# Patient Record
Sex: Female | Born: 1985
Health system: Southern US, Community
[De-identification: ages and names within clinical notes are randomized; demographics above are authoritative.]

## PROBLEM LIST (undated history)

## (undated) ENCOUNTER — Inpatient Hospital Stay (HOSPITAL_COMMUNITY): Payer: Self-pay

## (undated) DIAGNOSIS — D329 Benign neoplasm of meninges, unspecified: Secondary | ICD-10-CM

## (undated) DIAGNOSIS — F419 Anxiety disorder, unspecified: Secondary | ICD-10-CM

## (undated) DIAGNOSIS — D179 Benign lipomatous neoplasm, unspecified: Secondary | ICD-10-CM

## (undated) DIAGNOSIS — G40909 Epilepsy, unspecified, not intractable, without status epilepticus: Secondary | ICD-10-CM

## (undated) DIAGNOSIS — Z98891 History of uterine scar from previous surgery: Secondary | ICD-10-CM

## (undated) DIAGNOSIS — M25472 Effusion, left ankle: Secondary | ICD-10-CM

## (undated) DIAGNOSIS — R1902 Left upper quadrant abdominal swelling, mass and lump: Secondary | ICD-10-CM

## (undated) DIAGNOSIS — F32A Depression, unspecified: Secondary | ICD-10-CM

## (undated) DIAGNOSIS — M2550 Pain in unspecified joint: Secondary | ICD-10-CM

## (undated) DIAGNOSIS — Z91018 Allergy to other foods: Secondary | ICD-10-CM

## (undated) DIAGNOSIS — Z800 Family history of malignant neoplasm of digestive organs: Secondary | ICD-10-CM

## (undated) DIAGNOSIS — R51 Headache: Secondary | ICD-10-CM

## (undated) DIAGNOSIS — M549 Dorsalgia, unspecified: Secondary | ICD-10-CM

## (undated) DIAGNOSIS — R569 Unspecified convulsions: Secondary | ICD-10-CM

## (undated) DIAGNOSIS — K5900 Constipation, unspecified: Secondary | ICD-10-CM

## (undated) DIAGNOSIS — K219 Gastro-esophageal reflux disease without esophagitis: Secondary | ICD-10-CM

## (undated) DIAGNOSIS — Z803 Family history of malignant neoplasm of breast: Secondary | ICD-10-CM

## (undated) DIAGNOSIS — R519 Headache, unspecified: Secondary | ICD-10-CM

## (undated) DIAGNOSIS — R0602 Shortness of breath: Secondary | ICD-10-CM

## (undated) DIAGNOSIS — M25471 Effusion, right ankle: Secondary | ICD-10-CM

## (undated) DIAGNOSIS — R5383 Other fatigue: Secondary | ICD-10-CM

## (undated) DIAGNOSIS — M25475 Effusion, left foot: Secondary | ICD-10-CM

## (undated) HISTORY — DX: Effusion, right ankle: M25.475

## (undated) HISTORY — DX: Allergy to other foods: Z91.018

## (undated) HISTORY — DX: Dorsalgia, unspecified: M54.9

## (undated) HISTORY — DX: Headache, unspecified: R51.9

## (undated) HISTORY — DX: Left upper quadrant abdominal swelling, mass and lump: R19.02

## (undated) HISTORY — DX: Gastro-esophageal reflux disease without esophagitis: K21.9

## (undated) HISTORY — DX: Epilepsy, unspecified, not intractable, without status epilepticus: G40.909

## (undated) HISTORY — DX: Benign neoplasm of meninges, unspecified: D32.9

## (undated) HISTORY — DX: Constipation, unspecified: K59.00

## (undated) HISTORY — DX: Benign lipomatous neoplasm, unspecified: D17.9

## (undated) HISTORY — DX: Effusion, left ankle: M25.472

## (undated) HISTORY — DX: Pain in unspecified joint: M25.50

## (undated) HISTORY — PX: BRAIN SURGERY: SHX531

## (undated) HISTORY — DX: Headache: R51

## (undated) HISTORY — DX: Anxiety disorder, unspecified: F41.9

## (undated) HISTORY — DX: Shortness of breath: R06.02

## (undated) HISTORY — DX: Unspecified convulsions: R56.9

## (undated) HISTORY — DX: History of uterine scar from previous surgery: Z98.891

## (undated) HISTORY — DX: Family history of malignant neoplasm of breast: Z80.3

## (undated) HISTORY — DX: Family history of malignant neoplasm of digestive organs: Z80.0

## (undated) HISTORY — DX: Depression, unspecified: F32.A

## (undated) HISTORY — DX: Effusion, right ankle: M25.471

## (undated) HISTORY — DX: Other fatigue: R53.83

---
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
## 2007-07-20 ENCOUNTER — Emergency Department (HOSPITAL_COMMUNITY): Admission: EM | Admit: 2007-07-20 | Discharge: 2007-07-21 | Payer: Self-pay | Admitting: Emergency Medicine

## 2007-09-12 ENCOUNTER — Emergency Department (HOSPITAL_COMMUNITY): Admission: EM | Admit: 2007-09-12 | Discharge: 2007-09-12 | Payer: Self-pay | Admitting: Emergency Medicine

## 2007-10-08 ENCOUNTER — Emergency Department (HOSPITAL_COMMUNITY): Admission: EM | Admit: 2007-10-08 | Discharge: 2007-10-08 | Payer: Self-pay | Admitting: Emergency Medicine

## 2008-02-23 ENCOUNTER — Other Ambulatory Visit: Admission: RE | Admit: 2008-02-23 | Discharge: 2008-02-23 | Payer: Self-pay | Admitting: Family Medicine

## 2008-06-15 ENCOUNTER — Emergency Department (HOSPITAL_COMMUNITY): Admission: EM | Admit: 2008-06-15 | Discharge: 2008-06-15 | Payer: Self-pay | Admitting: Emergency Medicine

## 2008-08-29 ENCOUNTER — Other Ambulatory Visit: Admission: RE | Admit: 2008-08-29 | Discharge: 2008-08-29 | Payer: Self-pay | Admitting: Family Medicine

## 2009-03-11 ENCOUNTER — Emergency Department (HOSPITAL_COMMUNITY): Admission: EM | Admit: 2009-03-11 | Discharge: 2009-03-11 | Payer: Self-pay | Admitting: Emergency Medicine

## 2009-03-15 ENCOUNTER — Emergency Department (HOSPITAL_COMMUNITY): Admission: EM | Admit: 2009-03-15 | Discharge: 2009-03-15 | Payer: Self-pay | Admitting: Emergency Medicine

## 2009-04-13 ENCOUNTER — Emergency Department (HOSPITAL_COMMUNITY): Admission: EM | Admit: 2009-04-13 | Discharge: 2009-04-13 | Payer: Self-pay | Admitting: Emergency Medicine

## 2009-06-15 ENCOUNTER — Emergency Department (HOSPITAL_COMMUNITY): Admission: EM | Admit: 2009-06-15 | Discharge: 2009-06-15 | Payer: Self-pay | Admitting: Emergency Medicine

## 2009-12-19 ENCOUNTER — Emergency Department (HOSPITAL_COMMUNITY)
Admission: EM | Admit: 2009-12-19 | Discharge: 2009-12-19 | Payer: Self-pay | Source: Home / Self Care | Admitting: Family Medicine

## 2010-02-24 ENCOUNTER — Ambulatory Visit: Payer: Self-pay | Admitting: Gynecology

## 2010-02-24 ENCOUNTER — Inpatient Hospital Stay (HOSPITAL_COMMUNITY): Admission: AD | Admit: 2010-02-24 | Discharge: 2010-02-24 | Payer: Self-pay | Admitting: Obstetrics and Gynecology

## 2010-05-19 ENCOUNTER — Inpatient Hospital Stay (HOSPITAL_COMMUNITY): Admission: AD | Admit: 2010-05-19 | Discharge: 2010-05-19 | Payer: Self-pay | Admitting: Obstetrics

## 2010-09-06 ENCOUNTER — Inpatient Hospital Stay (HOSPITAL_COMMUNITY)
Admission: AD | Admit: 2010-09-06 | Discharge: 2010-09-06 | Payer: Self-pay | Source: Home / Self Care | Attending: Obstetrics | Admitting: Obstetrics

## 2010-09-08 ENCOUNTER — Inpatient Hospital Stay (HOSPITAL_COMMUNITY)
Admission: AD | Admit: 2010-09-08 | Discharge: 2010-09-14 | Payer: Self-pay | Source: Home / Self Care | Attending: Obstetrics and Gynecology | Admitting: Obstetrics and Gynecology

## 2010-09-14 ENCOUNTER — Encounter: Admission: RE | Admit: 2010-09-14 | Payer: Self-pay | Source: Home / Self Care | Admitting: Certified Nurse Midwife

## 2010-09-14 LAB — CBC
HCT: 39.7 % (ref 36.0–46.0)
HCT: 33.7 % — ABNORMAL LOW (ref 36.0–46.0)
HCT: 34.6 % — ABNORMAL LOW (ref 36.0–46.0)
HCT: 31.7 % — ABNORMAL LOW (ref 36.0–46.0)
Hemoglobin: 13.5 g/dL (ref 12.0–15.0)
Hemoglobin: 11.3 g/dL — ABNORMAL LOW (ref 12.0–15.0)
Hemoglobin: 11.5 g/dL — ABNORMAL LOW (ref 12.0–15.0)
Hemoglobin: 10.5 g/dL — ABNORMAL LOW (ref 12.0–15.0)
MCH: 31.8 pg (ref 26.0–34.0)
MCH: 31.7 pg (ref 26.0–34.0)
MCH: 31 pg (ref 26.0–34.0)
MCH: 30.9 pg (ref 26.0–34.0)
MCHC: 34 g/dL (ref 30.0–36.0)
MCHC: 33.5 g/dL (ref 30.0–36.0)
MCHC: 33.2 g/dL (ref 30.0–36.0)
MCHC: 33.1 g/dL (ref 30.0–36.0)
MCV: 93.6 fL (ref 78.0–100.0)
MCV: 94.7 fL (ref 78.0–100.0)
MCV: 93.3 fL (ref 78.0–100.0)
MCV: 93.2 fL (ref 78.0–100.0)
Platelets: 352 10*3/uL (ref 150–400)
Platelets: 276 10*3/uL (ref 150–400)
Platelets: 315 10*3/uL (ref 150–400)
Platelets: 358 10*3/uL (ref 150–400)
RBC: 4.24 MIL/uL (ref 3.87–5.11)
RBC: 3.56 MIL/uL — ABNORMAL LOW (ref 3.87–5.11)
RBC: 3.71 MIL/uL — ABNORMAL LOW (ref 3.87–5.11)
RBC: 3.4 MIL/uL — ABNORMAL LOW (ref 3.87–5.11)
RDW: 14 % (ref 11.5–15.5)
RDW: 14 % (ref 11.5–15.5)
RDW: 14.1 % (ref 11.5–15.5)
RDW: 14 % (ref 11.5–15.5)
WBC: 9.6 10*3/uL (ref 4.0–10.5)
WBC: 10.9 10*3/uL — ABNORMAL HIGH (ref 4.0–10.5)
WBC: 16 10*3/uL — ABNORMAL HIGH (ref 4.0–10.5)
WBC: 10.9 10*3/uL — ABNORMAL HIGH (ref 4.0–10.5)

## 2010-09-14 LAB — URINE CULTURE
Colony Count: NO GROWTH
Culture  Setup Time: 201201121915
Culture: NO GROWTH
Special Requests: NEGATIVE

## 2010-09-14 LAB — URINALYSIS, MICROSCOPIC ONLY
Bilirubin Urine: NEGATIVE
Ketones, ur: NEGATIVE mg/dL
Nitrite: NEGATIVE
Protein, ur: NEGATIVE mg/dL
Specific Gravity, Urine: 1.01 (ref 1.005–1.030)
Urine Glucose, Fasting: NEGATIVE mg/dL
Urobilinogen, UA: 0.2 mg/dL (ref 0.0–1.0)
pH: 7 (ref 5.0–8.0)

## 2010-09-14 LAB — DIFFERENTIAL
Basophils Absolute: 0 10*3/uL (ref 0.0–0.1)
Basophils Relative: 0 % (ref 0–1)
Eosinophils Absolute: 0.2 10*3/uL (ref 0.0–0.7)
Eosinophils Relative: 2 % (ref 0–5)
Lymphocytes Relative: 12 % (ref 12–46)
Lymphs Abs: 1.3 10*3/uL (ref 0.7–4.0)
Monocytes Absolute: 1.1 10*3/uL — ABNORMAL HIGH (ref 0.1–1.0)
Monocytes Relative: 10 % (ref 3–12)
Neutro Abs: 8.4 10*3/uL — ABNORMAL HIGH (ref 1.7–7.7)
Neutrophils Relative %: 77 % (ref 43–77)

## 2010-09-14 LAB — VANCOMYCIN, TROUGH: Vancomycin Tr: 6.8 ug/mL — ABNORMAL LOW (ref 10.0–20.0)

## 2010-09-14 LAB — GENTAMICIN LEVEL, RANDOM: Gentamicin Rm: 1.1 ug/mL

## 2010-09-14 LAB — CREATININE, SERUM
Creatinine, Ser: 0.68 mg/dL (ref 0.4–1.2)
GFR calc Af Amer: >60 mL/min (ref >60)
GFR calc non Af Amer: >60 mL/min (ref >60)

## 2010-09-14 LAB — RPR: RPR Ser Ql: NONREACTIVE

## 2010-09-16 LAB — CBC
HCT: 30.9 % — ABNORMAL LOW (ref 36.0–46.0)
Hemoglobin: 10.4 g/dL — ABNORMAL LOW (ref 12.0–15.0)
MCH: 31.1 pg (ref 26.0–34.0)
MCHC: 33.7 g/dL (ref 30.0–36.0)
MCV: 92.5 fL (ref 78.0–100.0)
Platelets: 386 10*3/uL (ref 150–400)
RBC: 3.34 MIL/uL — ABNORMAL LOW (ref 3.87–5.11)
RDW: 13.8 % (ref 11.5–15.5)
WBC: 9 10*3/uL (ref 4.0–10.5)

## 2010-09-16 LAB — DIFFERENTIAL
Basophils Absolute: 0 10*3/uL (ref 0.0–0.1)
Basophils Relative: 0 % (ref 0–1)
Eosinophils Absolute: 0.2 10*3/uL (ref 0.0–0.7)
Eosinophils Relative: 2 % (ref 0–5)
Lymphocytes Relative: 22 % (ref 12–46)
Lymphs Abs: 2 10*3/uL (ref 0.7–4.0)
Monocytes Absolute: 1 10*3/uL (ref 0.1–1.0)
Monocytes Relative: 11 % (ref 3–12)
Neutro Abs: 5.9 10*3/uL (ref 1.7–7.7)
Neutrophils Relative %: 66 % (ref 43–77)

## 2010-10-22 NOTE — Discharge Summary (Signed)
NAME:  Kendra [name], Kendra [name]                 ACCOUNT NO.:  [id number]  MEDICAL RECORD NO.:  [id number]          PATIENT TYPE:  INP  LOCATION:  9102                          FACILITY:  [facility]  PHYSICIAN:  Darryl Nestle, MD     DATE OF BIRTH:  15-Nov-1985  DATE OF ADMISSION:  09/08/2010 DATE OF DISCHARGE:  09/14/2010                              DISCHARGE SUMMARY   ADMISSION DIAGNOSIS:  A 41 weeks' pregnancy and latent labor.  DISCHARGE DIAGNOSIS:  Status post primary cesarean section for arrest of dilatation, postop #5 endometritis.  PATIENT HISTORY:  The patient is a gravida 1, para 0 at 41 weeks and 1- day gestation with an EDC of August 31, 2010.  Prenatal care at [facility] since 9 weeks, primary was Marlinda Mike, CNM.  PRENATAL LABS:  A positive, rubella immune, GBS negative, HIV negative, RPR negative, hepatis negative, and 1-hour GTT normal.  Prenatal course was complicated by obesity and postdates.  MEDICAL-SURGICAL HISTORY:  Obesity and asthma.  ALLERGIES:  ASA, PENICILLIN, IODINE, ERYTHROMYCIN, and MOTRIN.  CURRENT MEDICATIONS:  Prenatal vitamins, Tylenol, and Tums.  PRESENTATION:  The patient presented with intermittent contractions and reactive fetal heart tones, positive brown vaginal discharge and a cervical dilatation of 1-2, 70, and -2 and vertex.  Admit labs were WBC is 11.6, hemoglobin 13.2, hematocrit 38.3, platelets 161, and RPR was nonreactive.  INTRAPARTUM COURSE SUMMARY:  The patient was admitted for augmentation of labor with cervical balloon at 2200 on September 08, 2010.  At 5:30 a.m. on January 11, the patient was noted to have clear fluid leaking, the patient underwent AROM of forebag at 8:30 that morning with an epidural placement, IUPC, and Pitocin augmentation.  At 1400, vital signs were stable.  The patient was afebrile and fetal heart rate reassuring.  No cervical change despite adequate contractions documented x4 hours.  The patient was also found to have thin  meconium.  Decision was made to progress with a C-section.  Delivery, the patient was delivered via primary C-section on September 09, 2010, by Dr. Juliene Pina. Indication was arrest of dilatation at 7 cm.  The patient was delivered of a female infant 7 pounds 3 ounces, Apgars 9 and 9.  Newborn was transferred to regular nursery.  Postop course was complicated by a hectic fever curve, the patient was treated with double antibiotics x2 days with an addition of the third antibiotics for 2 more days and switched to oral antibiotics on last day before discharge.  Postop labs, WBC is 10.9, hemoglobin 11.3, hematocrit 33.7, platelets 276, maximum leukocytosis was 16.0 on day 2.  On discharge day, WBCs were 9.0.  Vital signs were stable at discharge.  The patient was afebrile at time of discharge, T-max was 100.4 in the previous 24 hours.  Physical exam was normal with exception of dependent edema generalized, wound was approximated with staples.  No erythema and no ecchymosis.  Newborn was breastfed throughout hospital stay. DISCHARGE CARE AND INSTRUCTIONS:  The patient was discharged home with improved status, staples in place to be removed on postop day #7 at [facility].  DIET:  Regular.  ACTIVITY:  Postop restrictions x2 weeks with weight lifting restrictions.  INSTRUCTIONS:  Per [facility] booklet.  DISCHARGE MEDICATIONS: 1. Prenatal vitamins 1 tablet daily. 2. Tylox 1-2 tablets p.o. q.4-6 h. p.r.n. 3. Augmentin 500 mg q.8 h. x7 days. 4. Hydrochlorothiazide 12.5 mg daily x2 days.  Follow up in 2 days for staple removal and 6 weeks for postpartum care.    ______________________________ Arlan Organ, CNM   ______________________________ Darryl Nestle, MD    DP/MEDQ  D:  09/14/2010  T:  09/15/2010  Job:  [id number]  Electronically Signed by Arlan Organ CNM on 10/12/2010 01:05:12 PM Electronically Signed by Susa Day Adelee Hannula  on 10/22/2010 10:36:34 AM

## 2010-11-16 LAB — URINALYSIS, ROUTINE W REFLEX MICROSCOPIC
Bilirubin Urine: NEGATIVE
Glucose, UA: NEGATIVE mg/dL
Hgb urine dipstick: NEGATIVE
Ketones, ur: NEGATIVE mg/dL
Nitrite: NEGATIVE
Protein, ur: NEGATIVE mg/dL
Specific Gravity, Urine: 1.02 (ref 1.005–1.030)
Urobilinogen, UA: 0.2 mg/dL (ref 0.0–1.0)
pH: 7 (ref 5.0–8.0)

## 2010-11-16 LAB — URINE MICROSCOPIC-ADD ON

## 2010-11-17 LAB — POCT URINALYSIS DIP (DEVICE)
Bilirubin Urine: NEGATIVE
Glucose, UA: NEGATIVE mg/dL
Ketones, ur: NEGATIVE mg/dL
Nitrite: NEGATIVE
Protein, ur: NEGATIVE mg/dL
Specific Gravity, Urine: >=1.03 (ref 1.005–1.030)
Urobilinogen, UA: 1 mg/dL (ref 0.0–1.0)
pH: 6 (ref 5.0–8.0)

## 2010-11-17 LAB — POCT PREGNANCY, URINE: Preg Test, Ur: NEGATIVE

## 2010-12-05 LAB — POCT PREGNANCY, URINE: Preg Test, Ur: NEGATIVE

## 2010-12-06 ENCOUNTER — Emergency Department (HOSPITAL_COMMUNITY)
Admission: EM | Admit: 2010-12-06 | Discharge: 2010-12-07 | Disposition: A | Discharge status: Home / Self Care | Payer: 59 | Attending: Emergency Medicine | Admitting: Emergency Medicine

## 2010-12-06 DIAGNOSIS — L509 Urticaria, unspecified: Secondary | ICD-10-CM | POA: Insufficient documentation

## 2010-12-06 DIAGNOSIS — J45909 Unspecified asthma, uncomplicated: Secondary | ICD-10-CM | POA: Insufficient documentation

## 2010-12-06 DIAGNOSIS — L2989 Other pruritus: Secondary | ICD-10-CM | POA: Insufficient documentation

## 2010-12-06 DIAGNOSIS — R21 Rash and other nonspecific skin eruption: Secondary | ICD-10-CM | POA: Insufficient documentation

## 2010-12-06 DIAGNOSIS — L272 Dermatitis due to ingested food: Secondary | ICD-10-CM | POA: Insufficient documentation

## 2010-12-06 DIAGNOSIS — L298 Other pruritus: Secondary | ICD-10-CM | POA: Insufficient documentation

## 2010-12-06 LAB — PREGNANCY, URINE: Preg Test, Ur: NEGATIVE

## 2011-01-08 ENCOUNTER — Other Ambulatory Visit: Payer: Self-pay | Admitting: Family Medicine

## 2011-01-08 ENCOUNTER — Ambulatory Visit
Admission: RE | Admit: 2011-01-08 | Discharge: 2011-01-08 | Disposition: A | Discharge status: Home / Self Care | Payer: 59 | Source: Ambulatory Visit | Attending: Family Medicine | Admitting: Family Medicine

## 2011-01-08 DIAGNOSIS — R52 Pain, unspecified: Secondary | ICD-10-CM

## 2011-05-31 LAB — POCT PREGNANCY, URINE: Preg Test, Ur: NEGATIVE

## 2011-08-31 HISTORY — PX: LYMPH NODE DISSECTION: SHX5087

## 2011-11-24 ENCOUNTER — Encounter: Payer: Self-pay | Admitting: Obstetrics and Gynecology

## 2011-12-03 ENCOUNTER — Encounter: Payer: 59 | Admitting: Registered Nurse

## 2011-12-03 ENCOUNTER — Encounter: Payer: Self-pay | Admitting: Obstetrics and Gynecology

## 2011-12-13 ENCOUNTER — Encounter: Payer: Self-pay | Admitting: Obstetrics and Gynecology

## 2011-12-13 ENCOUNTER — Ambulatory Visit (INDEPENDENT_AMBULATORY_CARE_PROVIDER_SITE_OTHER): Payer: 59 | Admitting: Obstetrics and Gynecology

## 2011-12-13 VITALS — BP 104/72 | Temp 99.0°F | Ht 60.5 in | Wt 199.0 lb

## 2011-12-13 DIAGNOSIS — Z124 Encounter for screening for malignant neoplasm of cervix: Secondary | ICD-10-CM

## 2011-12-13 DIAGNOSIS — A749 Chlamydial infection, unspecified: Secondary | ICD-10-CM

## 2011-12-13 DIAGNOSIS — Z9889 Other specified postprocedural states: Secondary | ICD-10-CM

## 2011-12-13 DIAGNOSIS — Z01419 Encounter for gynecological examination (general) (routine) without abnormal findings: Secondary | ICD-10-CM

## 2011-12-13 DIAGNOSIS — R198 Other specified symptoms and signs involving the digestive system and abdomen: Secondary | ICD-10-CM

## 2011-12-13 DIAGNOSIS — Z98891 History of uterine scar from previous surgery: Secondary | ICD-10-CM

## 2011-12-13 HISTORY — PX: ABDOMINAL SURGERY: SHX537

## 2011-12-13 NOTE — Patient Instructions (Signed)
Follow up as scheduled with general surgeon.  If you can't make your appointment, please call and reschedule/cancel

## 2011-12-13 NOTE — Progress Notes (Signed)
Subjective:    Kendra [name] is a 26 y.o. female, G1P1001, who presents for an annual exam. Patient complains of a several year history of her right abdomen having a "big lump".  States that since her delivery in January 2012 the area has gotten bigger.  Denies any pain associated with this area or other complaints.    History   Social History  . Marital Status: Single    Spouse Name: N/A    Number of Children: N/A  . Years of Education: N/A   Social History Main Topics  . Smoking status: Current Some Day Smoker    Types: Cigarettes  . Smokeless tobacco: Never Used  . Alcohol Use: Yes     occasionally  . Drug Use: Yes    Special: Marijuana  . Sexually Active: Yes -- Female partner(s)    Birth Control/ Protection: Condom   Other Topics Concern  . None   Social History Narrative  . None    Last mammogram: not applicable   Last pap: was normal  2012  Menstrual cycle:   LMP: Patient's last menstrual period was 12/10/2011.  per patient "normal" flow and minimal cramping  Review of Systems Pertinent items are noted in HPI. Denies pelvic pain, uti symptoms, vaginitis symptoms, irregular bleeding, menopausal symptoms or rectal bleeding   Objective:    BP 104/72  Temp(Src) 99 F (37.2 C) (Oral)  Ht 5' 0.5" (1.537 m)  Wt 199 lb (90.266 kg)  BMI 38.22 kg/m2  LMP 12/10/2011 Weight:  Wt Readings from Last 1 Encounters:  12/13/11 199 lb (90.266 kg)   BMI: Body mass index is 38.22 kg/(m^2). General Appearance: Alert, appropriate appearance for age. No acute distress HEENT: Grossly normal Neck / Thyroid: Supple, no masses, nodes or enlargement Lungs: clear to auscultation bilaterally Back: No CVA tenderness Breast Exam:No masses or nodes.No dimpling, nipple retraction or discharge. Cardiovascular: Regular rate and rhythm. S1, S2, no murmur Gastrointestinal: Soft, non-tender, no masses or organomegaly, however there is markedly noticeable asymmetry of right side of abdomen  compared with the left, especially when the patient stands;  not discernable mass palpated. Pelvic Exam: External genitalia: normal general appearance Vaginal: normal rugae Cervix: normal appearance Adnexa: normal bimanual exam Uterus: normal single, nontender Exam limited by body habitus Rectovaginal: not indicated Lymphatic Exam: Non-palpable nodes in neck, clavicular, axillary, or inguinal regions Skin: no rash or abnormalities Extremities: no redness or tenderness in the calves or thighs, no edema,  negative Homan's  Neurologic: grossly normal Psychiatric: Alert and oriented, appropriate affect.     Assessment:    Normal gyn exam Right Abdominal Asymmetry    Plan:  Refer to general surgeon for evaluation of abdominal asymmetry  pap smear return annually or prn       Kevin Mario,ELMIRAPA-C

## 2011-12-15 ENCOUNTER — Telehealth: Payer: Self-pay

## 2011-12-15 LAB — PAP IG, CT-NG, RFX HPV ASCU: GC Probe Amp: NEGATIVE

## 2011-12-15 MED ORDER — AZITHROMYCIN 500 MG PO TABS: 1000.0000 mg | ORAL_TABLET | Freq: Once | ORAL | Status: AC

## 2011-12-15 NOTE — Telephone Encounter (Signed)
Message copied by Winfred Leeds on Wed Dec 15, 2011  1:34 PM ------      Message from: Henreitta Leber      Created: Tue Dec 14, 2011  5:46 PM      Regarding: STD Protocol       Patient with a  + chlamydia.  Please initiate STD Protocol. Patient may be treated with Zithromzx 500 mg 2 tablets po stat  no refills.

## 2011-12-15 NOTE — Telephone Encounter (Signed)
CALL PT ABOUT POSITIVE  CT. WENT OVER  STD PROTOCOL WITH PT AND SCHEDULED APPOINTMENT FOR MAY 8 AT 9:00 AM TO DO TOC FOR CT. PT VOICED UNDERSTANDING. WILL CALL IN RX FOR PT.

## 2011-12-15 NOTE — Telephone Encounter (Signed)
Lm for pt to call back

## 2011-12-27 ENCOUNTER — Encounter: Payer: Self-pay | Admitting: Obstetrics and Gynecology

## 2011-12-30 ENCOUNTER — Other Ambulatory Visit: Payer: Self-pay | Admitting: Plastic Surgery

## 2011-12-30 HISTORY — PX: BREAST SURGERY: SHX581

## 2012-01-05 ENCOUNTER — Encounter: Payer: 59 | Admitting: Obstetrics and Gynecology

## 2012-01-11 ENCOUNTER — Encounter: Payer: Self-pay | Admitting: Obstetrics and Gynecology

## 2012-01-11 ENCOUNTER — Ambulatory Visit (INDEPENDENT_AMBULATORY_CARE_PROVIDER_SITE_OTHER): Payer: 59 | Admitting: Obstetrics and Gynecology

## 2012-01-11 VITALS — BP 100/70 | HR 74 | Ht 61.0 in | Wt 197.0 lb

## 2012-01-11 DIAGNOSIS — Z309 Encounter for contraceptive management, unspecified: Secondary | ICD-10-CM

## 2012-01-11 DIAGNOSIS — Z113 Encounter for screening for infections with a predominantly sexual mode of transmission: Secondary | ICD-10-CM

## 2012-01-11 MED ORDER — ETONOGESTREL-ETHINYL ESTRADIOL 0.12-0.015 MG/24HR VA RING: VAGINAL_RING | VAGINAL | Status: DC

## 2012-01-11 NOTE — Progress Notes (Signed)
25 YO for TOC for positive chlamydia. and wants to be tested for other STDs.  Patient also wants to try the NuvaRing-reviewed MOA, effectiveness, use and nuances.   O: Pelvic: EGBUS-wnl, vagina-small blood, Nuva Ring inserted without difficulty, cervix-normal, uterus-normal size though exam limited by habitus, adnexae-no tenderness or masses   A: CT- Test of cure     STD testing     Need for Contraception  P: Nuvaring #1  1 pv for 21 of 28 days 11 refill      Nuvaring Brochure given      Reviewed placement of Nuvaring and schedule for     insertion along with MOA, effectiveness, side effects     and risks to include VTE events. Patient has been on     BCPs before without difficulty.      STD tests-pending;  GC/CT-pending

## 2012-01-12 LAB — HEPATITIS B SURFACE ANTIGEN: Hepatitis B Surface Ag: NEGATIVE

## 2012-01-12 LAB — GC/CHLAMYDIA PROBE AMP, GENITAL
Chlamydia, DNA Probe: NEGATIVE
GC Probe Amp, Genital: NEGATIVE

## 2012-03-09 ENCOUNTER — Telehealth: Payer: Self-pay | Admitting: Obstetrics and Gynecology

## 2012-03-09 NOTE — Telephone Encounter (Signed)
TRIAGE/EPIC °

## 2012-03-10 ENCOUNTER — Other Ambulatory Visit: Payer: Self-pay | Admitting: Obstetrics and Gynecology

## 2012-03-10 NOTE — Telephone Encounter (Signed)
Tc to pt regarding msg, lm on vm to call back. 

## 2012-03-10 NOTE — Telephone Encounter (Signed)
TRIAGE/EPIC/RX

## 2012-03-10 NOTE — Telephone Encounter (Signed)
TC TO PT REGARDING RX REQUEST. INFORMED PT THAT THE RX IS ALREADY AT PT PHARM[facility]Y FOR PICK UP. PT VOICED UNDERSTANDING.

## 2012-05-04 ENCOUNTER — Other Ambulatory Visit: Payer: Self-pay | Admitting: Family Medicine

## 2012-05-04 DIAGNOSIS — D1739 Benign lipomatous neoplasm of skin and subcutaneous tissue of other sites: Secondary | ICD-10-CM

## 2012-05-05 ENCOUNTER — Ambulatory Visit
Admission: RE | Admit: 2012-05-05 | Discharge: 2012-05-05 | Disposition: A | Discharge status: Home / Self Care | Payer: 59 | Source: Ambulatory Visit | Attending: Family Medicine | Admitting: Family Medicine

## 2012-05-05 DIAGNOSIS — D1739 Benign lipomatous neoplasm of skin and subcutaneous tissue of other sites: Secondary | ICD-10-CM

## 2012-05-12 ENCOUNTER — Other Ambulatory Visit: Payer: Self-pay | Admitting: Family Medicine

## 2012-05-12 DIAGNOSIS — D320 Benign neoplasm of cerebral meninges: Secondary | ICD-10-CM

## 2012-05-23 ENCOUNTER — Ambulatory Visit
Admission: RE | Admit: 2012-05-23 | Discharge: 2012-05-23 | Disposition: A | Discharge status: Home / Self Care | Payer: 59 | Source: Ambulatory Visit | Attending: Family Medicine | Admitting: Family Medicine

## 2012-05-23 DIAGNOSIS — D320 Benign neoplasm of cerebral meninges: Secondary | ICD-10-CM

## 2012-05-23 MED ORDER — GADOBENATE DIMEGLUMINE 529 MG/ML IV SOLN
18.0000 mL | Freq: Once | INTRAVENOUS | Status: AC | PRN
  Administered 2012-05-23: 18 mL via INTRAVENOUS

## 2012-05-24 ENCOUNTER — Encounter (INDEPENDENT_AMBULATORY_CARE_PROVIDER_SITE_OTHER): Payer: Self-pay | Admitting: Surgery

## 2012-05-24 ENCOUNTER — Ambulatory Visit (INDEPENDENT_AMBULATORY_CARE_PROVIDER_SITE_OTHER): Payer: 59 | Admitting: Surgery

## 2012-05-24 VITALS — BP 124/78 | HR 70 | Temp 97.6°F | Resp 14 | Ht 60.0 in | Wt 194.0 lb

## 2012-05-24 DIAGNOSIS — E669 Obesity, unspecified: Secondary | ICD-10-CM | POA: Insufficient documentation

## 2012-05-24 DIAGNOSIS — D171 Benign lipomatous neoplasm of skin and subcutaneous tissue of trunk: Secondary | ICD-10-CM | POA: Insufficient documentation

## 2012-05-24 DIAGNOSIS — R1902 Left upper quadrant abdominal swelling, mass and lump: Secondary | ICD-10-CM

## 2012-05-24 HISTORY — DX: Left upper quadrant abdominal swelling, mass and lump: R19.02

## 2012-05-24 NOTE — Progress Notes (Signed)
Subjective:     Patient ID: Kendra [name], female   DOB: 1986-06-04, 26 y.o.   MRN: [id number]  HPI  Kendra [name]  Oct 29, 1985 [id number]  Patient Care Team: Maurice Small, MD as PCP - General (Family Medicine)  This patient is a 26 y.o.female who presents today for surgical evaluation at the request of Dr. [name].   Reason for visit: Abdominal wall masses.  Possible lipomas.  Pleasant obese female with moderate panniculus.  She has felt lumps on her abdomen in two places for some time.  Left upper abdomen.  She's noted that the fold below her bellybutton is larger and lower on the right than left.  She wonders if there is a mass.  These areas have gotten larger.  They can be sore/uncomfortable at times.  No severe sharp pain.  No nausea or vomiting.  No history of fall or trauma.  No history of other lumps or bumps.  Had an ultrasound done which showed no pain and no discrete masses.  Never had a CT scan done of her abdomen.  Her primary care physician thought she felt something but ultrasound did not see anything.  She was sent to me to see if there is anything of concern.  Patient Active Problem List  Diagnosis  . Chlamydia infection  . 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 Abdominal mass, LUQ (left upper quadrant), probable lipoma  . 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 Asthma   . H/O: C-section   . Lipoma     Past Surgical History  Procedure Date  . Cesarean section 09/09/2010  . Breast surgery 12/30/11    breast reduction    History   Social History  . Marital Status: Single    Spouse Name: N/A    Number of Children: N/A  . Years of Education: N/A   Occupational History  . Not on file.   Social History Main Topics  . Smoking status: Former Smoker    Types: Cigarettes  . Smokeless tobacco: Never Used  . Alcohol Use: No  . Drug Use: No  . Sexually Active: Yes -- Female partner(s)    Birth Control/ Protection:  Condom   Other Topics Concern  . Not on file   Social History Narrative  . No narrative on file    Family History  Problem Relation Age of Onset  . Kidney disease Mother     kidney stones  . Heart disease Maternal Uncle   . Stroke Maternal Uncle   . Cancer Maternal Grandmother     breast  . Cancer Maternal Grandfather   . Deep vein thrombosis Maternal Grandfather     in throat  . Diabetes Paternal Grandfather   . Cancer Maternal Aunt     breast    Current Outpatient Prescriptions  Medication Sig Dispense Refill  . albuterol (PROVENTIL) (5 MG/ML) 0.5% nebulizer solution Take 2.5 mg by nebulization every 6 (six) hours as needed.         Allergies  Allergen Reactions  . Penicillins Hives and Swelling    Swelling of throat  . Asa Buff (Mag (Buffered Aspirin) Swelling  . Iodine   . Peanut-Containing Drug Products Hives and Swelling    Pt is allergic to peanut oil  . Shellfish Allergy Hives and Swelling    Pt throat swells    BP 124/78  Pulse 70  Temp 97.6 F (36.4 C) (Temporal)  Resp  14  Ht 5' (1.524 m)  Wt 194 lb (87.998 kg)  BMI 37.89 kg/m2  Mr Laqueta Jean Wo Contrast  05/23/2012  *RADIOLOGY REPORT*  Clinical Data:  Previous history of intracranial meningioma. Questionable history of CVA 5 years ago.  MRI HEAD WITHOUT AND WITH CONTRAST  Technique:  Multiplanar, multiecho pulse sequences of the brain and surrounding structures were obtained according to standard protocol without and with intravenous contrast  Contrast: 18mL MULTIHANCE GADOBENATE DIMEGLUMINE 529 MG/ML IV SOLN  Comparison: CT scan of the brain of 10/03/2006.  Findings:  Gray-white matter differentiation is normal.  Partial empty sella is seen.  Clival signal is mildly heterogeneous.  Cerebellar tonsils are just below level of the foramen magnum.  The odontoid process, the predental space and the prevertebral soft tissues are within normal limits.  Mild prominence of the soft tissues in the nasopharynx  probably represents lymphoid tissue.  A few scattered subcentimeter lymph nodes are seen in the posterior triangle regions bilaterally and in the occipital regions.  No acute diffusion weighted abnormalities seen.  Axial FLAIR and T2-weighted images demonstrate the brain signal to be within normal limits.  Ventricles are normal.  Mastoids are clear.  The internal auditory canals are symmetrical in signal and morphology.  Flow voids are maintained in the major vessels at the cranial skull base.  There is moderate thickening of the  mucosa in  the ethmoid air cells ,the frontal sinuses, the sphenoid sinuses and the maxillary sinuses. Suggestion of possible small mucous retention cyst in the left maxillary sinus.  The visualized orbital contents are grossly normal.  No abnormal blood breakdown products seen on SPGR sequences. The postinfusion images demonstrate a uniformly enhancing soft tissue mass in the anterior cranial fossa,  parafalcine  region extending to the  anterior clinoid process level on the sagittal images.  This measures approximately 23 mm by 19.4 mm by 11.8 mm. No abnormal vasculature  is seen in the immediate vicinity.  The dural venous sinuses are widely patent.  Impression 1.  No evidence of acute ischemia. 2. 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 3.  Major dural venous sinuses are widely patent. 4. Moderate inflammatory pansinusitis. 5.  Comparison with old studies would help with assessment of interval changes.  IMPRESSION:   Original Report Authenticated By: Oneal Grout, M.D.    US Abdomen Complete  05/05/2012  *RADIOLOGY REPORT*  Clinical Data:  Intermittent abdominal pain.  Left upper quadrant mass on exam.  COMPLETE ABDOMINAL ULTRASOUND  Comparison:  CT abdomen pelvis 07/06/2005.  Findings:  Gallbladder:  No gallstones, gallbladder  wall thickening, or pericholecystic fluid.  Common bile duct:  Measures 3 mm, within normal limits.  Liver:  A hyperechoic lesion is seen in the left hepatic lobe, measuring 1.6 x 1.4 x 1.8 cm.  Parenchymal echogenicity is otherwise uniform and normal.  IVC:  Appears normal.  Pancreas:  No focal abnormality seen.  Spleen:  Measures 4.2 cm, negative.  Right Kidney:  Measures 11.0 cm with normal parenchymal echogenicity.  No hydronephrosis.  Left Kidney:  Measures 10.1 cm with normal parenchymal echogenicity.  No hydronephrosis.  Abdominal aorta:  Bifurcation is obscured by bowel gas.  Otherwise, no aneurysm.  Comment:  Dedicated soft tissue sonography of the left side the abdomen was performed, due to a palpable abnormality on physical exam.  There is  no solid or cystic mass.  IMPRESSION:  1.  No findings to explain the patient's given symptoms.  No solid or cystic mass to account for the palpable abnormality on physical exam. 2.  Probable small hemangioma in the liver.   Original Report Authenticated By: Reyes Ivan, M.D.    US Transvaginal Non-ob  05/05/2012  *RADIOLOGY REPORT*  Clinical Data: Pelvic pain.  TRANSABDOMINAL AND TRANSVAGINAL ULTRASOUND OF PELVIS Technique:  Both transabdominal and transvaginal ultrasound examinations of the pelvis were performed. Transabdominal technique was performed for global imaging of the pelvis including uterus, ovaries, adnexal regions, and pelvic cul-de-sac.  It was necessary to proceed with endovaginal exam following the transabdominal exam to visualize the uterus, endometrium, ovaries and adnexal regions.  Comparison:  None  Findings:  Uterus: Measures 9.2 x 5.2 x 5.4 cm.  Parenchymal echogenicity is normal.  Endometrium: Measures 10 mm, within normal limits.  Right ovary:  Measures 4.4 x 2.0 x 3.7 cm, negative. No cystic or solid adnexal mass.  Left ovary: Measures 3.3 x 1.7 x 2.9 cm, negative.  No cystic or solid adnexal mass.  Other findings: Small free fluid.   IMPRESSION:  1.  No uterine, ovarian or adnexal mass. 2.  Small free fluid.   Original Report Authenticated By: Reyes Ivan, M.D.    US Pelvis Complete  05/05/2012  *RADIOLOGY REPORT*  Clinical Data: Pelvic pain.  TRANSABDOMINAL AND TRANSVAGINAL ULTRASOUND OF PELVIS Technique:  Both transabdominal and transvaginal ultrasound examinations of the pelvis were performed. Transabdominal technique was performed for global imaging of the pelvis including uterus, ovaries, adnexal regions, and pelvic cul-de-sac.  It was necessary to proceed with endovaginal exam following the transabdominal exam to visualize the uterus, endometrium, ovaries and adnexal regions.  Comparison:  None  Findings:  Uterus: Measures 9.2 x 5.2 x 5.4 cm.  Parenchymal echogenicity is normal.  Endometrium: Measures 10 mm, within normal limits.  Right ovary:  Measures 4.4 x 2.0 x 3.7 cm, negative. No cystic or solid adnexal mass.  Left ovary: Measures 3.3 x 1.7 x 2.9 cm, negative.  No cystic or solid adnexal mass.  Other findings: Small free fluid.  IMPRESSION:  1.  No uterine, ovarian or adnexal mass. 2.  Small free fluid.   Original Report Authenticated By: Reyes Ivan, M.D.      Review of Systems  Constitutional: Negative for fever, chills, diaphoresis, appetite change and fatigue.  HENT: Negative for ear pain, sore throat, trouble swallowing, neck pain and ear discharge.   Eyes: Negative for photophobia, discharge and visual disturbance.  Respiratory: Negative for cough, choking, chest tightness and shortness of breath.   Cardiovascular: Negative for chest pain and palpitations.  Gastrointestinal: Negative for nausea, vomiting, abdominal pain, diarrhea, constipation, anal bleeding and rectal pain.  Genitourinary: Negative for dysuria, frequency and difficulty urinating.  Musculoskeletal: Negative for myalgias and gait problem.  Skin: Negative for color change, pallor and rash.  Neurological: Negative for dizziness, speech  difficulty, weakness and numbness.  Hematological: Negative for adenopathy.  Psychiatric/Behavioral: Negative for confusion and agitation. The patient is not nervous/anxious.        Objective:   Physical Exam  Constitutional: She is oriented to person, place, and time. She appears well-developed and well-nourished. No distress.  HENT:  Head: Normocephalic.  Mouth/Throat: Oropharynx is clear and moist. No oropharyngeal exudate.  Eyes: Conjunctivae normal and EOM are normal. Pupils are equal, round, and reactive to light. No scleral icterus.  Neck: Normal range of motion. Neck  supple. No tracheal deviation present.  Cardiovascular: Normal rate, regular rhythm and intact distal pulses.   Pulmonary/Chest: Effort normal and breath sounds normal. No respiratory distress. She exhibits no tenderness.  Abdominal: Soft. She exhibits no distension and no mass. There is no tenderness. Hernia confirmed negative in the right inguinal area and confirmed negative in the left inguinal area.    Genitourinary: No vaginal discharge found.  Musculoskeletal: Normal range of motion. She exhibits no tenderness.  Lymphadenopathy:    She has no cervical adenopathy.       Right: No inguinal adenopathy present.       Left: No inguinal adenopathy present.  Neurological: She is alert and oriented to person, place, and time. No cranial nerve deficit. She exhibits normal muscle tone. Coordination normal.  Skin: Skin is warm and dry. No rash noted. She is not diaphoretic. No erythema.  Psychiatric: She has a normal mood and affect. Her behavior is normal. Judgment and thought content normal.       Assessment:     Obese female with obvious left upper quadrant lipoma.  Asymmetry in the right panniculus but no definite lipoma.  Possible periumbilical lipoma.    Plan:     Excision/exploration:  The pathophysiology of skin & subcutaneous masses was discussed.  Natural history risks without surgery were discussed.   I recommended surgery to remove the mass.  I explained the technique of removal with use of local anesthesia & possible need for more aggressive sedation/anesthesia for patient comfort.    Risks such as bleeding, infection, heart attack, death, and other risks were discussed.  I noted a good likelihood this will help address the problem.   Possibility that this will not correct all symptoms was explained. Possibility of regrowth/recurrence of the mass was discussed.  We will work to minimize complications. Questions were answered.  The patient expresses understanding & wishes to proceed with surgery.  I did caution her I may not find anything of concern on the right side.  I am not ready to do a panniculectomy on her.  She wishes to proceed with surgery.  Given the size of do not think local anesthetic is acceptable and wouldn't require at least a deep sedation if not probable general.  I did caution her she will have a moderate size incision to get the mass out.  Hopefully she will not have issues of hyperpigmentation or hypertrophic results.  She understands and wishes to proceed.

## 2012-05-24 NOTE — Patient Instructions (Addendum)
Lipoma A lipoma is a noncancerous (benign) tumor composed of fat cells. They are usually found under the skin (subcutaneous). A lipoma may occur in any tissue of the body that contains fat. Common areas for lipomas to appear include the back, shoulders, buttocks, and thighs. Lipomas are a very common soft tissue growth. They are soft and grow slowly. Most problems caused by a lipoma depend on where it is growing. DIAGNOSIS  A lipoma can be diagnosed with a physical exam. These tumors rarely become cancerous, but radiographic studies can help determine this for certain. Studies used may include:  Computerized X-ray scans (CT or CAT scan).   Computerized magnetic scans (MRI).  TREATMENT  Small lipomas that are not causing problems may be watched. If a lipoma continues to enlarge or causes problems, removal is often the best treatment. Lipomas can also be removed to improve appearance. Surgery is done to remove the fatty cells and the surrounding capsule. Most often, this is done with medicine that numbs the area (local anesthetic). The removed tissue is examined under a microscope to make sure it is not cancerous. Keep all follow-up appointments with your caregiver. SEEK MEDICAL CARE IF:   The lipoma becomes larger or hard.   The lipoma becomes painful, red, or increasingly swollen. These could be signs of infection or a more serious condition.  Document Released: 08/06/2002 Document Revised: 08/05/2011 Document Reviewed: 01/16/2010 [facility] Patient Information 2012 [place], [place].  Obesity Obesity is defined as having a body mass index (BMI) of 30 or more. To calculate your BMI divide your weight in pounds by your height in inches squared and multiply that product by 703. Major illnesses resulting from long-term obesity include:  Stroke.   Heart disease.   Diabetes.   Many cancers.   Arthritis.  Obesity also complicates recovery from many other medical problems.  CAUSES   A history  of obesity in your parents.   Thyroid hormone imbalance.   Environmental factors such as excess calorie intake and physical inactivity.  TREATMENT  A healthy weight loss program includes:  A calorie restricted diet based on individual calorie needs.   Increased physical activity (exercise).  An exercise program is just as important as the right low-calorie diet.  Weight-loss medicines should be used only under the supervision of your physician. These medicines help, but only if they are used with diet and exercise programs. Medicines can have side effects including nervousness, nausea, abdominal pain, diarrhea, headache, drowsiness, and depression.  An unhealthy weight loss program includes:  Fasting.   Fad diets.   Supplements and drugs.  These choices do not succeed in long-term weight control.  HOME CARE INSTRUCTIONS  To help you make the needed dietary changes:   Exercise and perform physical activity as directed by your caregiver.   Keep a daily record of everything you eat. There are many free websites to help you with this. It may be helpful to measure your foods so you can determine if you are eating the correct portion sizes.   Use low-calorie cookbooks or take special cooking classes.   Avoid alcohol. Drink more water and drinks with no calories.   Take vitamins and supplements only as recommended by your caregiver.   Weight loss support groups, Registered Dieticians, counselors, and stress reduction education can also be very helpful.  Document Released: 09/23/2004 Document Revised: 08/05/2011 Document Reviewed: 07/23/2007 [facility] Patient Information 2012 [place], [place].

## 2012-06-07 ENCOUNTER — Encounter (INDEPENDENT_AMBULATORY_CARE_PROVIDER_SITE_OTHER): Payer: Self-pay

## 2012-06-27 ENCOUNTER — Other Ambulatory Visit (INDEPENDENT_AMBULATORY_CARE_PROVIDER_SITE_OTHER): Payer: Self-pay | Admitting: Surgery

## 2012-06-27 DIAGNOSIS — D1739 Benign lipomatous neoplasm of skin and subcutaneous tissue of other sites: Secondary | ICD-10-CM

## 2012-06-27 HISTORY — PX: LIPOMA EXCISION: SHX5283

## 2012-06-28 ENCOUNTER — Ambulatory Visit (INDEPENDENT_AMBULATORY_CARE_PROVIDER_SITE_OTHER): Payer: 59

## 2012-06-28 ENCOUNTER — Encounter (INDEPENDENT_AMBULATORY_CARE_PROVIDER_SITE_OTHER): Payer: Self-pay

## 2012-06-28 VITALS — BP 108/70 | HR 68 | Temp 97.9°F | Resp 16 | Ht 61.0 in | Wt 197.0 lb

## 2012-06-28 DIAGNOSIS — G8918 Other acute postprocedural pain: Secondary | ICD-10-CM

## 2012-06-28 DIAGNOSIS — R52 Pain, unspecified: Secondary | ICD-10-CM

## 2012-06-28 NOTE — Progress Notes (Signed)
Patient called into office concerned that her JP drain was clogged.  Patient did not feel comfortable attempting to Milk the drain at home.  Patient scheduled for nurse visit to check abd drain.  Abdominal JP drain appears to be working properly, patient has follow up appointment on 07/12/12 w/Dr. Michaell Cowing.  Patient will call our office if she has any questions or concerns.

## 2012-06-30 ENCOUNTER — Telehealth (INDEPENDENT_AMBULATORY_CARE_PROVIDER_SITE_OTHER): Payer: Self-pay | Admitting: General Surgery

## 2012-06-30 NOTE — Telephone Encounter (Signed)
Patient made aware of path results. Will follow up at appt and call with any questions prior.  

## 2012-06-30 NOTE — Telephone Encounter (Signed)
Message copied by Liliana Cline on Fri Jun 30, 2012  8:19 AM ------      Message from: Ardeth Sportsman      Created: Fri Jun 30, 2012  7:35 AM       Tell pt the good news!  Path c/w benign lipoma

## 2012-07-07 ENCOUNTER — Telehealth (INDEPENDENT_AMBULATORY_CARE_PROVIDER_SITE_OTHER): Payer: Self-pay | Admitting: General Surgery

## 2012-07-07 NOTE — Telephone Encounter (Signed)
Pt called for advice on how to loosen dressing with dried blood that is around her JP drain tubing.  Explained how to moisten and gently loosen it from her skin, which she was able to do while I waited on the phone.  No other issues at this time.

## 2012-07-12 ENCOUNTER — Encounter (INDEPENDENT_AMBULATORY_CARE_PROVIDER_SITE_OTHER): Payer: Self-pay | Admitting: Surgery

## 2012-07-12 ENCOUNTER — Ambulatory Visit (INDEPENDENT_AMBULATORY_CARE_PROVIDER_SITE_OTHER): Payer: 59 | Admitting: Surgery

## 2012-07-12 VITALS — BP 122/80 | HR 92 | Temp 97.1°F | Resp 16 | Ht 60.0 in | Wt 198.0 lb

## 2012-07-12 DIAGNOSIS — D171 Benign lipomatous neoplasm of skin and subcutaneous tissue of trunk: Secondary | ICD-10-CM

## 2012-07-12 DIAGNOSIS — D1739 Benign lipomatous neoplasm of skin and subcutaneous tissue of other sites: Secondary | ICD-10-CM

## 2012-07-12 DIAGNOSIS — R198 Other specified symptoms and signs involving the digestive system and abdomen: Secondary | ICD-10-CM

## 2012-07-12 NOTE — Progress Notes (Signed)
Subjective:     Patient ID: Kendra [name], female   DOB: 1986-06-14, 26 y.o.   MRN: [id number]  HPI  CALLEN KOZIEL  [birth year] [id number]  Patient Care Team: Maurice Small, MD as PCP - General (Family Medicine)  This patient is a 26 y.o.female who presents today for surgical evaluation.   Procedure: Exploration of abdominal wall.  Removal of left upper quadrant lipoma with drain placement 06/27/2012  The patient comes in today feeling fine.  Not needing pain medications.  Drain output is 20-30 mL a day.  No fevers or chills.  Energy level good.  She had received pathology results on the lipoma.  Patient Active Problem List  Diagnosis  . Chlamydia infection  . 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 Abdominal mass, LUQ (left upper quadrant), probable lipoma  . 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 Asthma   . H/O: C-section   . Lipoma     Past Surgical History  Procedure Date  . Cesarean section 09/09/2010  . Breast surgery 12/30/11    breast reduction  . Abdmonial mass 2013    History   Social History  . Marital Status: Single    Spouse Name: N/A    Number of Children: N/A  . Years of Education: N/A   Occupational History  . Not on file.   Social History Main Topics  . Smoking status: Former Smoker    Types: Cigarettes  . Smokeless tobacco: Never Used  . Alcohol Use: No  . Drug Use: No  . Sexually Active: Yes -- Female partner(s)    Birth Control/ Protection: Condom   Other Topics Concern  . Not on file   Social History Narrative  . No narrative on file    Family History  Problem Relation Age of Onset  . Kidney disease Mother     kidney stones  . Heart disease Maternal Uncle   . Stroke Maternal Uncle   . Cancer Maternal Grandmother     breast  . Cancer Maternal Grandfather   . Deep vein thrombosis Maternal Grandfather     in throat  . Diabetes Paternal Grandfather   . Cancer Maternal Aunt    breast    Current Outpatient Prescriptions  Medication Sig Dispense Refill  . albuterol (PROVENTIL) (5 MG/ML) 0.5% nebulizer solution Take 2.5 mg by nebulization every 6 (six) hours as needed.         Allergies  Allergen Reactions  . Penicillins Hives and Swelling    Swelling of throat  . Asa Buff (Mag (Buffered Aspirin) Swelling  . Iodine   . Peanut-Containing Drug Products Hives and Swelling    Pt is allergic to peanut oil  . Shellfish Allergy Hives and Swelling    Pt throat swells    BP 122/80  Pulse 92  Temp 97.1 F (36.2 C) (Temporal)  Resp 16  Ht 5' (1.524 m)  Wt 198 lb (89.812 kg)  BMI 38.67 kg/m2  No results found.   Review of Systems  Constitutional: Negative for fever, chills and diaphoresis.  HENT: Negative for ear pain, sore throat and trouble swallowing.   Eyes: Negative for photophobia and visual disturbance.  Respiratory: Negative for cough and choking.   Cardiovascular: Negative for chest pain and palpitations.  Gastrointestinal: Negative for nausea, vomiting, abdominal pain, diarrhea, constipation, anal bleeding and rectal pain.  Genitourinary: Negative for dysuria, frequency and difficulty urinating.  Musculoskeletal: Negative for myalgias  and gait problem.  Skin: Negative for color change, pallor and rash.  Neurological: Negative for dizziness, speech difficulty, weakness and numbness.  Hematological: Negative for adenopathy.  Psychiatric/Behavioral: Negative for confusion and agitation. The patient is not nervous/anxious.        Objective:   Physical Exam  Constitutional: She is oriented to person, place, and time. She appears well-developed and well-nourished. No distress.  HENT:  Head: Normocephalic.  Mouth/Throat: Oropharynx is clear and moist. No oropharyngeal exudate.  Eyes: Conjunctivae normal and EOM are normal. Pupils are equal, round, and reactive to light. No scleral icterus.  Neck: Normal range of motion. No tracheal deviation  present.  Cardiovascular: Normal rate and intact distal pulses.   Pulmonary/Chest: Effort normal. No respiratory distress. She exhibits no tenderness.  Abdominal: Soft. She exhibits no distension. There is no tenderness. Hernia confirmed negative in the right inguinal area and confirmed negative in the left inguinal area.         Incisions clean with normal healing ridges.  No hernias  Genitourinary: No vaginal discharge found.  Musculoskeletal: Normal range of motion. She exhibits no tenderness.  Lymphadenopathy:       Right: No inguinal adenopathy present.       Left: No inguinal adenopathy present.  Neurological: She is alert and oriented to person, place, and time. No cranial nerve deficit. She exhibits normal muscle tone. Coordination normal.  Skin: Skin is warm and dry. No rash noted. She is not diaphoretic.  Psychiatric: She has a normal mood and affect. Her behavior is normal.       Assessment:     Two weeks status post removal of lipoma from left upper quadrant abdomen.  No evidence of other lipoma or mass in panniculus.  Natural asymmetry with right greater than left fullness of panniculus.    Plan:     Increase activity as tolerated to regular activity.  Do not push through pain.  Diet as tolerated.   Return to clinic p.r.n.   Instructions discussed.  Followup with primary care physician for other health issues as would normally be done.  Questions answered.  The patient expressed understanding and appreciation

## 2012-07-12 NOTE — Patient Instructions (Addendum)
It is okay to shower every day.  Place Band-Aid over where drain used to be.  It should dry up.  GENERAL SURGERY: POST OP INSTRUCTIONS  1. DIET: Follow a light bland diet the first 24 hours after arrival home, such as soup, liquids, crackers, etc.  Be sure to include lots of fluids daily.  Avoid fast food or heavy meals as your are more likely to get nauseated.   2. Take your usually prescribed home medications unless otherwise directed. 3. PAIN CONTROL: a. Pain is best controlled by a usual combination of three different methods TOGETHER: i. Ice/Heat ii. Over the counter pain medication iii. Prescription pain medication b. Most patients will experience some swelling and bruising around the incisions.  Ice packs or heating pads (30-60 minutes up to 6 times a day) will help. Use ice for the first few days to help decrease swelling and bruising, then switch to heat to help relax tight/sore spots and speed recovery.  Some people prefer to use ice alone, heat alone, alternating between ice & heat.  Experiment to what works for you.  Swelling and bruising can take several weeks to resolve.   c. It is helpful to take an over-the-counter pain medication regularly for the first few weeks.  Choose one of the following that works best for you: i. Naproxen (Aleve, etc)  Two 220mg  tabs twice a day ii. Ibuprofen (Advil, etc) Three 200mg  tabs four times a day (every meal & bedtime) iii. Acetaminophen (Tylenol, etc) 500-650mg  four times a day (every meal & bedtime) d. A  prescription for pain medication (such as oxycodone, hydrocodone, etc) should be given to you upon discharge.  Take your pain medication as prescribed.  i. If you are having problems/concerns with the prescription medicine (does not control pain, nausea, vomiting, rash, itching, etc), please call us [phone] to see if we need to switch you to a different pain medicine that will work better for you and/or control your side effect  better. ii. If you need a refill on your pain medication, please contact your pharmacy.  They will contact our office to request authorization. Prescriptions will not be filled after 5 pm or on week-ends. 4. Avoid getting constipated.  Between the surgery and the pain medications, it is common to experience some constipation.  Increasing fluid intake and taking a fiber supplement (such as Metamucil, Citrucel, FiberCon, MiraLax, etc) 1-2 times a day regularly will usually help prevent this problem from occurring.  A mild laxative (prune juice, Milk of Magnesia, MiraLax, etc) should be taken according to package directions if there are no bowel movements after 48 hours.   5. Wash / shower every day.  You may shower over the dressings as they are waterproof.  Continue to shower over incision(s) after the dressing is off. 6. Remove your waterproof bandages 5 days after surgery.  You may leave the incision open to air.  You may have skin tapes (Steri Strips) covering the incision(s).  Leave them on until one week, then remove.  You may replace a dressing/Band-Aid to cover the incision for comfort if you wish.      7. ACTIVITIES as tolerated:   a. You may resume regular (light) daily activities beginning the next day-such as daily self-care, walking, climbing stairs-gradually increasing activities as tolerated.  If you can walk 30 minutes without difficulty, it is safe to try more intense activity such as jogging, treadmill, bicycling, low-impact aerobics, swimming, etc. b. Save the most intensive and  strenuous activity for last such as sit-ups, heavy lifting, contact sports, etc  Refrain from any heavy lifting or straining until you are off narcotics for pain control.   c. DO NOT PUSH THROUGH PAIN.  Let pain be your guide: If it hurts to do something, don't do it.  Pain is your body warning you to avoid that activity for another week until the pain goes down. d. You may drive when you are no longer taking  prescription pain medication, you can comfortably wear a seatbelt, and you can safely maneuver your car and apply brakes. e. Bonita Quin may have sexual intercourse when it is comfortable.  8. FOLLOW UP in our office a. Please call [facility] at [phone] to set up an appointment to see your surgeon in the office for a follow-up appointment approximately 2-3 weeks after your surgery. b. Make sure that you call for this appointment the day you arrive home to insure a convenient appointment time. 9. IF YOU HAVE DISABILITY OR FAMILY LEAVE FORMS, BRING THEM TO THE OFFICE FOR PROCESSING.  DO NOT GIVE THEM TO YOUR DOCTOR.   WHEN TO CALL us [phone]: 1. Poor pain control 2. Reactions / problems with new medications (rash/itching, nausea, etc)  3. Fever over 101.5 F (38.5 C) 4. Worsening swelling or bruising 5. Continued bleeding from incision. 6. Increased pain, redness, or drainage from the incision   The clinic staff is available to answer your questions during regular business hours (8:30am-5pm).  Please don't hesitate to call and ask to speak to one of our nurses for clinical concerns.   If you have a medical emergency, go to the nearest emergency room or call 911.  A surgeon from [facility] is always on call at the [facility], [place] [address], [place], [place]  [address] ? MAIN: [phone] ? TOLL FREE: [phone] ?  FAX [phone] [email or web address]

## 2013-01-25 ENCOUNTER — Encounter (HOSPITAL_COMMUNITY): Payer: Self-pay | Admitting: Emergency Medicine

## 2013-01-25 ENCOUNTER — Emergency Department (HOSPITAL_COMMUNITY)
Admission: EM | Admit: 2013-01-25 | Discharge: 2013-01-25 | Disposition: A | Discharge status: Home / Self Care | Payer: Medicaid Other | Attending: Emergency Medicine | Admitting: Emergency Medicine

## 2013-01-25 ENCOUNTER — Emergency Department (HOSPITAL_COMMUNITY): Payer: Medicaid Other

## 2013-01-25 DIAGNOSIS — S63611A Unspecified sprain of left index finger, initial encounter: Secondary | ICD-10-CM

## 2013-01-25 DIAGNOSIS — Z880 Allergy status to penicillin: Secondary | ICD-10-CM | POA: Insufficient documentation

## 2013-01-25 DIAGNOSIS — Z8589 Personal history of malignant neoplasm of other organs and systems: Secondary | ICD-10-CM | POA: Insufficient documentation

## 2013-01-25 DIAGNOSIS — J45909 Unspecified asthma, uncomplicated: Secondary | ICD-10-CM | POA: Insufficient documentation

## 2013-01-25 DIAGNOSIS — F17200 Nicotine dependence, unspecified, uncomplicated: Secondary | ICD-10-CM | POA: Insufficient documentation

## 2013-01-25 DIAGNOSIS — Z9889 Other specified postprocedural states: Secondary | ICD-10-CM | POA: Insufficient documentation

## 2013-01-25 DIAGNOSIS — S6390XA Sprain of unspecified part of unspecified wrist and hand, initial encounter: Secondary | ICD-10-CM | POA: Insufficient documentation

## 2013-01-25 MED ORDER — ACETAMINOPHEN 500 MG PO TABS: 500.0000 mg | ORAL_TABLET | Freq: Four times a day (QID) | ORAL | Status: DC | PRN

## 2013-01-25 MED ORDER — HYDROCODONE-ACETAMINOPHEN 5-325 MG PO TABS: ORAL_TABLET | ORAL | Status: DC
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
## 2013-01-27 NOTE — ED Provider Notes (Signed)
Medical screening examination/treatment/procedure(s) were performed by non-physician practitioner and as supervising physician I was immediately available for consultation/collaboration.  Ross Bender, MD 01/27/13 0023 

## 2013-06-19 ENCOUNTER — Other Ambulatory Visit: Payer: Self-pay | Admitting: Family Medicine

## 2013-06-19 ENCOUNTER — Other Ambulatory Visit (HOSPITAL_COMMUNITY)
Admission: RE | Admit: 2013-06-19 | Discharge: 2013-06-19 | Disposition: A | Discharge status: Home / Self Care | Payer: Medicaid Other | Source: Ambulatory Visit | Attending: Family Medicine | Admitting: Family Medicine

## 2013-06-19 DIAGNOSIS — Z124 Encounter for screening for malignant neoplasm of cervix: Secondary | ICD-10-CM | POA: Insufficient documentation

## 2013-08-20 ENCOUNTER — Emergency Department (HOSPITAL_COMMUNITY)
Admission: EM | Admit: 2013-08-20 | Discharge: 2013-08-20 | Disposition: A | Discharge status: Home / Self Care | Payer: Medicaid Other | Attending: Emergency Medicine | Admitting: Emergency Medicine

## 2013-08-20 ENCOUNTER — Encounter (HOSPITAL_COMMUNITY): Payer: Self-pay | Admitting: Emergency Medicine

## 2013-08-20 DIAGNOSIS — IMO0002 Reserved for concepts with insufficient information to code with codable children: Secondary | ICD-10-CM | POA: Insufficient documentation

## 2013-08-20 DIAGNOSIS — Z872 Personal history of diseases of the skin and subcutaneous tissue: Secondary | ICD-10-CM | POA: Insufficient documentation

## 2013-08-20 DIAGNOSIS — Z880 Allergy status to penicillin: Secondary | ICD-10-CM | POA: Insufficient documentation

## 2013-08-20 DIAGNOSIS — Z79899 Other long term (current) drug therapy: Secondary | ICD-10-CM | POA: Insufficient documentation

## 2013-08-20 DIAGNOSIS — L509 Urticaria, unspecified: Secondary | ICD-10-CM | POA: Insufficient documentation

## 2013-08-20 DIAGNOSIS — R6889 Other general symptoms and signs: Secondary | ICD-10-CM | POA: Insufficient documentation

## 2013-08-20 DIAGNOSIS — J45909 Unspecified asthma, uncomplicated: Secondary | ICD-10-CM | POA: Insufficient documentation

## 2013-08-20 DIAGNOSIS — F17200 Nicotine dependence, unspecified, uncomplicated: Secondary | ICD-10-CM | POA: Insufficient documentation

## 2013-08-20 DIAGNOSIS — I10 Essential (primary) hypertension: Secondary | ICD-10-CM | POA: Insufficient documentation

## 2013-08-20 DIAGNOSIS — T7840XA Allergy, unspecified, initial encounter: Secondary | ICD-10-CM

## 2013-08-20 MED ORDER — EPINEPHRINE 0.3 MG/0.3ML IJ SOAJ: 0.3000 mg | Freq: Once | INTRAMUSCULAR | Status: DC

## 2013-08-20 MED ORDER — PREDNISONE 20 MG PO TABS: 20.0000 mg | ORAL_TABLET | Freq: Every day | ORAL | Status: DC

## 2013-08-20 MED ORDER — FAMOTIDINE 20 MG PO TABS
20.0000 mg | ORAL_TABLET | Freq: Once | ORAL | Status: AC
  Administered 2013-08-20: 20 mg via ORAL
  Filled 2013-08-20: qty 1

## 2013-08-20 MED ORDER — PREDNISONE 20 MG PO TABS
40.0000 mg | ORAL_TABLET | Freq: Once | ORAL | Status: AC
  Administered 2013-08-20: 40 mg via ORAL
  Filled 2013-08-20: qty 2

## 2013-08-20 MED ORDER — DIPHENHYDRAMINE HCL 50 MG/ML IJ SOLN
50.0000 mg | Freq: Once | INTRAMUSCULAR | Status: AC
  Administered 2013-08-20: 50 mg via INTRAMUSCULAR
  Filled 2013-08-20: qty 1

## 2013-08-20 NOTE — ED Notes (Signed)
Pt. reports itchy rashes/ hives at arms and chest onset this evening , respirations unlabored / airway intact .

## 2013-08-20 NOTE — ED Provider Notes (Signed)
Medical screening examination/treatment/procedure(s) were performed by non-physician practitioner and as supervising physician I was immediately available for consultation/collaboration.  EKG Interpretation   None         Julie Manly, MD 08/20/13 0519 

## 2013-08-20 NOTE — ED Provider Notes (Signed)
CSN: [id number]     Arrival date & time 08/20/13  0015 History   First MD Initiated Contact with Patient 08/20/13 0047     Chief Complaint  Patient presents with  . Urticaria   (Consider location/radiation/quality/duration/timing/severity/associated sxs/prior Treatment) HPI History provided by pt.   Pt presents w/ allergic reaction that developed ~1hr prior to arrival.  Sx include hives of posterior thighs, upper extremities, upper chest and neck.  Associated w/ mild edema of lips as well as sensation of throat tightening.  H/o anaphylaxis.  Has several allergies and is unsure of offending allergen today.  Took 25mg  benadryl at home w/out relief.  Past Medical History  Diagnosis Date  . Asthma   . H/O: C-section   . Lipoma   . Lipoma of LUQ abdomen, s/p removal 06/27/2012 05/24/2012   Past Surgical History  Procedure Laterality Date  . Cesarean section  09/09/2010  . Breast surgery  12/30/11    breast reduction  . Abdmonial mass  2013   Family History  Problem Relation Age of Onset  . Kidney disease Mother     kidney stones  . Heart disease Maternal Uncle   . Stroke Maternal Uncle   . Cancer Maternal Grandmother     breast  . Cancer Maternal Grandfather   . Deep vein thrombosis Maternal Grandfather     in throat  . Diabetes Paternal Grandfather   . Cancer Maternal Aunt     breast   History  Substance Use Topics  . Smoking status: Current Every Day Smoker    Types: Cigarettes  . Smokeless tobacco: Never Used  . Alcohol Use: Yes   OB History   Grav Para Term Preterm Abortions TAB SAB Ect Mult Living   1 1 1       1      Review of Systems  All other systems reviewed and are negative.    Allergies  Penicillins; Asa buff (mag; Iodine; Peanut-containing drug products; and Shellfish allergy  Home Medications   Current Outpatient Rx  Name  Route  Sig  Dispense  Refill  . acetaminophen (TYLENOL) 500 MG tablet   Oral   Take 1 tablet (500 mg total) by mouth every 6  (six) hours as needed for pain.   30 tablet   0   . albuterol (PROVENTIL) (5 MG/ML) 0.5% nebulizer solution   Nebulization   Take 2.5 mg by nebulization every 6 (six) hours as needed for wheezing.          Marland Kitchen EPINEPHrine (EPIPEN) 0.3 mg/0.3 mL SOAJ injection   Intramuscular   Inject 0.3 mLs (0.3 mg total) into the muscle once.   1 Device   0   . Fluticasone-Salmeterol (ADVAIR) 250-50 MCG/DOSE AEPB   Inhalation   Inhale 1 puff into the lungs every 12 (twelve) hours.         Marland Kitchen HYDROcodone-acetaminophen (NORCO/VICODIN) 5-325 MG per tablet      Take 1-2 pills every 6 hours as needed for pain.   6 tablet   0    BP 138/83  Pulse 91  Temp(Src) 98.1 F (36.7 C) (Oral)  Resp 16  SpO2 96%  LMP 08/13/2013 Physical Exam  Nursing note and vitals reviewed. Constitutional: She is oriented to person, place, and time. She appears well-developed and well-nourished. No distress.  Speaking easily.  Scratching arms.  HENT:  Head: Normocephalic and atraumatic.  Mouth/Throat: Oropharynx is clear and moist and mucous membranes are normal. No posterior oropharyngeal edema.  No lip or tongue edema.  Eyes:  Normal appearance  Neck: Normal range of motion.  Cardiovascular: Normal rate and regular rhythm.   Pulmonary/Chest: Effort normal and breath sounds normal. No stridor. No respiratory distress.  Musculoskeletal: Normal range of motion.  Neurological: She is alert and oriented to person, place, and time.  Skin: Skin is warm and dry.  Urticaria of neck, chest, upper extremities.   Psychiatric: She has a normal mood and affect. Her behavior is normal.    ED Course  Procedures (including critical care time) Labs Review Labs Reviewed - No data to display Imaging Review No results found.  EKG Interpretation   None       MDM   1. Allergic reaction, initial encounter    27yo F presented to ED w/ allergic reaction.  Reported sensation of throat tightness, though comfortable  appearing and no stridor or respiratory distress on exam. Treated w/ 50mg  IM benadryl and po pepcid and prednisone.  Urticaria improved and throat tightness and subjective lip edema resolved.  D/c'd home w/ prednisone and epi-pen and recommended that she continue benadryl and pepcid.  Return precautions discussed.     Otilio Miu, PA-C 08/20/13 [phone]

## 2013-08-27 ENCOUNTER — Emergency Department (HOSPITAL_COMMUNITY)
Admission: EM | Admit: 2013-08-27 | Discharge: 2013-08-27 | Disposition: A | Discharge status: Home / Self Care | Payer: Medicaid Other | Attending: Emergency Medicine | Admitting: Emergency Medicine

## 2013-08-27 ENCOUNTER — Encounter (HOSPITAL_COMMUNITY): Payer: Self-pay | Admitting: Emergency Medicine

## 2013-08-27 DIAGNOSIS — K649 Unspecified hemorrhoids: Secondary | ICD-10-CM

## 2013-08-27 DIAGNOSIS — J45909 Unspecified asthma, uncomplicated: Secondary | ICD-10-CM | POA: Insufficient documentation

## 2013-08-27 DIAGNOSIS — Z79899 Other long term (current) drug therapy: Secondary | ICD-10-CM | POA: Insufficient documentation

## 2013-08-27 DIAGNOSIS — K644 Residual hemorrhoidal skin tags: Secondary | ICD-10-CM | POA: Insufficient documentation

## 2013-08-27 DIAGNOSIS — Z880 Allergy status to penicillin: Secondary | ICD-10-CM | POA: Insufficient documentation

## 2013-08-27 DIAGNOSIS — F17200 Nicotine dependence, unspecified, uncomplicated: Secondary | ICD-10-CM | POA: Insufficient documentation

## 2013-08-27 MED ORDER — LIDOCAINE 5 % EX OINT: 1.0000 "application " | TOPICAL_OINTMENT | CUTANEOUS | Status: DC | PRN

## 2013-08-27 MED ORDER — DOCUSATE SODIUM 100 MG PO CAPS: 100.0000 mg | ORAL_CAPSULE | Freq: Two times a day (BID) | ORAL | Status: DC

## 2013-08-27 MED ORDER — HYDROCORTISONE ACETATE 25 MG RE SUPP: 25.0000 mg | Freq: Two times a day (BID) | RECTAL | Status: DC

## 2013-08-27 NOTE — ED Notes (Signed)
Pt presents with hemorrhoids, c/o pain also.

## 2013-08-27 NOTE — ED Provider Notes (Signed)
CSN: [id number]     Arrival date & time 08/27/13  0750 History   First MD Initiated Contact with Patient 08/27/13 0754     Chief Complaint  Patient presents with  . Hemorrhoids   (Consider location/radiation/quality/duration/timing/severity/associated sxs/prior Treatment) Patient is a 27 y.o. female presenting with hematochezia. The history is provided by the patient.  Rectal Bleeding Quality:  Bright red Amount:  Scant Timing:  Sporadic Progression:  Worsening Chronicity:  New Context: hemorrhoids and rectal pain   Context: not anal fissures, not anal penetration, not diarrhea, not rectal injury and not spontaneously   Pain details:    Quality:  Sharp   Severity:  Moderate   Timing:  Constant   Progression:  Worsening Similar prior episodes: no   Relieved by:  Nothing Worsened by:  Nothing tried Associated symptoms: no abdominal pain, no fever and no vomiting     Past Medical History  Diagnosis Date  . Asthma   . H/O: C-section   . Lipoma   . Lipoma of LUQ abdomen, s/p removal 06/27/2012 05/24/2012   Past Surgical History  Procedure Laterality Date  . Cesarean section  09/09/2010  . Breast surgery  12/30/11    breast reduction  . Abdmonial mass  2013   Family History  Problem Relation Age of Onset  . Kidney disease Mother     kidney stones  . Heart disease Maternal Uncle   . Stroke Maternal Uncle   . Cancer Maternal Grandmother     breast  . Cancer Maternal Grandfather   . Deep vein thrombosis Maternal Grandfather     in throat  . Diabetes Paternal Grandfather   . Cancer Maternal Aunt     breast   History  Substance Use Topics  . Smoking status: Current Every Day Smoker    Types: Cigarettes  . Smokeless tobacco: Never Used  . Alcohol Use: Yes   OB History   Grav Para Term Preterm Abortions TAB SAB Ect Mult Living   1 1 1       1      Review of Systems  Constitutional: Negative for fever.  Respiratory: Negative for cough and shortness of breath.    Gastrointestinal: Positive for hematochezia. Negative for vomiting and abdominal pain.  All other systems reviewed and are negative.    Allergies  Penicillins; Asa buff (mag; Iodine; Peanut-containing drug products; and Shellfish allergy  Home Medications   Current Outpatient Rx  Name  Route  Sig  Dispense  Refill  . albuterol (PROVENTIL) (5 MG/ML) 0.5% nebulizer solution   Nebulization   Take 2.5 mg by nebulization every 6 (six) hours as needed for wheezing.          . Fluticasone-Salmeterol (ADVAIR) 250-50 MCG/DOSE AEPB   Inhalation   Inhale 1 puff into the lungs every 12 (twelve) hours.         . docusate sodium (COLACE) 100 MG capsule   Oral   Take 1 capsule (100 mg total) by mouth every 12 (twelve) hours.   60 capsule   0   . EPINEPHrine (EPIPEN) 0.3 mg/0.3 mL SOAJ injection   Intramuscular   Inject 0.3 mLs (0.3 mg total) into the muscle once.   1 Device   0   . hydrocortisone (ANUSOL-HC) 25 MG suppository   Rectal   Place 1 suppository (25 mg total) rectally 2 (two) times daily. For 7 days   14 suppository   0   . lidocaine (XYLOCAINE) 5 % ointment  Topical   Apply 1 application topically as needed. Apply pea-size amount to anus twice daily.   35.44 g   0    BP 125/55  Pulse 80  Temp(Src) 98.7 F (37.1 C) (Oral)  Resp 22  SpO2 98%  LMP 08/13/2013 Physical Exam  Nursing note and vitals reviewed. Constitutional: She is oriented to person, place, and time. She appears well-developed and well-nourished. No distress.  HENT:  Head: Normocephalic and atraumatic.  Eyes: EOM are normal. Pupils are equal, round, and reactive to light.  Neck: Normal range of motion. Neck supple.  Cardiovascular: Normal rate and regular rhythm.  Exam reveals no friction rub.   No murmur heard. Pulmonary/Chest: Effort normal and breath sounds normal. No respiratory distress. She has no wheezes. She has no rales.  Abdominal: Soft. She exhibits no distension. There is no  tenderness. There is no rebound.  Genitourinary: Rectal exam shows external hemorrhoid (Small, 6 o'clock) and tenderness. Rectal exam shows no internal hemorrhoid, no fissure, no mass and anal tone normal.  Musculoskeletal: Normal range of motion. She exhibits no edema.  Neurological: She is alert and oriented to person, place, and time.  Skin: She is not diaphoretic.    ED Course  Procedures (including critical care time) Labs Review Labs Reviewed - No data to display Imaging Review No results found.  EKG Interpretation   None       MDM   1. Hemorrhoids    27 year old female presents with rectal pain and mild rectal bleeding. Original blood on the paper this morning. States feels like previous of the hemorrhoids. Patient has rectal pain on exam with small hemorrhoid at 6:00. Hemorrhoids are nonthrombosed soft. No need for acute intervention. Given Anusol suppositories, Colace, lidocaine jelly for pain relief. Counseled on sitz baths. Stable for discharge.    Dagmar Hait, MD 08/27/13 [phone]

## 2013-09-22 ENCOUNTER — Emergency Department (HOSPITAL_COMMUNITY)
Admission: EM | Admit: 2013-09-22 | Discharge: 2013-09-22 | Disposition: A | Discharge status: Home / Self Care | Payer: Medicaid Other | Attending: Emergency Medicine | Admitting: Emergency Medicine

## 2013-09-22 ENCOUNTER — Encounter (HOSPITAL_COMMUNITY): Payer: Self-pay | Admitting: Emergency Medicine

## 2013-09-22 DIAGNOSIS — Z880 Allergy status to penicillin: Secondary | ICD-10-CM | POA: Insufficient documentation

## 2013-09-22 DIAGNOSIS — IMO0002 Reserved for concepts with insufficient information to code with codable children: Secondary | ICD-10-CM | POA: Insufficient documentation

## 2013-09-22 DIAGNOSIS — F17200 Nicotine dependence, unspecified, uncomplicated: Secondary | ICD-10-CM | POA: Insufficient documentation

## 2013-09-22 DIAGNOSIS — J45909 Unspecified asthma, uncomplicated: Secondary | ICD-10-CM | POA: Insufficient documentation

## 2013-09-22 DIAGNOSIS — Z8589 Personal history of malignant neoplasm of other organs and systems: Secondary | ICD-10-CM | POA: Insufficient documentation

## 2013-09-22 DIAGNOSIS — Z79899 Other long term (current) drug therapy: Secondary | ICD-10-CM | POA: Insufficient documentation

## 2013-09-22 DIAGNOSIS — M545 Low back pain, unspecified: Secondary | ICD-10-CM

## 2013-09-22 MED ORDER — PREDNISONE 20 MG PO TABS: ORAL_TABLET | ORAL | Status: DC

## 2013-09-22 MED ORDER — BACLOFEN 10 MG PO TABS: 10.0000 mg | ORAL_TABLET | Freq: Three times a day (TID) | ORAL | Status: DC

## 2013-09-22 MED ORDER — IBUPROFEN 200 MG PO TABS
600.0000 mg | ORAL_TABLET | Freq: Once | ORAL | Status: AC
  Administered 2013-09-22: 600 mg via ORAL
  Filled 2013-09-22: qty 3

## 2013-09-22 MED ORDER — PREDNISONE 20 MG PO TABS
60.0000 mg | ORAL_TABLET | Freq: Once | ORAL | Status: AC
  Administered 2013-09-22: 60 mg via ORAL
  Filled 2013-09-22: qty 3

## 2013-09-22 MED ORDER — HYDROCODONE-ACETAMINOPHEN 5-325 MG PO TABS: 1.0000 | ORAL_TABLET | Freq: Four times a day (QID) | ORAL | Status: DC | PRN

## 2013-09-22 MED ORDER — HYDROCODONE-ACETAMINOPHEN 5-325 MG PO TABS
1.0000 | ORAL_TABLET | Freq: Once | ORAL | Status: AC
  Administered 2013-09-22: 1 via ORAL
  Filled 2013-09-22: qty 1

## 2013-09-22 NOTE — Discharge Instructions (Signed)
Back Exercises Back exercises help treat and prevent back injuries. The goal of back exercises is to increase the strength of your abdominal and back muscles and the flexibility of your back. These exercises should be started when you no longer have back pain. Back exercises include:  Pelvic Tilt. Lie on your back with your knees bent. Tilt your pelvis until the lower part of your back is against the floor. Hold this position 5 to 10 sec and repeat 5 to 10 times.  Knee to Chest. Pull first 1 knee up against your chest and hold for 20 to 30 seconds, repeat this with the other knee, and then both knees. This may be done with the other leg straight or bent, whichever feels better.  Sit-Ups or Curl-Ups. Bend your knees 90 degrees. Start with tilting your pelvis, and do a partial, slow sit-up, lifting your trunk only 30 to 45 degrees off the floor. Take at least 2 to 3 seconds for each sit-up. Do not do sit-ups with your knees out straight. If partial sit-ups are difficult, simply do the above but with only tightening your abdominal muscles and holding it as directed.  Hip-Lift. Lie on your back with your knees flexed 90 degrees. Push down with your feet and shoulders as you raise your hips a couple inches off the floor; hold for 10 seconds, repeat 5 to 10 times.  Back arches. Lie on your stomach, propping yourself up on bent elbows. Slowly press on your hands, causing an arch in your low back. Repeat 3 to 5 times. Any initial stiffness and discomfort should lessen with repetition over time.  Shoulder-Lifts. Lie face down with arms beside your body. Keep hips and torso pressed to floor as you slowly lift your head and shoulders off the floor. Do not overdo your exercises, especially in the beginning. Exercises may cause you some mild back discomfort which lasts for a few minutes; however, if the pain is more severe, or lasts for more than 15 minutes, do not continue exercises until you see your caregiver.  Improvement with exercise therapy for back problems is slow.  See your caregivers for assistance with developing a proper back exercise program. Document Released: 09/23/2004 Document Revised: 11/08/2011 Document Reviewed: 06/17/2011 [employer] Patient Information 2014 [employer].  

## 2013-09-22 NOTE — ED Provider Notes (Signed)
Medical screening examination/treatment/procedure(s) were performed by non-physician practitioner and as supervising physician I was immediately available for consultation/collaboration.  EKG Interpretation   None        Threasa Beards, MD 09/22/13 2125

## 2013-09-22 NOTE — ED Notes (Signed)
Pt c/o low back pain since yesterday.  Denies dysuria.  Denies injury.

## 2013-09-22 NOTE — ED Provider Notes (Signed)
CSN: [id number]     Arrival date & time 09/22/13  1900 History  This chart was scribed for non-physician practitioner, Junius Creamer, FNP,working with Threasa Beards, MD, by Marlowe Kays, ED Scribe.  This patient was seen in room WTR8/WTR8 and the patient's care was started at 9:02 PM.  Chief Complaint  Patient presents with  . Back Pain   The history is provided by the patient. No language interpreter was used.   HPI Comments:  Kendra [name] is a 28 y.o. female who presents to the Emergency Department complaining of sharp lower right-sided back pain for approximately two days. 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 She states movement makes her pain worse. She reports taking Ibuprofen, ASA and Tylenol with no relief. She denies numbness or tingling of lower extremities. Pt has been ambulatory with no issue. She states her LMP was the end of December.  Past Medical History  Diagnosis Date  . Asthma   . H/O: C-section   . Lipoma   . Lipoma of LUQ abdomen, s/p removal 06/27/2012 05/24/2012   Past Surgical History  Procedure Laterality Date  . Cesarean section  09/09/2010  . Breast surgery  12/30/11    breast reduction  . Abdmonial mass  2013   Family History  Problem Relation Age of Onset  . Kidney disease Mother     kidney stones  . Heart disease Maternal Uncle   . Stroke Maternal Uncle   . Cancer Maternal Grandmother     breast  . Cancer Maternal Grandfather   . Deep vein thrombosis Maternal Grandfather     in throat  . Diabetes Paternal Grandfather   . Cancer Maternal Aunt     breast   History  Substance Use Topics  . Smoking status: Current Every Day Smoker    Types: Cigarettes  . Smokeless tobacco: Never Used  . Alcohol Use: Yes   OB History   Grav Para Term Preterm Abortions TAB SAB Ect Mult Living   1 1 1       1      Review of Systems  Genitourinary: Negative for dysuria.  Musculoskeletal: Positive for back pain.   Neurological: Negative for numbness.  All other systems reviewed and are negative.    Allergies  Penicillins; Asa buff (mag; Iodine; Peanut-containing drug products; and Shellfish allergy  Home Medications   Current Outpatient Rx  Name  Route  Sig  Dispense  Refill  . cetirizine (ZYRTEC) 10 MG tablet   Oral   Take 10 mg by mouth daily.         . Fluticasone-Salmeterol (ADVAIR) 250-50 MCG/DOSE AEPB   Inhalation   Inhale 1 puff into the lungs every 12 (twelve) hours.         . montelukast (SINGULAIR) 10 MG tablet   Oral   Take 10 mg by mouth daily.         . baclofen (LIORESAL) 10 MG tablet   Oral   Take 1 tablet (10 mg total) by mouth 3 (three) times daily.   30 each   0   . EPINEPHrine (EPIPEN) 0.3 mg/0.3 mL SOAJ injection   Intramuscular   Inject 0.3 mLs (0.3 mg total) into the muscle once.   1 Device   0   . HYDROcodone-acetaminophen (NORCO/VICODIN) 5-325 MG per tablet   Oral   Take 1 tablet by mouth every 6 (six) hours as needed for moderate pain.   12  tablet   0   . predniSONE (DELTASONE) 20 MG tablet      3 Tabs PO Days 1-3, then 2 tabs PO Days 4-6, then 1 tab PO Day 7-9, then Half Tab PO Day 10-12   20 tablet   0    Triage Vitals: BP 138/78  Pulse 103  Temp(Src) 98.2 F (36.8 C) (Oral)  Resp 22  Ht 5\' 1"  (1.549 m)  Wt 198 lb (89.812 kg)  BMI 37.43 kg/m2  SpO2 100%  LMP 09/29/2012 Physical Exam  Nursing note and vitals reviewed. Constitutional: She is oriented to person, place, and time. She appears well-developed and well-nourished.  HENT:  Head: Normocephalic and atraumatic.  Eyes: EOM are normal.  Neck: Normal range of motion.  Cardiovascular: Normal rate.   Pulmonary/Chest: Effort normal.  Musculoskeletal: Normal range of motion. She exhibits tenderness.  L-2 level tenderness.  Neurological: She is alert and oriented to person, place, and time.  Skin: Skin is warm and dry.  Psychiatric: She has a normal mood and affect. Her  behavior is normal.    ED Course  Procedures (including critical care time) DIAGNOSTIC STUDIES: Oxygen Saturation is 100% on RA, normal by my interpretation.   COORDINATION OF CARE: 9:05 PM- Will give muscle relaxer, anti-inflammatory, and pain medication. Will provide note for work. Pt verbalizes understanding and agrees to plan.  Medications  predniSONE (DELTASONE) tablet 60 mg (60 mg Oral Given 09/22/13 2114)  HYDROcodone-acetaminophen (NORCO/VICODIN) 5-325 MG per tablet 1 tablet (1 tablet Oral Given 09/22/13 2116)  ibuprofen (ADVIL,MOTRIN) tablet 600 mg (600 mg Oral Given 09/22/13 2115)    Labs Review Labs Reviewed - No data to display Imaging Review No results found.  EKG Interpretation   None       MDM   1. Lumbosacral pain       I personally performed the services described in this documentation, which was scribed in my presence. The recorded information has been reviewed and is accurate.    Garald Balding, NP 09/22/13 2118

## 2014-05-22 ENCOUNTER — Encounter (HOSPITAL_COMMUNITY): Payer: Self-pay | Admitting: Emergency Medicine

## 2014-05-22 ENCOUNTER — Emergency Department (HOSPITAL_COMMUNITY)
Admission: EM | Admit: 2014-05-22 | Discharge: 2014-05-22 | Disposition: A | Discharge status: Home / Self Care | Payer: Medicaid Other | Attending: Emergency Medicine | Admitting: Emergency Medicine

## 2014-05-22 DIAGNOSIS — A499 Bacterial infection, unspecified: Secondary | ICD-10-CM | POA: Insufficient documentation

## 2014-05-22 DIAGNOSIS — Z880 Allergy status to penicillin: Secondary | ICD-10-CM | POA: Insufficient documentation

## 2014-05-22 DIAGNOSIS — N760 Acute vaginitis: Secondary | ICD-10-CM | POA: Diagnosis not present

## 2014-05-22 DIAGNOSIS — J45909 Unspecified asthma, uncomplicated: Secondary | ICD-10-CM | POA: Insufficient documentation

## 2014-05-22 DIAGNOSIS — Z331 Pregnant state, incidental: Secondary | ICD-10-CM | POA: Insufficient documentation

## 2014-05-22 DIAGNOSIS — Z87898 Personal history of other specified conditions: Secondary | ICD-10-CM | POA: Diagnosis not present

## 2014-05-22 DIAGNOSIS — F17200 Nicotine dependence, unspecified, uncomplicated: Secondary | ICD-10-CM | POA: Diagnosis not present

## 2014-05-22 DIAGNOSIS — R300 Dysuria: Secondary | ICD-10-CM | POA: Insufficient documentation

## 2014-05-22 DIAGNOSIS — Z79899 Other long term (current) drug therapy: Secondary | ICD-10-CM | POA: Insufficient documentation

## 2014-05-22 DIAGNOSIS — N390 Urinary tract infection, site not specified: Secondary | ICD-10-CM | POA: Diagnosis not present

## 2014-05-22 DIAGNOSIS — B9689 Other specified bacterial agents as the cause of diseases classified elsewhere: Secondary | ICD-10-CM | POA: Diagnosis not present

## 2014-05-22 LAB — URINE MICROSCOPIC-ADD ON

## 2014-05-22 LAB — WET PREP, GENITAL
Trich, Wet Prep: NONE SEEN
Yeast Wet Prep HPF POC: NONE SEEN

## 2014-05-22 LAB — URINALYSIS, ROUTINE W REFLEX MICROSCOPIC
BILIRUBIN URINE: NEGATIVE
GLUCOSE, UA: NEGATIVE mg/dL
Hgb urine dipstick: NEGATIVE
KETONES UR: NEGATIVE mg/dL
NITRITE: NEGATIVE
PH: 7.5 (ref 5.0–8.0)
Protein, ur: NEGATIVE mg/dL
Specific Gravity, Urine: 1.026 (ref 1.005–1.030)
Urobilinogen, UA: 1 mg/dL (ref 0.0–1.0)

## 2014-05-22 LAB — PREGNANCY, URINE: Preg Test, Ur: POSITIVE — AB

## 2014-05-22 MED ORDER — PRENATAL COMPLETE 14-0.4 MG PO TABS: 1.0000 | ORAL_TABLET | Freq: Every day | ORAL | Status: DC

## 2014-05-22 MED ORDER — NITROFURANTOIN MONOHYD MACRO 100 MG PO CAPS: 100.0000 mg | ORAL_CAPSULE | Freq: Two times a day (BID) | ORAL | Status: DC

## 2014-05-22 MED ORDER — METRONIDAZOLE 500 MG PO TABS: 500.0000 mg | ORAL_TABLET | Freq: Two times a day (BID) | ORAL | Status: DC

## 2014-05-22 NOTE — ED Notes (Signed)
Pt. reports dysuria onset Sunday this week  , denies hematuria , no fever or chills.

## 2014-05-22 NOTE — ED Notes (Signed)
Pt reports last menses July 2nd.

## 2014-05-22 NOTE — ED Provider Notes (Signed)
CSN: [id number]     Arrival date & time 05/22/14  0454 History   First MD Initiated Contact with Patient 05/22/14 0719     Chief Complaint  Patient presents with  . Dysuria     HPI Pt was seen at 0720. Per pt, c/o gradual onset and persistence of constant dysuria for the past 1 week.  Denies flank pain, no fevers, no abd pain, no N/V/D, no vaginal bleeding/discharge, no rash.   LMP 02/28/14, Hx G1P1  Past Medical History  Diagnosis Date  . Asthma   . H/O: C-section   . Lipoma   . Lipoma of LUQ abdomen, s/p removal 06/27/2012 05/24/2012   Past Surgical History  Procedure Laterality Date  . Cesarean section  09/09/2010  . Breast surgery  12/30/11    breast reduction  . Abdmonial mass  2013   Family History  Problem Relation Age of Onset  . Kidney disease Mother     kidney stones  . Heart disease Maternal Uncle   . Stroke Maternal Uncle   . Cancer Maternal Grandmother     breast  . Cancer Maternal Grandfather   . Deep vein thrombosis Maternal Grandfather     in throat  . Diabetes Paternal Grandfather   . Cancer Maternal Aunt     breast   History  Substance Use Topics  . Smoking status: Current Every Day Smoker    Types: Cigarettes  . Smokeless tobacco: Never Used  . Alcohol Use: Yes   OB History   Grav Para Term Preterm Abortions TAB SAB Ect Mult Living   1 1 1       1      Review of Systems ROS: Statement: All systems negative except as marked or noted in the HPI; Constitutional: Negative for fever and chills. ; ; Eyes: Negative for eye pain, redness and discharge. ; ; ENMT: Negative for ear pain, hoarseness, nasal congestion, sinus pressure and sore throat. ; ; Cardiovascular: Negative for chest pain, palpitations, diaphoresis, dyspnea and peripheral edema. ; ; Respiratory: Negative for cough, wheezing and stridor. ; ; Gastrointestinal: Negative for nausea, vomiting, diarrhea, abdominal pain, blood in stool, hematemesis, jaundice and rectal bleeding. . ; ;  Genitourinary: +dysuria. Negative for flank pain and hematuria. ; ; GYN:  No vaginal bleeding, no vaginal discharge, no vulvar pain.;;  Musculoskeletal: Negative for back pain and neck pain. Negative for swelling and trauma.; ; Skin: Negative for pruritus, rash, abrasions, blisters, bruising and skin lesion.; ; Neuro: Negative for headache, lightheadedness and neck stiffness. Negative for weakness, altered level of consciousness , altered mental status, extremity weakness, paresthesias, involuntary movement, seizure and syncope.      Allergies  Penicillins; Asa buff (mag; Iodine; Peanut-containing drug products; and Shellfish allergy  Home Medications   Prior to Admission medications   Medication Sig Start Date End Date Taking? Authorizing Provider  albuterol (PROVENTIL HFA;VENTOLIN HFA) 108 (90 BASE) MCG/ACT inhaler Inhale 2 puffs into the lungs every 6 (six) hours as needed for wheezing or shortness of breath.   Yes Historical Provider, MD  EPINEPHrine 0.3 mg/0.3 mL IJ SOAJ injection Inject 0.3 mg into the muscle daily as needed (allergic reaction).   Yes Historical Provider, MD  Fluticasone-Salmeterol (ADVAIR) 250-50 MCG/DOSE AEPB Inhale 1 puff into the lungs 2 (two) times daily as needed (shortness of breath).    Yes Historical Provider, MD   BP 118/70  Pulse 94  Temp(Src) 98.3 F (36.8 C) (Oral)  Resp 20  SpO2 99%  LMP 04/08/2014 Physical Exam 0725: Physical examination:  Nursing notes reviewed; Vital signs and O2 SAT reviewed;  Constitutional: Well developed, Well nourished, Well hydrated, In no acute distress; Head:  Normocephalic, atraumatic; Eyes: EOMI, PERRL, No scleral icterus; ENMT: Mouth and pharynx normal, Mucous membranes moist; Neck: Supple, Full range of motion, No lymphadenopathy; Cardiovascular: Regular rate and rhythm, No murmur, rub, or gallop; Respiratory: Breath sounds clear & equal bilaterally, No rales, rhonchi, wheezes.  Speaking full sentences with ease, Normal  respiratory effort/excursion; Chest: Nontender, Movement normal; Abdomen: Soft, Nontender, Nondistended, Normal bowel sounds; Genitourinary: No CVA tenderness. Pelvic exam performed with permission of pt and female ED tech assist during exam.  External genitalia w/o lesions. Vaginal vault with thin yellow discharge.  Cervix w/o lesions, not friable, GC/chlam and wet prep obtained and sent to lab.  Bimanual exam w/o CMT, uterine or adnexal tenderness.;; Extremities: Pulses normal, No tenderness, No edema, No calf edema or asymmetry.; Neuro: AA&Ox3, Major CN grossly intact.  Speech clear. No gross focal motor or sensory deficits in extremities. Climbs on and off stretcher easily by herself. Gait steady.; Skin: Color normal, Warm, Dry.   ED Course  Procedures     EKG Interpretation None      MDM  MDM Reviewed: previous chart, nursing note and vitals Interpretation: labs     Results for orders placed during the hospital encounter of 05/22/14  WET PREP, GENITAL      Result Value Ref Range   Yeast Wet Prep HPF POC NONE SEEN  NONE SEEN   Trich, Wet Prep NONE SEEN  NONE SEEN   Clue Cells Wet Prep HPF POC MODERATE (*) NONE SEEN   WBC, Wet Prep HPF POC FEW (*) NONE SEEN  URINALYSIS, ROUTINE W REFLEX MICROSCOPIC      Result Value Ref Range   Color, Urine YELLOW  YELLOW   APPearance CLOUDY (*) CLEAR   Specific Gravity, Urine 1.026  1.005 - 1.030   pH 7.5  5.0 - 8.0   Glucose, UA NEGATIVE  NEGATIVE mg/dL   Hgb urine dipstick NEGATIVE  NEGATIVE   Bilirubin Urine NEGATIVE  NEGATIVE   Ketones, ur NEGATIVE  NEGATIVE mg/dL   Protein, ur NEGATIVE  NEGATIVE mg/dL   Urobilinogen, UA 1.0  0.0 - 1.0 mg/dL   Nitrite NEGATIVE  NEGATIVE   Leukocytes, UA SMALL (*) NEGATIVE  PREGNANCY, URINE      Result Value Ref Range   Preg Test, Ur POSITIVE (*) NEGATIVE  URINE MICROSCOPIC-ADD ON      Result Value Ref Range   Squamous Epithelial / LPF MANY (*) RARE   WBC, UA 7-10  <3 WBC/hpf   RBC / HPF 0-2  <3  RBC/hpf   Bacteria, UA MANY (*) RARE     1000:  Pt denies pelvic pain. Abd/pelvic exam benign on initial and re-exams and VS remain stable; doubt ectopic at this time. Will tx for BV and UTI (UC pending). Dx and testing d/w pt.  Questions answered.  Verb understanding, agreeable to d/c home with outpt f/u.     Francine Graven, DO 05/24/14 2143

## 2014-05-22 NOTE — Discharge Instructions (Signed)
°Emergency Department Resource Guide °1) Find a Doctor and Pay Out of Pocket °Although you won't have to find out who is covered by your insurance plan, it is a good idea to ask around and get recommendations. You will then need to call the office and see if the doctor you have chosen will accept you as a new patient and what types of options they offer for patients who are self-pay. Some doctors offer discounts or will set up payment plans for their patients who do not have insurance, but you will need to ask so you aren't surprised when you get to your appointment. ° °2) Contact Your Local Health Department °Not all health departments have doctors that can see patients for sick visits, but many do, so it is worth a call to see if yours does. If you don't know where your local health department is, you can check in your phone book. The CDC also has a tool to help you locate your state's health department, and many state websites also have listings of all of their local health departments. ° °3) Find a Walk-in Clinic °If your illness is not likely to be very severe or complicated, you may want to try a walk in clinic. These are popping up all over the country in pharmacies, drugstores, and shopping centers. They're usually staffed by nurse practitioners or physician assistants that have been trained to treat common illnesses and complaints. They're usually fairly quick and inexpensive. However, if you have serious medical issues or chronic medical problems, these are probably not your best option. ° °No Primary Care Doctor: °- Call Health Connect at  832-8000 - they can help you locate a primary care doctor that  accepts your insurance, provides certain services, etc. °- Physician Referral Service- 1-800-533-3463 ° °Chronic Pain Problems: °Organization         Address  Phone   Notes  °[facility]  (336) 297-2271 Patients need to be referred by their primary care doctor.  ° °Medication  Assistance: °Organization         Address  Phone   Notes  °[place] [facility] [address] °[place], [place] [address] (336) 641-8030 --Must be a resident of [place] °-- Must have NO insurance coverage whatsoever (no Medicaid/ Medicare, etc.) °-- The pt. MUST have a primary care doctor that directs their care regularly and follows them in the community °  °[facility]  (866) 331-1348   °[employer]  (888) 892-1162   ° °Agencies that provide inexpensive medical care: °Organization         Address  Phone   Notes  °[facility]  (336) 832-8035   °[facility]    (336) 832-7272   °[facility] [address] °[place], [place] [address] (336) 832-4777   °[facility] [address], °[place] (336) 271-4999   °[facility]    (336) 373-0678   °[facility]    (336) 272-1050   °[facility] ° [address], [place] Phone:  (336) 832-4444, Fax:  (336) 832-4440 Hours of Operation:  9 am - 6 pm, M-F.  Also accepts Medicaid/Medicare and self-pay.  °[facility] ° [address],  Phone: (336) 832-3150, Fax: (336) 832-3151. Hours of Operation:  8:30 am - 5:30 pm, M-F.  Also accepts Medicaid and self-pay.  °[facility] [place] [address], [place] Phone: (336) 878-6027   °[facility] [address], [place], [place] (336)723-1848, Ext. 123 Mondays & Thursdays: 7-9 AM.  First 15 patients are seen on a first come, first serve basis. °  ° °Medicaid-accepting [place] Providers: ° °Organization         Address  Phone   Notes  °[facility] [address], [place] (336) 641-2100 Also accepts self-pay patients.  °[facility] [address], [place] ° (336) 856-9996   °[facility] [address], [place]  (336) 288-8857   °[facility] [address] [place] [address], [place] (336) 299-7000   °Veita Bland [address], [place]  ° (336) 373-1557 Only accepts [place] Access Medicaid patients after they have their name applied to their card.  ° °Self-Pay (no insurance) in [place]: ° °Organization         Address  Phone   Notes  °Sickle Cell Patients, [facility] [address], [place] (336) 832-1970   °[facility] [address], [place] (336) 832-4400   °[facility] ° [address], [place] (336) 992-4800   °[facility]/Dr. Osei-Bonsu ° [address] [place] [address], [place] or [address], [place] (336) 841-8500 Phone number for both [place] and [place] locations is the same.  °[facility] [address], [place] (336) 299-0000   °[facility] [address] [place] [address], [place] or [address] (336) 852-7530 °(336) 878-2260   °[facility] [address], [place] (336) 350-1642, phone; (336) 294-5005, fax Sees patients 1st and 3rd Saturday of every month.  Must not qualify for public or private insurance (i.e. Medicaid, Medicare, [place] Health Choice, Veterans' Benefits) • Household income should be no more than 200% of the poverty level •The clinic cannot treat you if you are pregnant or think you are pregnant • Sexually transmitted diseases are not treated at the clinic.  ° ° °Dental Care: °Organization         Address  Phone  Notes  °[place] [facility] [address], [place] (336) 641-6152 Accepts children up to age 21 who are enrolled in Medicaid or [place] Health Choice; pregnant women with a Medicaid card; and children who have applied for Medicaid or [place] Health Choice, but were declined, whose parents can pay a reduced fee at time of service.  °[place]  [facility] [place]  [address], [place] (336) 641-7733 Accepts children up to age 21 who are enrolled in Medicaid or [place] Health Choice; pregnant women with a Medicaid card; and children who have applied for Medicaid or [place] Health Choice, but were declined, whose parents can pay a reduced fee at time of service.  °[facility] ° [address], [place] (336) 641-4533 Patients are seen by appointment only. Walk-ins are not accepted. [facility] will see patients 18 years of age and older. °Monday - Tuesday (8am-5pm) °Most Wednesdays (8:30-5pm) °$30 per visit, cash only  °[facility] ° [address], [place] (336) 641-4533 Patients are seen by appointment only. Walk-ins are not accepted. [facility] will see patients 18 years of age and older. °One   Wednesday Evening (Monthly: Volunteer Based).  $30 per visit, cash only  °[facility]  (919) 537-3737 for adults; Children under age 4, call Graduate Pediatric Dentistry at (919) [phone]. Children aged 4-14, please call (919) [phone] to request a pediatric application. ° Dental services are provided in all areas of dental care including fillings, crowns and bridges, complete and partial dentures, implants, gum treatment, root canals, and extractions. Preventive care is also provided. Treatment is provided to both adults and children. °Patients are selected via a lottery and there is often a waiting list. °  °[facility] [address], °[place] ° (336) 763-8833 www.drcivils.[email or web address] °  °[facility] [address], [place], [place] (336)723-1848, Ext. 123 Second and Fourth Thursday of each month, opens at 6:30 AM; Clinic ends at 9 AM.  Patients are seen on a first-come first-served basis, and a limited number are seen during each clinic.  ° °[facility] ° [address], [place], [place] (336) 723-7904    Eligibility Requirements °You must have lived in [place], [place], or [place] counties for at least the last three months. °  You cannot be eligible for state or federal sponsored healthcare insurance, including Veterans Administration, Medicaid, or Medicare. °  You generally cannot be eligible for healthcare insurance through your employer.  °  How to apply: °Eligibility screenings are held every Tuesday and Wednesday afternoon from 1:00 pm until 4:00 pm. You do not need an appointment for the interview!  °[facility] [address], [place], [place] 336-631-2330   °[facility]  336-342-8273   °[facility]  336-703-3100   °[facility]  336-570-6415   ° °Behavioral Health Resources in the Community: °Intensive Outpatient Programs °Organization         Address  Phone  Notes  °[facility] [address], [place], [place] 336-878-6098   °[facility] [address], [place], [place] 336-832-9800   °[facility] [address], [place], [place] ° 336-882-2125   °[facility] [address],  °[place], [place] 1-800-853-5163 or 336-641-4981   °Substance Abuse Resources °Organization         Address  Phone  Notes  °[facility]  336-882-2125   °[facility]  336-784-9470   °[facility] [place] [facility]  336-285-9073   °[facility]  336-845-3988   °Residential & Outpatient Substance Abuse Program  1-800-659-3381   °Psychological Services °Organization         Address  Phone  Notes  °[facility]  336- 832-9600   °[facility]  336- 378-7881   °[facility] [address], [place] 1-800-853-5163 or 336-641-4981   ° °Mobile Crisis Teams °Organization         Address  Phone  Notes  °[facility], Mobile Crisis Care Unit  1-877-626-1772   °Assertive °[facility] ° [address]  [place], [place] 336-834-9664   °Sharon DeEsch [address] °[place] 336-554-5454   ° °Self-Help/Support Groups °Organization         Address  Phone             Notes  °[facility] - variety of support groups  336- 373-1402 Call for more information  °Narcotics Anonymous (NA), [facility] [address], °[place]  2 meetings at this location  ° °  Residential Treatment Programs Organization         Address  Phone  Notes  [facility] [address]    [place]  1-330-572-2093   [facility]  [address] [place] [address], [place], [place]   [place], [address] 905-384-8378 Admissions: 8am-3pm M-F  [facility] [place] [address],    [place], [place] [phone]   [facility] [address] [place], [place], [place]   [facility] [address],  [place], [place]   [facility] Intensive Outpatient [address], [place], [place]   [facility] [address]  [place], [place] 1-407-031-4639 or 650-184-7945   [facility] [address] [place], [place] Accepts Medicaid  [facility] [address],  [place] 1-(309)401-5525 Substance Abuse/Addiction Treatment   [facility]         Address  Phone  Notes  [facility]  (213) 366-2895   Domenic Schwab, PhD [address] [place], [place]   904-388-9514 or 951-067-3535   [place], [place] 850-512-3815   [facility] [address] [place], [place] (438)[phone] Insurance/Medicaid/sponsorship through [facility] [address] [place]                                    [place], [place] [phone] [place] [address], [place] (610)[phone]    Dr. Adele Schilder  [phone]   [facility] Dept. 1) [address] [place] 2) [place] 3)  [address], [place] [phone]  [phone]   [place] [phone] or ([phone] (After Hours)      Take the prescriptions as directed.  Call your regular medical doctor and the OB/GYN doctor today to schedule a follow up appointment within the next week.  Return to the Emergency Department immediately sooner if worsening.

## 2014-05-23 LAB — GC/CHLAMYDIA PROBE AMP
CT PROBE, AMP APTIMA: NEGATIVE
GC PROBE AMP APTIMA: NEGATIVE

## 2014-05-24 LAB — URINE CULTURE

## 2014-06-08 ENCOUNTER — Inpatient Hospital Stay (HOSPITAL_COMMUNITY)
Admission: AD | Admit: 2014-06-08 | Discharge: 2014-06-08 | Disposition: A | Discharge status: Home / Self Care | Payer: Medicaid Other | Source: Ambulatory Visit | Attending: Family Medicine | Admitting: Family Medicine

## 2014-06-08 ENCOUNTER — Encounter (HOSPITAL_COMMUNITY): Payer: Self-pay

## 2014-06-08 DIAGNOSIS — Z3A08 8 weeks gestation of pregnancy: Secondary | ICD-10-CM | POA: Diagnosis not present

## 2014-06-08 DIAGNOSIS — O99891 Other specified diseases and conditions complicating pregnancy: Secondary | ICD-10-CM

## 2014-06-08 DIAGNOSIS — K210 Gastro-esophageal reflux disease with esophagitis: Secondary | ICD-10-CM | POA: Diagnosis not present

## 2014-06-08 DIAGNOSIS — O99820 Streptococcus B carrier state complicating pregnancy: Secondary | ICD-10-CM | POA: Insufficient documentation

## 2014-06-08 DIAGNOSIS — K219 Gastro-esophageal reflux disease without esophagitis: Secondary | ICD-10-CM

## 2014-06-08 DIAGNOSIS — O9989 Other specified diseases and conditions complicating pregnancy, childbirth and the puerperium: Secondary | ICD-10-CM | POA: Diagnosis not present

## 2014-06-08 DIAGNOSIS — M549 Dorsalgia, unspecified: Secondary | ICD-10-CM

## 2014-06-08 DIAGNOSIS — F17210 Nicotine dependence, cigarettes, uncomplicated: Secondary | ICD-10-CM | POA: Diagnosis not present

## 2014-06-08 DIAGNOSIS — O2341 Unspecified infection of urinary tract in pregnancy, first trimester: Secondary | ICD-10-CM

## 2014-06-08 DIAGNOSIS — B951 Streptococcus, group B, as the cause of diseases classified elsewhere: Secondary | ICD-10-CM

## 2014-06-08 LAB — URINALYSIS, ROUTINE W REFLEX MICROSCOPIC
Bilirubin Urine: NEGATIVE
Glucose, UA: NEGATIVE mg/dL
Hgb urine dipstick: NEGATIVE
KETONES UR: 15 mg/dL — AB
LEUKOCYTES UA: NEGATIVE
NITRITE: NEGATIVE
PROTEIN: NEGATIVE mg/dL
Specific Gravity, Urine: 1.02 (ref 1.005–1.030)
Urobilinogen, UA: 1 mg/dL (ref 0.0–1.0)
pH: 7 (ref 5.0–8.0)

## 2014-06-08 MED ORDER — RANITIDINE HCL 150 MG PO TABS: 150.0000 mg | ORAL_TABLET | Freq: Two times a day (BID) | ORAL | Status: DC

## 2014-06-08 MED ORDER — AMOXICILLIN 500 MG PO CAPS: 500.0000 mg | ORAL_CAPSULE | Freq: Three times a day (TID) | ORAL | Status: DC

## 2014-06-08 NOTE — MAU Provider Note (Signed)
History     CSN: [id number]  Arrival date and time: 06/08/14 2542   First Provider Initiated Contact with Patient 06/08/14 [phone]      Chief Complaint  Patient presents with  . Back Pain   HPI   Kendra [name] is a 28 y.o. female G2P1001 at [redacted]w[redacted]d who presents with back pain in pregnancy. She was seen at [facility] a few days ago and treated for a UTI; she continues to have back pain. She is scheduled for an appointment with Dr.Cole this Thursday for her first prenatal appointment. She denies vaginal bleeding or abdominal pain. She does attest to heartburn.   OB History   Grav Para Term Preterm Abortions TAB SAB Ect Mult Living   2 1 1       1       Past Medical History  Diagnosis Date  . Asthma   . H/O: C-section   . Lipoma   . Lipoma of LUQ abdomen, s/p removal 06/27/2012 05/24/2012    Past Surgical History  Procedure Laterality Date  . Cesarean section  09/09/2010  . Breast surgery  12/30/11    breast reduction  . Abdmonial mass  2013    Family History  Problem Relation Age of Onset  . Kidney disease Mother     kidney stones  . Heart disease Maternal Uncle   . Stroke Maternal Uncle   . Cancer Maternal Grandmother     breast  . Cancer Maternal Grandfather   . Deep vein thrombosis Maternal Grandfather     in throat  . Diabetes Paternal Grandfather   . Cancer Maternal Aunt     breast    History  Substance Use Topics  . Smoking status: Current Every Day Smoker    Types: Cigarettes  . Smokeless tobacco: Never Used  . Alcohol Use: No    Allergies:  Allergies  Allergen Reactions  . Asa Buff (Mag [Buffered Aspirin] Swelling  . Iodine Hives and Swelling  . Shellfish Allergy Hives and Swelling    Pt throat swells    Prescriptions prior to admission  Medication Sig Dispense Refill  . albuterol (PROVENTIL HFA;VENTOLIN HFA) 108 (90 BASE) MCG/ACT inhaler Inhale 2 puffs into the lungs every 6 (six) hours as needed for wheezing or shortness of breath.      .  Prenatal Vit-Fe Fumarate-FA (PRENATAL MULTIVITAMIN) TABS tablet Take 1 tablet by mouth daily at 12 noon.      [place] Kitchen EPINEPHrine 0.3 mg/0.3 mL IJ SOAJ injection Inject 0.3 mg into the muscle daily as needed (allergic reaction).      . Fluticasone-Salmeterol (ADVAIR) 250-50 MCG/DOSE AEPB Inhale 1 puff into the lungs 2 (two) times daily as needed (shortness of breath).        Results for orders placed during the hospital encounter of 06/08/14 (from the past 48 hour(s))  URINALYSIS, ROUTINE W REFLEX MICROSCOPIC     Status: Abnormal   Collection Time    06/08/14  8:50 AM      Result Value Ref Range   Color, Urine YELLOW  YELLOW   APPearance CLEAR  CLEAR   Specific Gravity, Urine 1.020  1.005 - 1.030   pH 7.0  5.0 - 8.0   Glucose, UA NEGATIVE  NEGATIVE mg/dL   Hgb urine dipstick NEGATIVE  NEGATIVE   Bilirubin Urine NEGATIVE  NEGATIVE   Ketones, ur 15 (*) NEGATIVE mg/dL   Protein, ur NEGATIVE  NEGATIVE mg/dL   Urobilinogen, UA 1.0  0.0 - 1.0  mg/dL   Nitrite NEGATIVE  NEGATIVE   Leukocytes, UA NEGATIVE  NEGATIVE   Comment: MICROSCOPIC NOT DONE ON URINES WITH NEGATIVE PROTEIN, BLOOD, LEUKOCYTES, NITRITE, OR GLUCOSE <1000 mg/dL.    Review of Systems  Constitutional: Negative for fever and chills.  Cardiovascular: Positive for leg swelling.  Gastrointestinal: Positive for heartburn. Negative for nausea, vomiting and abdominal pain (Upper abdominal pain ).  Genitourinary: Negative for dysuria, urgency and frequency.       No vaginal discharge. No vaginal bleeding. No dysuria.    Physical Exam   Blood pressure 126/73, pulse 93, temperature 98.2 F (36.8 C), temperature source Oral, resp. rate 18, height 5\' 1"  (1.549 m), weight 98.601 kg (217 lb 6 oz), last menstrual period 04/08/2014.  Physical Exam  Constitutional: She is oriented to person, place, and time. She appears well-developed and well-nourished. No distress.  HENT:  Head: Normocephalic.  Eyes: Pupils are equal, round, and reactive  to light.  Neck: Neck supple.  Respiratory: Effort normal.  GI: Soft. Normal appearance. There is tenderness in the epigastric area. There is no rigidity, no rebound, no guarding and no CVA tenderness.  Musculoskeletal: Normal range of motion.  Neurological: She is alert and oriented to person, place, and time.  Skin: Skin is warm. She is not diaphoretic.  Psychiatric: Her behavior is normal.    MAU Course  Procedures None  MDM UA  Urine culture showed +GBS> patient completed macrobid. I will RX amoxicillin. Discussed this with the patient.   Assessment and Plan   A:  1. GBS (group B streptococcus) UTI complicating pregnancy, first trimester   2. Back pain in pregnancy   3. Gastroesophageal reflux disease, esophagitis presence not specified     P:  Discharge home in stable condition  RX: Amoxicillin, Zantac Keep your scheduled appointment with Dr. Landry Mellow Return to MAU as needed, if symptoms worsen First trimester warning signs discussed  Ketones in urine> increase water intake.    Kendra [name] Kendra Marietta, NP 06/08/2014 2:25 PM

## 2014-06-08 NOTE — MAU Note (Signed)
Pt states was seen 05/22/2014, found out she was pregnant, dx'd with UTI. Lower back pain x2 weeks, moreso on her left side. Feels like she still has UTI.

## 2014-06-08 NOTE — MAU Provider Note (Signed)
Attestation of Attending Supervision of Advanced Practitioner (PA/CNM/NP): Evaluation and management procedures were performed by the Advanced Practitioner under my supervision and collaboration.  I have reviewed the Advanced Practitioner's note and chart, and I agree with the management and plan.  Jacob Stinson, DO Attending Physician Faculty Practice, [facility]  

## 2014-06-13 LAB — OB RESULTS CONSOLE ABO/RH: RH Type: POSITIVE

## 2014-06-13 LAB — OB RESULTS CONSOLE ANTIBODY SCREEN: Antibody Screen: NEGATIVE

## 2014-06-13 LAB — OB RESULTS CONSOLE RPR: RPR: NONREACTIVE

## 2014-06-13 LAB — OB RESULTS CONSOLE HIV ANTIBODY (ROUTINE TESTING): HIV: NONREACTIVE

## 2014-07-01 ENCOUNTER — Encounter (HOSPITAL_COMMUNITY): Payer: Self-pay

## 2014-10-08 ENCOUNTER — Inpatient Hospital Stay (HOSPITAL_COMMUNITY)
Admission: AD | Admit: 2014-10-08 | Discharge: 2014-10-09 | Disposition: A | Discharge status: Home / Self Care | Payer: Medicaid Other | Source: Ambulatory Visit | Attending: Obstetrics and Gynecology | Admitting: Obstetrics and Gynecology

## 2014-10-08 ENCOUNTER — Encounter (HOSPITAL_COMMUNITY): Payer: Self-pay | Admitting: *Deleted

## 2014-10-08 DIAGNOSIS — Z3A26 26 weeks gestation of pregnancy: Secondary | ICD-10-CM | POA: Insufficient documentation

## 2014-10-08 DIAGNOSIS — F17210 Nicotine dependence, cigarettes, uncomplicated: Secondary | ICD-10-CM | POA: Diagnosis not present

## 2014-10-08 DIAGNOSIS — O212 Late vomiting of pregnancy: Secondary | ICD-10-CM | POA: Diagnosis present

## 2014-10-08 DIAGNOSIS — O99332 Smoking (tobacco) complicating pregnancy, second trimester: Secondary | ICD-10-CM | POA: Insufficient documentation

## 2014-10-08 DIAGNOSIS — A084 Viral intestinal infection, unspecified: Secondary | ICD-10-CM | POA: Diagnosis not present

## 2014-10-08 DIAGNOSIS — O9989 Other specified diseases and conditions complicating pregnancy, childbirth and the puerperium: Secondary | ICD-10-CM | POA: Diagnosis not present

## 2014-10-08 LAB — URINALYSIS, ROUTINE W REFLEX MICROSCOPIC
Bilirubin Urine: NEGATIVE
Glucose, UA: NEGATIVE mg/dL
KETONES UR: NEGATIVE mg/dL
NITRITE: NEGATIVE
PROTEIN: NEGATIVE mg/dL
Specific Gravity, Urine: 1.01 (ref 1.005–1.030)
UROBILINOGEN UA: 0.2 mg/dL (ref 0.0–1.0)
pH: 6 (ref 5.0–8.0)

## 2014-10-08 LAB — URINE MICROSCOPIC-ADD ON

## 2014-10-08 MED ORDER — PROMETHAZINE HCL 25 MG/ML IJ SOLN
25.0000 mg | Freq: Once | INTRAVENOUS | Status: AC
  Administered 2014-10-08: 25 mg via INTRAVENOUS
  Filled 2014-10-08: qty 1

## 2014-10-08 NOTE — MAU Provider Note (Signed)
History     CSN: [id number]  Arrival date and time: 10/08/14 2113   First Provider Initiated Contact with Patient 10/08/14 2215      No chief complaint on file.  HPI  Kendra [name] is a 29 y.o. G2P1001 at [redacted]w[redacted]d who presents today with nausea/vomiting/diarrhea. She states that this started at 0500 today. She states that before bed last night she wasn't feeling well, and then started vomiting when she woke this morning. She denies any close contacts who have been sick. She denies any VB or LOF. She confirms fetal movement. She states that she has not been able to eat or drink today.   Past Medical History  Diagnosis Date  . Asthma   . H/O: C-section   . Lipoma   . Lipoma of LUQ abdomen, s/p removal 06/27/2012 05/24/2012    Past Surgical History  Procedure Laterality Date  . Cesarean section  09/09/2010  . Breast surgery  12/30/11    breast reduction  . Abdmonial mass  2013    Family History  Problem Relation Age of Onset  . Kidney disease Mother     kidney stones  . Heart disease Maternal Uncle   . Stroke Maternal Uncle   . Cancer Maternal Grandmother     breast  . Cancer Maternal Grandfather   . Deep vein thrombosis Maternal Grandfather     in throat  . Diabetes Paternal Grandfather   . Cancer Maternal Aunt     breast    History  Substance Use Topics  . Smoking status: Current Every Day Smoker    Types: Cigarettes  . Smokeless tobacco: Never Used  . Alcohol Use: No    Allergies:  Allergies  Allergen Reactions  . Asa Buff (Mag [Buffered Aspirin] Swelling  . Iodine Hives and Swelling  . Shellfish Allergy Hives and Swelling    Pt throat swells    Prescriptions prior to admission  Medication Sig Dispense Refill Last Dose  . acetaminophen (TYLENOL) 500 MG tablet Take 500 mg by mouth every 6 (six) hours as needed for mild pain.   10/08/2014 at Unknown time  . albuterol (PROVENTIL HFA;VENTOLIN HFA) 108 (90 BASE) MCG/ACT inhaler Inhale 2 puffs into the lungs every  6 (six) hours as needed for wheezing or shortness of breath.   10/08/2014 at Unknown time  . Prenatal Vit-Fe Fumarate-FA (PRENATAL MULTIVITAMIN) TABS tablet Take 1 tablet by mouth daily at 12 noon.   10/07/2014 at Unknown time  . ranitidine (ZANTAC) 150 MG tablet Take 1 tablet (150 mg total) by mouth 2 (two) times daily. 60 tablet 1 10/08/2014 at Unknown time  . amoxicillin (AMOXIL) 500 MG capsule Take 1 capsule (500 mg total) by mouth 3 (three) times daily. (Patient not taking: Reported on 10/08/2014) 21 capsule 0 Completed Course at Unknown time  . EPINEPHrine 0.3 mg/0.3 mL IJ SOAJ injection Inject 0.3 mg into the muscle daily as needed (allergic reaction).   Emergency    ROS Physical Exam   Blood pressure 110/74, pulse 96, temperature 98.4 F (36.9 C), temperature source Oral, resp. rate 18, last menstrual period 04/08/2014, SpO2 98 %.  Physical Exam  Nursing note and vitals reviewed. Constitutional: She is oriented to person, place, and time. She appears well-developed and well-nourished. No distress.  Cardiovascular: Normal rate.   Respiratory: Effort normal.  GI: Soft. There is no tenderness. There is no rebound.  Neurological: She is alert and oriented to person, place, and time.  Skin: Skin is warm  and dry.  Psychiatric: She has a normal mood and affect.    FHT 155, moderate with 15x15 accles, no decels Toco: no UCs  MAU Course  Procedures  Results for orders placed or performed during the hospital encounter of 10/08/14 (from the past 24 hour(s))  Urinalysis, Routine w reflex microscopic     Status: Abnormal   Collection Time: 10/08/14  9:34 PM  Result Value Ref Range   Color, Urine YELLOW YELLOW   APPearance CLEAR CLEAR   Specific Gravity, Urine 1.010 1.005 - 1.030   pH 6.0 5.0 - 8.0   Glucose, UA NEGATIVE NEGATIVE mg/dL   Hgb urine dipstick TRACE (A) NEGATIVE   Bilirubin Urine NEGATIVE NEGATIVE   Ketones, ur NEGATIVE NEGATIVE mg/dL   Protein, ur NEGATIVE NEGATIVE mg/dL    Urobilinogen, UA 0.2 0.0 - 1.0 mg/dL   Nitrite NEGATIVE NEGATIVE   Leukocytes, UA TRACE (A) NEGATIVE  Urine microscopic-add on     Status: Abnormal   Collection Time: 10/08/14  9:34 PM  Result Value Ref Range   Squamous Epithelial / LPF FEW (A) RARE   WBC, UA 0-2 <3 WBC/hpf   RBC / HPF 0-2 <3 RBC/hpf   Bacteria, UA FEW (A) RARE    2345: Called Dr. Landry Mellow. She informed that she was not on call. She states that a [facility] MD is on call.  2346: Called Dr. Charlesetta Garibaldi. She was in the OR with a c-section. She could not come to the phone at this time. Asked nurse to pass message that I have a patient here ready for DC. She states that she "may be awhile".  0101: D/W Dr. Charlesetta Garibaldi ok for dc home.  Assessment and Plan   1. Viral gastroenteritis    BRAT diet Fetal kick counts  PTL precautions  Return to MAU as needed  Follow-up Information    Follow up with Catha Brow., MD.   Specialty:  Obstetrics and Gynecology   Why:  As scheduled   Contact information:   [address] [place] [address] [phone]       Mathis Bud 10/09/2014, 2:17 AM

## 2014-10-08 NOTE — MAU Note (Signed)
Pt states she  Feel dehydrated.Pt states she has not been able to keep anything down. Pt states she has n,v,d today. Pt states she has had 4 episodes of diarrhea.

## 2014-10-09 DIAGNOSIS — O9989 Other specified diseases and conditions complicating pregnancy, childbirth and the puerperium: Secondary | ICD-10-CM

## 2014-10-09 DIAGNOSIS — A084 Viral intestinal infection, unspecified: Secondary | ICD-10-CM

## 2014-10-09 DIAGNOSIS — Z3A26 26 weeks gestation of pregnancy: Secondary | ICD-10-CM

## 2014-10-09 NOTE — MAU Note (Signed)
Pt placed on Enteric Precautions after speaking with pt, pt admitted to having 4 episodes of diarrhea since morning.

## 2014-10-09 NOTE — Progress Notes (Signed)
Provider informed of low blood pressure after IV hydration.

## 2014-10-09 NOTE — Progress Notes (Signed)
Pt discharged during downtime

## 2014-12-10 ENCOUNTER — Telehealth: Payer: Self-pay

## 2014-12-10 NOTE — Telephone Encounter (Signed)
Patient call reporting sinus infection and requesting medication information.  Patient states she is having difficulties with sore throat, nasal congestion, and productive cough. Patient states nasal discharge is light green in color. Patient states symptoms have been going on for about 2 weeks, but that they initially presented as allergy like symptoms. She has taken claritin with no relief and states "I was sneezing and getting it out so I wasn't so worried about it."  Patient unsure of fever, but denies chills, nausea, and vomiting.  Patient educated on nonpharmacologic methods including humidifier, warm tea, and hydration.  Patient also given medication information regarding sudafed, robitussin, and benadryl dosing for sleep.  Patient encouraged to attempt above and report to doctor if symptoms worsen or are not showing signs of resolution in 2 days.  Patient verbalized understanding.  No other questions or concerns.  Patient encouraged to call back if any should arise. JE, CNM

## 2014-12-14 ENCOUNTER — Encounter (HOSPITAL_COMMUNITY): Payer: Self-pay

## 2014-12-14 ENCOUNTER — Inpatient Hospital Stay (HOSPITAL_COMMUNITY)
Admission: AD | Admit: 2014-12-14 | Discharge: 2014-12-14 | Disposition: A | Discharge status: Home / Self Care | Payer: Medicaid Other | Source: Ambulatory Visit | Attending: Obstetrics & Gynecology | Admitting: Obstetrics & Gynecology

## 2014-12-14 DIAGNOSIS — Z3A35 35 weeks gestation of pregnancy: Secondary | ICD-10-CM | POA: Diagnosis not present

## 2014-12-14 DIAGNOSIS — O2343 Unspecified infection of urinary tract in pregnancy, third trimester: Secondary | ICD-10-CM | POA: Diagnosis not present

## 2014-12-14 DIAGNOSIS — O4703 False labor before 37 completed weeks of gestation, third trimester: Secondary | ICD-10-CM

## 2014-12-14 DIAGNOSIS — O99333 Smoking (tobacco) complicating pregnancy, third trimester: Secondary | ICD-10-CM | POA: Insufficient documentation

## 2014-12-14 DIAGNOSIS — N390 Urinary tract infection, site not specified: Secondary | ICD-10-CM

## 2014-12-14 DIAGNOSIS — F17210 Nicotine dependence, cigarettes, uncomplicated: Secondary | ICD-10-CM | POA: Insufficient documentation

## 2014-12-14 LAB — URINALYSIS, ROUTINE W REFLEX MICROSCOPIC
BILIRUBIN URINE: NEGATIVE
GLUCOSE, UA: NEGATIVE mg/dL
KETONES UR: NEGATIVE mg/dL
Leukocytes, UA: NEGATIVE
NITRITE: POSITIVE — AB
PROTEIN: NEGATIVE mg/dL
Specific Gravity, Urine: >1.03 — ABNORMAL HIGH (ref 1.005–1.030)
UROBILINOGEN UA: 0.2 mg/dL (ref 0.0–1.0)
pH: 6 (ref 5.0–8.0)

## 2014-12-14 LAB — URINE MICROSCOPIC-ADD ON

## 2014-12-14 MED ORDER — NALBUPHINE HCL 10 MG/ML IJ SOLN
10.0000 mg | Freq: Once | INTRAMUSCULAR | Status: AC
  Administered 2014-12-14: 10 mg via INTRAMUSCULAR
  Filled 2014-12-14: qty 1

## 2014-12-14 MED ORDER — NITROFURANTOIN MONOHYD MACRO 100 MG PO CAPS: 100.0000 mg | ORAL_CAPSULE | Freq: Two times a day (BID) | ORAL | Status: DC

## 2014-12-14 MED ORDER — ALBUTEROL SULFATE HFA 108 (90 BASE) MCG/ACT IN AERS: 2.0000 | INHALATION_SPRAY | RESPIRATORY_TRACT | Status: DC | PRN

## 2014-12-14 NOTE — MAU Note (Signed)
Having contractions since 1700 and a lot of vaginal pressure. Denies LOF or bleeding

## 2014-12-14 NOTE — MAU Provider Note (Signed)
History     CSN: [id number]  Arrival date and time: 12/14/14 1918   First Provider Initiated Contact with Patient 12/14/14 1950      Chief Complaint  Patient presents with  . Vaginal Pain  . Contractions   HPI 29 y.o. G2P1001 at [redacted]w[redacted]d w/ contractions starting around 1700 tonight, q 5-10 minutes, not worsening in frequency or intensity, + fetal movement, denies bleeding or LOF. Uncomplicated prenatal course.   Past Medical History  Diagnosis Date  . Asthma   . H/O: C-section   . Lipoma   . Lipoma of LUQ abdomen, s/p removal 06/27/2012 05/24/2012    Past Surgical History  Procedure Laterality Date  . Cesarean section  09/09/2010  . Breast surgery  12/30/11    breast reduction  . Abdmonial mass  2013    Family History  Problem Relation Age of Onset  . Kidney disease Mother     kidney stones  . Heart disease Maternal Uncle   . Stroke Maternal Uncle   . Cancer Maternal Grandmother     breast  . Cancer Maternal Grandfather   . Deep vein thrombosis Maternal Grandfather     in throat  . Diabetes Paternal Grandfather   . Cancer Maternal Aunt     breast    History  Substance Use Topics  . Smoking status: Current Every Day Smoker    Types: Cigarettes  . Smokeless tobacco: Never Used  . Alcohol Use: No    Allergies:  Allergies  Allergen Reactions  . Asa Buff (Mag [Buffered Aspirin] Swelling  . Iodine Hives and Swelling  . Shellfish Allergy Hives and Swelling    Pt throat swells    Prescriptions prior to admission  Medication Sig Dispense Refill Last Dose  . acetaminophen (TYLENOL) 500 MG tablet Take 500 mg by mouth every 6 (six) hours as needed for mild pain.   Past Month at Unknown time  . albuterol (PROVENTIL HFA;VENTOLIN HFA) 108 (90 BASE) MCG/ACT inhaler Inhale 2 puffs into the lungs every 6 (six) hours as needed for wheezing or shortness of breath.   12/13/2014 at Unknown time  . loratadine (CLARITIN) 10 MG tablet Take 10 mg by mouth daily.   12/13/2014 at  Unknown time  . Prenatal Vit-Fe Fumarate-FA (PRENATAL MULTIVITAMIN) TABS tablet Take 1 tablet by mouth daily at 12 noon.   12/14/2014 at Unknown time  . ranitidine (ZANTAC) 150 MG tablet Take 1 tablet (150 mg total) by mouth 2 (two) times daily. 60 tablet 1 two weeks  . EPINEPHrine 0.3 mg/0.3 mL IJ SOAJ injection Inject 0.3 mg into the muscle daily as needed (allergic reaction).   rescue    Review of Systems  Constitutional: Negative.   Respiratory: Negative.   Cardiovascular: Negative.   Gastrointestinal: Negative for nausea, vomiting, abdominal pain, diarrhea and constipation.  Genitourinary: Negative for dysuria, urgency, frequency, hematuria and flank pain.       Negative for vaginal bleeding, + cramping/contractions  Musculoskeletal: Negative.   Neurological: Negative.   Psychiatric/Behavioral: Negative.    Physical Exam   Blood pressure 128/71, pulse 119, temperature 97.3 F (36.3 C), resp. rate 24, height 5' (1.524 m), weight 249 lb 9.6 oz (113.218 kg), last menstrual period 04/08/2014, SpO2 97 %.  Physical Exam  Nursing note and vitals reviewed. Constitutional: She is oriented to person, place, and time. She appears well-developed. No distress (uncomfortable/anxious appearing).  Obese   Cardiovascular: Regular rhythm.  Tachycardia present.   Respiratory: Effort normal.  GI: Soft. Distention:  c/w dates. There is no tenderness.  Genitourinary:  Dilation: Closed Cervical Position: Posterior, High Exam by:: Susa Simmonds CNM   Neurological: She is alert and oriented to person, place, and time.  Skin: Skin is warm and dry.  Psychiatric: She has a normal mood and affect.   EFM: 140s, mod variability, + accels TOCO: no UCs tracing, difficult to trace d/t body habitus  MAU Course  Procedures  Patient Vitals for the past 24 hrs:  BP Temp Pulse Resp SpO2 Height Weight  12/14/14 1954 - 97.3 F (36.3 C) - - - - -  12/14/14 1949 128/71 mmHg - 119 - - - -  12/14/14 1930  152/89 mmHg - (!) 133 24 97 % 5' (1.524 m) 249 lb 9.6 oz (113.218 kg)   Initial BP elevated, standing and very uncomfortable/anxious, subsequent WNL Spoke w/ Dr. Nelda Marseille after initial eval, ok to give Nubain 10 mg IM, observe BP and monitor for signs of labor  Care assumed by K. Jaclyn Prime, PA  [facility] 12/14/2014, 8:38 PM   Results for orders placed or performed during the hospital encounter of 12/14/14 (from the past 24 hour(s))  Urinalysis, Routine w reflex microscopic     Status: Abnormal   Collection Time: 12/14/14  7:40 PM  Result Value Ref Range   Color, Urine YELLOW YELLOW   APPearance HAZY (A) CLEAR   Specific Gravity, Urine >1.030 (H) 1.005 - 1.030   pH 6.0 5.0 - 8.0   Glucose, UA NEGATIVE NEGATIVE mg/dL   Hgb urine dipstick TRACE (A) NEGATIVE   Bilirubin Urine NEGATIVE NEGATIVE   Ketones, ur NEGATIVE NEGATIVE mg/dL   Protein, ur NEGATIVE NEGATIVE mg/dL   Urobilinogen, UA 0.2 0.0 - 1.0 mg/dL   Nitrite POSITIVE (A) NEGATIVE   Leukocytes, UA NEGATIVE NEGATIVE  Urine microscopic-add on     Status: Abnormal   Collection Time: 12/14/14  7:40 PM  Result Value Ref Range   Squamous Epithelial / LPF FEW (A) RARE   WBC, UA 0-2 <3 WBC/hpf   RBC / HPF 0-2 <3 RBC/hpf   Bacteria, UA FEW (A) RARE   Urine-Other MUCOUS PRESENT    Cervix rechecked.  Closed.   Pt indicates improvement overall.   BP has been improved also.    Discussed with Dr. Mancel Bale.  Pt is feeling improved.  NO ctx on monitor.  No cervical change.  However tachycardic and urine with nitrites.  Advised to treat with macrobid and send for urine cx.  Okay to discharge with f/u in office next week.   A: Threatened PTL Urinary tract infection in pregnancy  P: Discharge to home Push PO fluids  Macrobid rx F/u in clinic  Await urine cx. Labor precautions Patient may return to MAU as needed or if her condition were to change or worsen

## 2014-12-14 NOTE — Discharge Instructions (Signed)
Asymptomatic Bacteriuria Asymptomatic bacteriuria is the presence of a large number of bacteria in your urine without the usual symptoms of burning or frequent urination. The following conditions increase the risk of asymptomatic bacteriuria:  Diabetes mellitus.  Advanced age.  Pregnancy in the first trimester.  Kidney stones.  Kidney transplants.  Leaky kidney tube valve in young children (reflux). Treatment for this condition is not needed in most people and can lead to other problems such as too much yeast and growth of resistant bacteria. However, some people, such as pregnant women, do need treatment to prevent kidney infection. Asymptomatic bacteriuria in pregnancy is also associated with fetal growth restriction, premature labor, and newborn death. HOME CARE INSTRUCTIONS Monitor your condition for any changes. The following actions may help to relieve any discomfort you are feeling:  Drink enough water and fluids to keep your urine clear or pale yellow. Go to the bathroom more often to keep your bladder empty.  Keep the area around your vagina and rectum clean. Wipe yourself from front to back after urinating. SEEK IMMEDIATE MEDICAL CARE IF:  You develop signs of an infection such as:  Burning with urination.  Frequency of voiding.  Back pain.  Fever.  You have blood in the urine.  You develop a fever. MAKE SURE YOU:  Understand these instructions.  Will watch your condition.  Will get help right away if you are not doing well or get worse. Document Released: 08/16/2005 Document Revised: 12/31/2013 Document Reviewed: 02/05/2013 ExitCare Patient Information 2015 [employer]. This information is not intended to replace advice given to you by your health care provider. Make sure you discuss any questions you have with your health care provider.  

## 2014-12-16 LAB — CULTURE, OB URINE

## 2015-01-02 ENCOUNTER — Inpatient Hospital Stay (HOSPITAL_COMMUNITY)
Admission: AD | Admit: 2015-01-02 | Discharge: 2015-01-03 | Disposition: A | Discharge status: Home / Self Care | Payer: Medicaid Other | Source: Ambulatory Visit | Attending: Obstetrics & Gynecology | Admitting: Obstetrics & Gynecology

## 2015-01-02 ENCOUNTER — Encounter (HOSPITAL_COMMUNITY): Payer: Self-pay | Admitting: *Deleted

## 2015-01-02 DIAGNOSIS — O99820 Streptococcus B carrier state complicating pregnancy: Secondary | ICD-10-CM | POA: Insufficient documentation

## 2015-01-02 DIAGNOSIS — Z3A38 38 weeks gestation of pregnancy: Secondary | ICD-10-CM | POA: Insufficient documentation

## 2015-01-02 NOTE — MAU Note (Signed)
PT  SAYS SHE STARTED  HURTING  BAD  AT 7 PM.  VE IN MAU 1 MTH AGO  -   CLOSED.  DENIES HSV AND MRSA.  GBS-  POSITIVE    [facility]

## 2015-01-03 DIAGNOSIS — Z3A38 38 weeks gestation of pregnancy: Secondary | ICD-10-CM | POA: Diagnosis not present

## 2015-01-03 DIAGNOSIS — O99820 Streptococcus B carrier state complicating pregnancy: Secondary | ICD-10-CM | POA: Diagnosis not present

## 2015-01-07 NOTE — H&P (Signed)
HPI: 29 y/o G2P1001 @ [redacted]w[redacted]d estimated gestational age (as dated by 10 week ultrasound) presents for scheduled repeat C-section.   no Leaking of Fluid,   no Vaginal Bleeding,   no Uterine Contractions,  + Fetal Movement.  ROS: no HA, no epigastric pain, no visual changes.    Pregnancy complicated by: 1) Obesity- BMI: 48 Size greater than dates: Last [place] @ [redacted]w[redacted]d- vertex/anterior/EFW: 5#8oz (50%) 2) Asthma- albuterol prn, has not required inhaler in several months 3) Depression/Anxiety- on zoloft 50mg  daily, mood appropriate, no SI/HI 4) Prior C-section for failure to progress, desires repeat    Prenatal Transfer Tool  Maternal Diabetes: No Genetic Screening: Normal Maternal Ultrasounds/Referrals: Normal Fetal Ultrasounds or other Referrals:  None Maternal Substance Abuse:  No Significant Maternal Medications:  Meds include: Zantac Zoloft Significant Maternal Lab Results: Lab values include: Group B Strep negative   PNL:  GBS negative, Rub Immune, Hep B neg, RPR NR, HIV neg, GC/C neg, glucola:135 Hgb 12 (10/24/14) Blood type: A positive, antibody negative  Immunizations: Tdap given (10/02/14), Flu given (07/12/14)  OBHx: January 2012- Female, primary C-section due to failure to progress and c/b endometritis, 7#3oz PMHx:  1) Asthma 2) Obesity 3) Depression/Anxiety  Meds:  PNV, Zoloft Allergy:   Allergies  Allergen Reactions  . Shellfish Allergy Anaphylaxis, Hives and Swelling    Pt throat swells  . Asa Buff (Mag [Buffered Aspirin] Swelling  . Iodine Hives and Swelling   SurgHx: primary C-section SocHx:   no Tobacco, no  EtOH, no Illicit Drugs  O: LMP 93/57/0177 (Examination performed in office 4/28) Gen. AAOx3, NAD CV.  RRR   Resp. CTAB, no wheeze or crackles. Abd. Gravid,  no tenderness Extr.  1+ non-pitting edema B/L , no calf tenderness bilaterally FHT: 140 by doppler  Labs: see orders  A/P:  29 y.o. G2P1001 @ [redacted]w[redacted]d EGA who presents for repeat C-section -FWB:   Reassuring by Doppler -NPO -Ancef 3g IV to OR -SCDs to OR -IVF: LR @ 125cc/hr Risk benefits and alternatives of cesarean section were discussed with the patient including but not limited to infection, bleeding, damage to bowel , bladder and baby with the need for further surgery. Pt voiced understanding and desires to proceed.   Janyth Pupa, DO [phone] (pager) [phone] (office)

## 2015-01-08 ENCOUNTER — Encounter (HOSPITAL_COMMUNITY): Payer: Self-pay | Admitting: Obstetrics & Gynecology

## 2015-01-09 ENCOUNTER — Encounter (HOSPITAL_COMMUNITY): Payer: Self-pay | Admitting: *Deleted

## 2015-01-09 NOTE — Patient Instructions (Signed)
   Your procedure is scheduled on:  Monday, May 16  Enter through the Main Entrance of [facility] at: 6 AM Pick up the phone at the desk and dial [phone] and inform us of your arrival.  Please call this number if you have any problems the morning of surgery: [phone]  Remember: Do not eat or drink after midnight: Sunday Take these medicines the morning of surgery with a SIP OF WATER:  Zantac.  Bring albuterol inhaler with you on day of surgery.  Do not wear jewelry, make-up, or FINGER nail polish No metal in your hair or on your body. Do not wear lotions, powders, perfumes.  You may wear deodorant.  Do not bring valuables to the hospital. Contacts, dentures or bridgework may not be worn into surgery.  Leave suitcase in the car. After Surgery it may be brought to your room. For patients being admitted to the hospital, checkout time is 11:00am the day of discharge.

## 2015-01-10 ENCOUNTER — Encounter (HOSPITAL_COMMUNITY): Payer: Self-pay

## 2015-01-10 ENCOUNTER — Encounter (HOSPITAL_COMMUNITY)
Admission: RE | Admit: 2015-01-10 | Discharge: 2015-01-10 | Disposition: A | Discharge status: Home / Self Care | Payer: Medicaid Other | Source: Ambulatory Visit | Attending: Obstetrics & Gynecology | Admitting: Obstetrics & Gynecology

## 2015-01-10 DIAGNOSIS — Z01818 Encounter for other preprocedural examination: Secondary | ICD-10-CM | POA: Diagnosis not present

## 2015-01-10 LAB — CBC
HCT: 35.2 % — ABNORMAL LOW (ref 36.0–46.0)
HEMOGLOBIN: 11.8 g/dL — AB (ref 12.0–15.0)
MCH: 30.6 pg (ref 26.0–34.0)
MCHC: 33.5 g/dL (ref 30.0–36.0)
MCV: 91.4 fL (ref 78.0–100.0)
Platelets: 338 10*3/uL (ref 150–400)
RBC: 3.85 MIL/uL — ABNORMAL LOW (ref 3.87–5.11)
RDW: 14.7 % (ref 11.5–15.5)
WBC: 7.8 10*3/uL (ref 4.0–10.5)

## 2015-01-10 LAB — ABO/RH: ABO/RH(D): A POS

## 2015-01-10 NOTE — Patient Instructions (Signed)
   Your procedure is scheduled on: MAY 16 AT 730AM  Enter through the Main Entrance of [facility] at: 6AM  Pick up the phone at the desk and dial [phone] and inform us of your arrival.  Please call this number if you have any problems the morning of surgery: [phone]  Remember: DO NOT EAT OR DRINK AFTER MIDNGHT Take these medicines the morning of surgery with a SIP OF WATER:  Do not wear jewelry, make-up, or FINGER nail polish No metal in your hair or on your body. Do not wear lotions, powders, perfumes.  You may wear deodorant.  Do not bring valuables to the hospital. Contacts, dentures or bridgework may not be worn into surgery.  Leave suitcase in the car. After Surgery it may be brought to your room. For patients being admitted to the hospital, checkout time is 11:00am the day of discharge.

## 2015-01-11 LAB — RPR: RPR: NONREACTIVE

## 2015-01-12 MED ORDER — CEFAZOLIN SODIUM-DEXTROSE 2-3 GM-% IV SOLR
2.0000 g | INTRAVENOUS | Status: AC
  Administered 2015-01-13: 2 g via INTRAVENOUS

## 2015-01-13 ENCOUNTER — Encounter (HOSPITAL_COMMUNITY): Payer: Self-pay | Admitting: *Deleted

## 2015-01-13 ENCOUNTER — Inpatient Hospital Stay (HOSPITAL_COMMUNITY): Payer: Medicaid Other | Admitting: Anesthesiology

## 2015-01-13 ENCOUNTER — Inpatient Hospital Stay (HOSPITAL_COMMUNITY)
Admission: RE | Admit: 2015-01-13 | Discharge: 2015-01-15 | DRG: 765 | Disposition: A | Discharge status: Home / Self Care | Payer: Medicaid Other | Source: Ambulatory Visit | Attending: Obstetrics & Gynecology | Admitting: Obstetrics & Gynecology

## 2015-01-13 ENCOUNTER — Encounter (HOSPITAL_COMMUNITY)
Admission: RE | Disposition: A | Discharge status: Home / Self Care | Payer: Self-pay | Source: Ambulatory Visit | Attending: Obstetrics & Gynecology

## 2015-01-13 DIAGNOSIS — O99214 Obesity complicating childbirth: Secondary | ICD-10-CM | POA: Diagnosis present

## 2015-01-13 DIAGNOSIS — Z98891 History of uterine scar from previous surgery: Secondary | ICD-10-CM

## 2015-01-13 DIAGNOSIS — N858 Other specified noninflammatory disorders of uterus: Secondary | ICD-10-CM | POA: Diagnosis present

## 2015-01-13 DIAGNOSIS — O9952 Diseases of the respiratory system complicating childbirth: Secondary | ICD-10-CM | POA: Diagnosis present

## 2015-01-13 DIAGNOSIS — O99344 Other mental disorders complicating childbirth: Secondary | ICD-10-CM | POA: Diagnosis present

## 2015-01-13 DIAGNOSIS — Z91048 Other nonmedicinal substance allergy status: Secondary | ICD-10-CM | POA: Diagnosis not present

## 2015-01-13 DIAGNOSIS — Z91013 Allergy to seafood: Secondary | ICD-10-CM | POA: Diagnosis not present

## 2015-01-13 DIAGNOSIS — O3421 Maternal care for scar from previous cesarean delivery: Principal | ICD-10-CM | POA: Diagnosis present

## 2015-01-13 DIAGNOSIS — J45909 Unspecified asthma, uncomplicated: Secondary | ICD-10-CM | POA: Diagnosis present

## 2015-01-13 DIAGNOSIS — Z3A39 39 weeks gestation of pregnancy: Secondary | ICD-10-CM | POA: Diagnosis present

## 2015-01-13 DIAGNOSIS — F329 Major depressive disorder, single episode, unspecified: Secondary | ICD-10-CM | POA: Diagnosis present

## 2015-01-13 DIAGNOSIS — Z888 Allergy status to other drugs, medicaments and biological substances status: Secondary | ICD-10-CM | POA: Diagnosis not present

## 2015-01-13 DIAGNOSIS — Z6841 Body Mass Index (BMI) 40.0 and over, adult: Secondary | ICD-10-CM | POA: Diagnosis not present

## 2015-01-13 DIAGNOSIS — F419 Anxiety disorder, unspecified: Secondary | ICD-10-CM | POA: Diagnosis present

## 2015-01-13 LAB — PREPARE RBC (CROSSMATCH)

## 2015-01-13 SURGERY — Surgical Case
Anesthesia: Spinal

## 2015-01-13 MED ORDER — MORPHINE SULFATE 0.5 MG/ML IJ SOLN
INTRAMUSCULAR | Status: AC
  Filled 2015-01-13: qty 10

## 2015-01-13 MED ORDER — NALBUPHINE HCL 10 MG/ML IJ SOLN
5.0000 mg | INTRAMUSCULAR | Status: DC | PRN
  Administered 2015-01-13: 5 mg via SUBCUTANEOUS

## 2015-01-13 MED ORDER — NALOXONE HCL 0.4 MG/ML IJ SOLN: 0.4000 mg | INTRAMUSCULAR | Status: DC | PRN

## 2015-01-13 MED ORDER — SCOPOLAMINE 1 MG/3DAYS TD PT72
1.0000 | MEDICATED_PATCH | Freq: Once | TRANSDERMAL | Status: DC
Start: 2015-01-13 — End: 2015-01-15
  Filled 2015-01-13: qty 1

## 2015-01-13 MED ORDER — SIMETHICONE 80 MG PO CHEW: 80.0000 mg | CHEWABLE_TABLET | ORAL | Status: DC | PRN

## 2015-01-13 MED ORDER — IBUPROFEN 800 MG PO TABS
800.0000 mg | ORAL_TABLET | Freq: Three times a day (TID) | ORAL | Status: DC | PRN
  Administered 2015-01-13 – 2015-01-15 (×6): 800 mg via ORAL
  Filled 2015-01-13 (×6): qty 1

## 2015-01-13 MED ORDER — 0.9 % SODIUM CHLORIDE (POUR BTL) OPTIME
TOPICAL | Status: DC | PRN
  Administered 2015-01-13 (×2): 1000 mL

## 2015-01-13 MED ORDER — OXYTOCIN 10 UNIT/ML IJ SOLN
INTRAMUSCULAR | Status: AC
  Filled 2015-01-13: qty 4

## 2015-01-13 MED ORDER — OXYTOCIN 10 UNIT/ML IJ SOLN
40.0000 [IU] | INTRAVENOUS | Status: DC | PRN
  Administered 2015-01-13: 40 [IU] via INTRAVENOUS

## 2015-01-13 MED ORDER — SENNOSIDES-DOCUSATE SODIUM 8.6-50 MG PO TABS
2.0000 | ORAL_TABLET | ORAL | Status: DC
  Administered 2015-01-14 (×2): 2 via ORAL
  Filled 2015-01-13 (×2): qty 2

## 2015-01-13 MED ORDER — TETANUS-DIPHTH-ACELL PERTUSSIS 5-2.5-18.5 LF-MCG/0.5 IM SUSP: 0.5000 mL | Freq: Once | INTRAMUSCULAR | Status: DC

## 2015-01-13 MED ORDER — NALBUPHINE HCL 10 MG/ML IJ SOLN
5.0000 mg | Freq: Once | INTRAMUSCULAR | Status: AC | PRN
  Administered 2015-01-13: 5 mg via SUBCUTANEOUS

## 2015-01-13 MED ORDER — PHENYLEPHRINE 8 MG IN D5W 100 ML (0.08MG/ML) PREMIX OPTIME
INJECTION | INTRAVENOUS | Status: DC | PRN
  Administered 2015-01-13: 45 ug/min via INTRAVENOUS

## 2015-01-13 MED ORDER — FENTANYL CITRATE (PF) 100 MCG/2ML IJ SOLN
25.0000 ug | INTRAMUSCULAR | Status: DC | PRN
  Administered 2015-01-13: 50 ug via INTRAVENOUS

## 2015-01-13 MED ORDER — OXYCODONE-ACETAMINOPHEN 5-325 MG PO TABS
2.0000 | ORAL_TABLET | ORAL | Status: DC | PRN
  Administered 2015-01-14 – 2015-01-15 (×3): 2 via ORAL
  Filled 2015-01-13 (×2): qty 2

## 2015-01-13 MED ORDER — DIPHENHYDRAMINE HCL 25 MG PO CAPS: 25.0000 mg | ORAL_CAPSULE | Freq: Four times a day (QID) | ORAL | Status: DC | PRN

## 2015-01-13 MED ORDER — MORPHINE SULFATE (PF) 0.5 MG/ML IJ SOLN
INTRAMUSCULAR | Status: DC | PRN
  Administered 2015-01-13: .1 mg via INTRATHECAL

## 2015-01-13 MED ORDER — ALBUTEROL SULFATE HFA 108 (90 BASE) MCG/ACT IN AERS: 2.0000 | INHALATION_SPRAY | Freq: Four times a day (QID) | RESPIRATORY_TRACT | Status: DC | PRN

## 2015-01-13 MED ORDER — WITCH HAZEL-GLYCERIN EX PADS: 1.0000 "application " | MEDICATED_PAD | CUTANEOUS | Status: DC | PRN

## 2015-01-13 MED ORDER — FENTANYL CITRATE (PF) 100 MCG/2ML IJ SOLN
INTRAMUSCULAR | Status: AC
  Filled 2015-01-13: qty 2

## 2015-01-13 MED ORDER — SIMETHICONE 80 MG PO CHEW
80.0000 mg | CHEWABLE_TABLET | ORAL | Status: DC
  Administered 2015-01-14 (×2): 80 mg via ORAL
  Filled 2015-01-13 (×2): qty 1

## 2015-01-13 MED ORDER — PHENYLEPHRINE 8 MG IN D5W 100 ML (0.08MG/ML) PREMIX OPTIME
INJECTION | INTRAVENOUS | Status: AC
  Filled 2015-01-13: qty 100

## 2015-01-13 MED ORDER — SCOPOLAMINE 1 MG/3DAYS TD PT72
MEDICATED_PATCH | TRANSDERMAL | Status: AC
  Filled 2015-01-13: qty 1

## 2015-01-13 MED ORDER — LACTATED RINGERS IV SOLN
INTRAVENOUS | Status: DC
  Administered 2015-01-13: 21:00:00 via INTRAVENOUS
  Administered 2015-01-14: 1 mL via INTRAVENOUS

## 2015-01-13 MED ORDER — KETOROLAC TROMETHAMINE 30 MG/ML IJ SOLN: 30.0000 mg | Freq: Once | INTRAMUSCULAR | Status: DC

## 2015-01-13 MED ORDER — ONDANSETRON HCL 4 MG/2ML IJ SOLN
INTRAMUSCULAR | Status: AC
  Filled 2015-01-13: qty 2

## 2015-01-13 MED ORDER — LACTATED RINGERS IV SOLN
INTRAVENOUS | Status: DC
  Administered 2015-01-13 (×2): via INTRAVENOUS

## 2015-01-13 MED ORDER — ONDANSETRON HCL 4 MG/2ML IJ SOLN
INTRAMUSCULAR | Status: DC | PRN
  Administered 2015-01-13: 4 mg via INTRAVENOUS

## 2015-01-13 MED ORDER — NALBUPHINE HCL 10 MG/ML IJ SOLN: 5.0000 mg | INTRAMUSCULAR | Status: DC | PRN

## 2015-01-13 MED ORDER — MEPERIDINE HCL 25 MG/ML IJ SOLN
INTRAMUSCULAR | Status: AC
  Filled 2015-01-13: qty 1

## 2015-01-13 MED ORDER — FENTANYL CITRATE (PF) 100 MCG/2ML IJ SOLN
INTRAMUSCULAR | Status: DC | PRN
  Administered 2015-01-13: 25 ug via INTRATHECAL

## 2015-01-13 MED ORDER — SCOPOLAMINE 1 MG/3DAYS TD PT72
1.0000 | MEDICATED_PATCH | Freq: Once | TRANSDERMAL | Status: DC
  Administered 2015-01-13: 1.5 mg via TRANSDERMAL

## 2015-01-13 MED ORDER — OXYCODONE-ACETAMINOPHEN 5-325 MG PO TABS
1.0000 | ORAL_TABLET | ORAL | Status: DC | PRN
Start: 2015-01-14 — End: 2015-01-15
  Administered 2015-01-14 – 2015-01-15 (×5): 1 via ORAL
  Filled 2015-01-13 (×7): qty 1

## 2015-01-13 MED ORDER — DIPHENHYDRAMINE HCL 50 MG/ML IJ SOLN: 12.5000 mg | INTRAMUSCULAR | Status: DC | PRN

## 2015-01-13 MED ORDER — DEXTROSE 5 % IV SOLN
1.0000 ug/kg/h | INTRAVENOUS | Status: DC | PRN
  Filled 2015-01-13: qty 2

## 2015-01-13 MED ORDER — ALBUTEROL SULFATE (2.5 MG/3ML) 0.083% IN NEBU: 2.5000 mg | INHALATION_SOLUTION | Freq: Four times a day (QID) | RESPIRATORY_TRACT | Status: DC | PRN

## 2015-01-13 MED ORDER — ZOLPIDEM TARTRATE 5 MG PO TABS: 5.0000 mg | ORAL_TABLET | Freq: Every evening | ORAL | Status: DC | PRN

## 2015-01-13 MED ORDER — LANOLIN HYDROUS EX OINT: 1.0000 "application " | TOPICAL_OINTMENT | CUTANEOUS | Status: DC | PRN

## 2015-01-13 MED ORDER — NALBUPHINE HCL 10 MG/ML IJ SOLN
INTRAMUSCULAR | Status: AC
  Filled 2015-01-13: qty 1

## 2015-01-13 MED ORDER — MENTHOL 3 MG MT LOZG: 1.0000 | LOZENGE | OROMUCOSAL | Status: DC | PRN

## 2015-01-13 MED ORDER — LACTATED RINGERS IV SOLN
INTRAVENOUS | Status: DC
  Administered 2015-01-13: 08:00:00 via INTRAVENOUS

## 2015-01-13 MED ORDER — NALBUPHINE HCL 10 MG/ML IJ SOLN: 5.0000 mg | Freq: Once | INTRAMUSCULAR | Status: AC | PRN

## 2015-01-13 MED ORDER — DIPHENHYDRAMINE HCL 25 MG PO CAPS
25.0000 mg | ORAL_CAPSULE | ORAL | Status: DC | PRN
  Administered 2015-01-15: 25 mg via ORAL
  Filled 2015-01-13: qty 1

## 2015-01-13 MED ORDER — SODIUM CHLORIDE 0.9 % IJ SOLN: 3.0000 mL | INTRAMUSCULAR | Status: DC | PRN

## 2015-01-13 MED ORDER — SIMETHICONE 80 MG PO CHEW
80.0000 mg | CHEWABLE_TABLET | Freq: Three times a day (TID) | ORAL | Status: DC
  Administered 2015-01-13 – 2015-01-15 (×5): 80 mg via ORAL
  Filled 2015-01-13 (×4): qty 1

## 2015-01-13 MED ORDER — MEPERIDINE HCL 25 MG/ML IJ SOLN: 6.2500 mg | INTRAMUSCULAR | Status: DC | PRN

## 2015-01-13 MED ORDER — OXYTOCIN 40 UNITS IN LACTATED RINGERS INFUSION - SIMPLE MED: 62.5000 mL/h | INTRAVENOUS | Status: AC

## 2015-01-13 MED ORDER — CEFAZOLIN SODIUM-DEXTROSE 2-3 GM-% IV SOLR
INTRAVENOUS | Status: AC
  Filled 2015-01-13: qty 50

## 2015-01-13 MED ORDER — ACETAMINOPHEN 325 MG PO TABS: 650.0000 mg | ORAL_TABLET | ORAL | Status: DC | PRN

## 2015-01-13 MED ORDER — PRENATAL MULTIVITAMIN CH
1.0000 | ORAL_TABLET | Freq: Every day | ORAL | Status: DC
  Administered 2015-01-14: 1 via ORAL
  Filled 2015-01-13 (×2): qty 1

## 2015-01-13 MED ORDER — ONDANSETRON HCL 4 MG/2ML IJ SOLN: 4.0000 mg | Freq: Three times a day (TID) | INTRAMUSCULAR | Status: DC | PRN

## 2015-01-13 MED ORDER — ACETAMINOPHEN 500 MG PO TABS
1000.0000 mg | ORAL_TABLET | Freq: Four times a day (QID) | ORAL | Status: AC
  Administered 2015-01-13: 1000 mg via ORAL
  Filled 2015-01-13: qty 2

## 2015-01-13 MED ORDER — MEPERIDINE HCL 25 MG/ML IJ SOLN
INTRAMUSCULAR | Status: DC | PRN
  Administered 2015-01-13 (×2): 12.5 mg via INTRAVENOUS

## 2015-01-13 MED ORDER — SERTRALINE HCL 50 MG PO TABS
50.0000 mg | ORAL_TABLET | Freq: Every day | ORAL | Status: DC
  Administered 2015-01-15: 50 mg via ORAL
  Filled 2015-01-13 (×4): qty 1

## 2015-01-13 MED ORDER — DIBUCAINE 1 % RE OINT: 1.0000 "application " | TOPICAL_OINTMENT | RECTAL | Status: DC | PRN

## 2015-01-13 SURGICAL SUPPLY — 36 items
BENZOIN TINCTURE PRP APPL 2/3 (GAUZE/BANDAGES/DRESSINGS) ×2 IMPLANT
CLAMP CORD UMBIL (MISCELLANEOUS) IMPLANT
CLOTH BEACON ORANGE TIMEOUT ST (SAFETY) ×2 IMPLANT
DRAPE SHEET LG 3/4 BI-LAMINATE (DRAPES) IMPLANT
DRESSING DISP NPWT PICO 4X12 (MISCELLANEOUS) ×2 IMPLANT
DRSG OPSITE POSTOP 4X10 (GAUZE/BANDAGES/DRESSINGS) ×2 IMPLANT
DURAPREP 26ML APPLICATOR (WOUND CARE) ×2 IMPLANT
ELECT REM PT RETURN 9FT ADLT (ELECTROSURGICAL) ×2
ELECTRODE REM PT RTRN 9FT ADLT (ELECTROSURGICAL) ×1 IMPLANT
EXTRACTOR VACUUM KIWI (MISCELLANEOUS) IMPLANT
GLOVE BIOGEL PI IND STRL 6.5 (GLOVE) ×1 IMPLANT
GLOVE BIOGEL PI INDICATOR 6.5 (GLOVE) ×1
GLOVE ECLIPSE 6.5 STRL STRAW (GLOVE) ×2 IMPLANT
GOWN STRL REUS W/TWL LRG LVL3 (GOWN DISPOSABLE) ×4 IMPLANT
HEMOSTAT SURGICEL 2X3 (HEMOSTASIS) ×2 IMPLANT
KIT ABG SYR 3ML LUER SLIP (SYRINGE) IMPLANT
LIQUID BAND (GAUZE/BANDAGES/DRESSINGS) IMPLANT
NEEDLE HYPO 25X5/8 SAFETYGLIDE (NEEDLE) IMPLANT
NS IRRIG 1000ML POUR BTL (IV SOLUTION) ×2 IMPLANT
PACK C SECTION WH (CUSTOM PROCEDURE TRAY) ×2 IMPLANT
PAD ABD 7.5X8 STRL (GAUZE/BANDAGES/DRESSINGS) ×2 IMPLANT
PAD OB MATERNITY 4.3X12.25 (PERSONAL CARE ITEMS) ×2 IMPLANT
RETAINER VISCERAL (MISCELLANEOUS) ×2 IMPLANT
RTRCTR C-SECT PINK 25CM LRG (MISCELLANEOUS) ×2 IMPLANT
STRIP CLOSURE SKIN 1/2X4 (GAUZE/BANDAGES/DRESSINGS) ×2 IMPLANT
SUT PLAIN 0 NONE (SUTURE) IMPLANT
SUT PLAIN 2 0 XLH (SUTURE) IMPLANT
SUT VIC AB 0 CT1 27 (SUTURE) ×2
SUT VIC AB 0 CT1 27XBRD ANBCTR (SUTURE) ×2 IMPLANT
SUT VIC AB 0 CTX 36 (SUTURE) ×3
SUT VIC AB 0 CTX36XBRD ANBCTRL (SUTURE) ×3 IMPLANT
SUT VIC AB 2-0 CT1 27 (SUTURE) ×1
SUT VIC AB 2-0 CT1 TAPERPNT 27 (SUTURE) ×1 IMPLANT
SUT VIC AB 4-0 KS 27 (SUTURE) ×2 IMPLANT
TOWEL OR 17X24 6PK STRL BLUE (TOWEL DISPOSABLE) ×2 IMPLANT
TRAY FOLEY CATH SILVER 14FR (SET/KITS/TRAYS/PACK) IMPLANT

## 2015-01-13 NOTE — Op Note (Addendum)
PreOp Diagnosis: 1) Intrauterine pregnancy @ [redacted]w[redacted]d 2) Prior C-section, desires repeat 3) Asthma 4) Obesity 5) Depression PostOp Diagnosis: same Procedure: Repeat LTCS Surgeon: Dr. Janyth Pupa Assistant: Dr. Cyndia Skeeters Anesthesia: spinal Complications: none EBL: 700cc UOP: 200cc Fluids: 1800cc  Findings: Female infant from cephalic presentation.  Normal uterus, tubes and ovaries.  PROCEDURE:  Informed consent was obtained from the patient with risks, benefits, complications, treatment options, and expected outcomes discussed with the patient.  The patient concurred with the proposed plan, giving informed consent with form signed.   The patient was taken to Operating Room, and identified with the procedure verified as C-Section Delivery with Time Out. With induction of anesthesia, the patient was prepped and draped in the usual sterile fashion. A Pfannenstiel incision was made and carried down through the subcutaneous tissue to the fascia. The fascia was incised in the midline and extended transversely. The superior aspect of the fascial incision was grasped with Kochers elevated and the underlying muscle dissected off. The inferior aspect of the facial incision was in similar fashion, grasped elevated and rectus muscles dissected off. The peritoneum was identified and entered. Peritoneal incision was extended longitudinally. Minimal adhesions were noted between the uterus and pelvic side wall were noted and taken down with the bovie.  The Alexis retractor was inserted.  The utero-vesical peritoneal reflection was identified and incised transversely with the [facility] scissors, the incision extended laterally, the bladder flap created digitally. A low transverse uterine incision was made and the infants head delivered atraumatically. After the umbilical cord was clamped and cut, the infant was handed off to awaiting pediatricians.    The placenta was removed intact and appeared normal. The uterine  outline, tubes and ovaries appeared normal. The uterine incision was closed with running locked sutures of 0 Vicryl and a second layer of the same stitch was used in an imbricating fashion.  Excellent hemostasis was obtained.  The pericolic gutters were then cleared of all clots and debris. Surgicel was placed over the hysterotomy.  The peritoneum was closed in a running fashion using 2-0 vicryl. The fascia was then reapproximated with running sutures of 0 Vicryl. The subcutaneous tissue was reapproximated with 2-0 plain gut suture.  The skin was closed with 4-0 vicryl in a subcuticular fashion.  Dr. Simona Huh was present to assist in the case due to the complexity of this case including her prior C-section and morbid obesity.    Instrument, sponge, and needle counts were correct prior the abdominal closure and at the conclusion of the case. The patient was taken to recovery in stable condition.  Janyth Pupa, DO [phone] (pager) [phone] (office)

## 2015-01-13 NOTE — Anesthesia Postprocedure Evaluation (Signed)
  Anesthesia Post-op Note  Patient: Kendra [name]  Procedure(s) Performed: Procedure(s) with comments: CESAREAN SECTION (N/A) - EDD 01/18/15 would like [place] dressing  Patient Location: PACU  Anesthesia Type:Spinal  Level of Consciousness: awake  Airway and Oxygen Therapy: Patient Spontanous Breathing  Post-op Pain: mild  Post-op Assessment: Post-op Vital signs reviewed, Patient's Cardiovascular Status Stable, Respiratory Function Stable, Patent Airway, No signs of Nausea or vomiting and Pain level controlled  Post-op Vital Signs: Reviewed and stable  Last Vitals:  Filed Vitals:   01/13/15 1000  BP: 110/66  Pulse: 99  Temp:   Resp: 27    Complications: No apparent anesthesia complications

## 2015-01-13 NOTE — Anesthesia Postprocedure Evaluation (Signed)
Anesthesia Post Note  Patient: Kendra [name]  Procedure(s) Performed: Procedure(s) (LRB): CESAREAN SECTION (N/A)  Anesthesia type: Spinal  Patient location: Mother/Baby  Post pain: Pain level controlled  Post assessment: Post-op Vital signs reviewed  Last Vitals:  Filed Vitals:   01/13/15 1230  BP: 122/66  Pulse: 99  Temp: 36.8 C  Resp: 20    Post vital signs: Reviewed  Level of consciousness: awake  Complications: No apparent anesthesia complications

## 2015-01-13 NOTE — Interval H&P Note (Signed)
History and Physical Interval Note:  01/13/2015 6:58 AM  Kendra [name]  has presented today for surgery, with the diagnosis of Z98.89 History of Cesarean Section  The various methods of treatment have been discussed with the patient and family. After consideration of risks, benefits and other options for treatment, the patient has consented to  Procedure(s) with comments: CESAREAN SECTION (N/A) - EDD 01/18/15 would like [place] dressing as a surgical intervention .  The patient's history has been reviewed, patient examined, no change in status, stable for surgery.  I have reviewed the patient's chart and labs.  Questions were answered to the patient's satisfaction.     Janyth Pupa, M

## 2015-01-13 NOTE — Transfer of Care (Signed)
Immediate Anesthesia Transfer of Care Note  Patient: Kendra [name]  Procedure(s) Performed: Procedure(s) with comments: CESAREAN SECTION (N/A) - EDD 01/18/15 would like [place] dressing  Patient Location: PACU  Anesthesia Type:Spinal  Level of Consciousness: awake  Airway & Oxygen Therapy: Patient Spontanous Breathing  Post-op Assessment: Report given to RN and Post -op Vital signs reviewed and stable  Post vital signs: stable  Last Vitals:  Filed Vitals:   01/13/15 0621  BP: 138/68  Pulse: 108  Temp: 36.8 C  Resp: 16    Complications: No apparent anesthesia complications

## 2015-01-13 NOTE — Lactation Note (Signed)
This note was copied from the chart of [place]. Lactation Consultation Note  Patient Name: Kendra [name] [id number] Date: 01/13/2015 Reason for consult: Initial assessment;Breast surgery Baby 7 hours of life. A family member asked for assistance with feeding. Mom states that she would like formula to feed baby. Mom came in planning to nurse and bottle-feed formula. Mom pumped and bottle-fed first baby because first child never would latch. Mom has had breast reduction and states that nipples were removed without discussion with no discussion with surgeon about ability to nurse after surgery. Baby latched himself to mom's right breast in cross-cradle position, suckling rhythmically with a few swallows noted. Attempted to assess mom's left nipple, but mom did not want to move her arm for fear baby would lose latch. Set DEBP up in room and discussed need to nurse first, and then post-pump. Mom states that she is familiar with DEBP use. Enc mom to call for assistance with hand expressing to see if mom has colostrum. Enc mom to offer lots of STS and nurse with cues. Enc mom to see if baby satisfied after being at breast. Discussed assessment and interventions with patient's RN, Redmond Pulling.   Maternal Data Has patient been taught Hand Expression?: No (Baby latched and mom could not move arm for assistance with hand expression.) Does the patient have breastfeeding experience prior to this delivery?: No (Mom pumping and bottle-fed EBM to first baby.)  Feeding Feeding Type: Breast Fed Length of feed: 30 min  LATCH Score/Interventions                      Lactation Tools Discussed/Used Pump Review: Setup, frequency, and cleaning Initiated by:: JW Date initiated:: 01/13/15   Consult Status Consult Status: Follow-up Date: 01/14/15 Follow-up type: In-patient    Inocente Salles 01/13/2015, 3:07 PM

## 2015-01-13 NOTE — Anesthesia Procedure Notes (Signed)
Spinal Patient location during procedure: OB Preanesthetic Checklist Completed: patient identified, site marked, surgical consent, pre-op evaluation, timeout performed, IV checked, risks and benefits discussed and monitors and equipment checked Spinal Block Patient position: sitting Prep: DuraPrep Patient monitoring: heart rate, cardiac monitor, continuous pulse ox and blood pressure Approach: midline Location: L3-4 Injection technique: single-shot Needle Needle type: Sprotte  Needle gauge: 24 G Needle length: 9 cm Assessment Sensory level: T4 Additional Notes Spinal Dosage in OR  Bupivicaine ml       1.2 PFMS04   mcg        100 Fentanyl mcg            25

## 2015-01-13 NOTE — Anesthesia Preprocedure Evaluation (Signed)
Anesthesia Evaluation  Patient identified by MRN, date of birth, ID band Patient awake    Reviewed: Allergy & Precautions, H&P , Patient's Chart, lab work & pertinent test results  Airway Mallampati: II  TM Distance: >3 FB Neck ROM: full    Dental no notable dental hx.    Pulmonary asthma ,  breath sounds clear to auscultation  Pulmonary exam normal       Cardiovascular Exercise Tolerance: Good Rhythm:regular Rate:Normal     Neuro/Psych    GI/Hepatic   Endo/Other  Morbid obesity  Renal/GU      Musculoskeletal   Abdominal   Peds  Hematology   Anesthesia Other Findings   Reproductive/Obstetrics                             Anesthesia Physical Anesthesia Plan  ASA: III  Anesthesia Plan: Spinal   Post-op Pain Management:    Induction:   Airway Management Planned:   Additional Equipment:   Intra-op Plan:   Post-operative Plan:   Informed Consent: I have reviewed the patients History and Physical, chart, labs and discussed the procedure including the risks, benefits and alternatives for the proposed anesthesia with the patient or authorized representative who has indicated his/her understanding and acceptance.   Dental Advisory Given  Plan Discussed with: CRNA  Anesthesia Plan Comments: (Lab work confirmed with CRNA in room. Platelets okay. Discussed spinal anesthetic, and patient consents to the procedure:  included risk of possible headache,backache, failed block, allergic reaction, and nerve injury. This patient was asked if she had any questions or concerns before the procedure started. )        Anesthesia Quick Evaluation

## 2015-01-14 ENCOUNTER — Encounter (HOSPITAL_COMMUNITY): Payer: Self-pay | Admitting: Obstetrics & Gynecology

## 2015-01-14 DIAGNOSIS — Z98891 History of uterine scar from previous surgery: Secondary | ICD-10-CM

## 2015-01-14 LAB — TYPE AND SCREEN
ABO/RH(D): A POS
Antibody Screen: NEGATIVE
UNIT DIVISION: 0
UNIT DIVISION: 0

## 2015-01-14 LAB — CBC
HEMATOCRIT: 34.1 % — AB (ref 36.0–46.0)
Hemoglobin: 11.3 g/dL — ABNORMAL LOW (ref 12.0–15.0)
MCH: 30.3 pg (ref 26.0–34.0)
MCHC: 33.1 g/dL (ref 30.0–36.0)
MCV: 91.4 fL (ref 78.0–100.0)
Platelets: 326 10*3/uL (ref 150–400)
RBC: 3.73 MIL/uL — AB (ref 3.87–5.11)
RDW: 14.8 % (ref 11.5–15.5)
WBC: 8.2 10*3/uL (ref 4.0–10.5)

## 2015-01-14 LAB — BIRTH TISSUE RECOVERY COLLECTION (PLACENTA DONATION)

## 2015-01-14 LAB — CCBB MATERNAL DONOR DRAW

## 2015-01-14 NOTE — Progress Notes (Signed)
Subjective: Postop Day 1: Cesarean Delivery No complaints.  Pain controlled.  Lochia normal.  Breast feeding yes.  Pt wants to pump to see if she is making anything.  H/o breast reduction after her last child.  Objective: Temp:  [97.8 F (36.6 C)-98.6 F (37 C)] 98.2 F (36.8 C) (05/17 0431) Pulse Rate:  [72-107] 83 (05/17 0431) Resp:  [18-28] 18 (05/17 0431) BP: (84-127)/(56-77) 115/56 mmHg (05/17 0431) SpO2:  [97 %-100 %] 97 % (05/17 0431)  Physical Exam: Gen: NAD Lochia: Not visualized Uterine Fundus: firm, appropriately tender Incision:  Dressing clean.  PICO in place. DVT Evaluation: + Edema present, no calf tenderness bilaterally    Recent Labs  01/14/15 0550  HGB 11.3*  HCT 34.1*    Assessment/Plan: Status post C-section-doing well postoperatively. H/o breast reduction.  May have lactation challenges.  Defer to lactation consultant. Outpatient circumcision. Morbid obesity.  PICO vacuum system in place. Encouraged ambulation in halls. TID.    Thurnell Lose 01/14/2015, 8:27 AM

## 2015-01-15 MED ORDER — DOCUSATE SODIUM 100 MG PO CAPS: 100.0000 mg | ORAL_CAPSULE | Freq: Two times a day (BID) | ORAL | Status: DC

## 2015-01-15 MED ORDER — SERTRALINE HCL 50 MG PO TABS: 50.0000 mg | ORAL_TABLET | Freq: Every day | ORAL | Status: DC

## 2015-01-15 MED ORDER — OXYCODONE-ACETAMINOPHEN 5-325 MG PO TABS: 1.0000 | ORAL_TABLET | Freq: Four times a day (QID) | ORAL | Status: DC | PRN

## 2015-01-15 NOTE — Progress Notes (Signed)
Subjective: Postop Day 2: Cesarean Delivery No complaints, resting comfortably in bed.  Pain controlled.  Lochia minimal.  Breast and bottle feeding.  +flatus, no BM.  Voiding and ambulating without difficulty.   Objective: Temp:  [97.9 F (36.6 C)-98.6 F (37 C)] 98.6 F (37 C) (05/18 0610) Pulse Rate:  [73-100] 73 (05/18 0610) Resp:  [16-18] 18 (05/18 0610) BP: (110-122)/(51-73) 110/51 mmHg (05/18 0610) SpO2:  [98 %] 98 % (05/17 0815)  Physical Exam: Gen: NAD CV: RRR Lungs: CTAB Abd: obese, soft, non-tender, +BS Uterine Fundus: firm, below umbilicus, appropriately tender Incision:  Dressing clean.  PICO in place. DVT Evaluation: 1+ non-pitting edema present, no calf tenderness bilaterally, no evidence of DVT  CBC Latest Ref Rng 01/14/2015 01/10/2015 09/14/2010  WBC 4.0 - 10.5 K/uL 8.2 7.8 9.0  Hemoglobin 12.0 - 15.0 g/dL 11.3(L) 11.8(L) 10.4(L)  Hematocrit 36.0 - 46.0 % 34.1(L) 35.2(L) 30.9(L)  Platelets 150 - 400 K/uL 326 338 386    Assessment/Plan: Status post C-section, POD #2-doing well postoperatively. H/o breast reduction.  May have lactation challenges.  Defer to lactation consultant. Outpatient circumcision. Morbid obesity.  PICO vacuum system in place. Continue with routine postoperative care.  Pt considering early discharge today, will recheck this afternoon.   Janyth Pupa, M 01/15/2015, 7:11 AM

## 2015-01-15 NOTE — Discharge Instructions (Signed)
Cesarean Delivery, Care After Refer to this sheet in the next few weeks. These instructions provide you with information on caring for yourself after your procedure. Your health care provider may also give you specific instructions. Your treatment has been planned according to current medical practices, but problems sometimes occur. Call your health care provider if you have any problems or questions after you go home. HOME CARE INSTRUCTIONS  For pain management you may alternate between Percocet for moderate pain and motrin.  The percocet may cause constipation, be sure to continue with colace (stool softener) twice daily as needed.  For the bandage- once the light stops flashing green, you may remove the dressing.  There are steri strips underneath.  Please keep the incision clean.  Do not drink alcohol, especially if you are breastfeeding or taking medication to relieve pain.  Do not chew or smoke tobacco.  Continue to use good perineal care. Good perineal care includes:  Wiping your perineum from front to back.  Keeping your perineum clean.  Check your surgical cut (incision) daily for increased redness, drainage, swelling, or separation of skin.  Clean your incision gently with soap and water every day, and then pat it dry. If your health care provider says it is okay, leave the incision uncovered. Use a bandage (dressing) if the incision is draining fluid or appears irritated. If the adhesive strips across the incision do not fall off within 7 days, carefully peel them off.  Hug a pillow when coughing or sneezing until your incision is healed. This helps to relieve pain.  Do not use tampons or douche until your health care provider says it is okay.  Shower, wash your hair, and take tub baths as directed by your health care provider.  Wear a well-fitting bra that provides breast support.  Limit wearing support panties or control-top hose.  Drink enough fluids to keep your urine  clear or pale yellow.  Eat high-fiber foods such as whole grain cereals and breads, brown rice, beans, and fresh fruits and vegetables every day. These foods may help prevent or relieve constipation.  Resume activities such as climbing stairs, driving, lifting, exercising, or traveling as directed by your health care provider.  Talk to your health care provider about resuming sexual activities. This is dependent upon your risk of infection, your rate of healing, and your comfort and desire to resume sexual activity.  Try to have someone help you with your household activities and your newborn for at least a few days after you leave the hospital.  Rest as much as possible. Try to rest or take a nap when your newborn is sleeping.  Increase your activities gradually.  Keep all of your scheduled postpartum appointments. It is very important to keep your scheduled follow-up appointments. At these appointments, your health care provider will be checking to make sure that you are healing physically and emotionally. SEEK MEDICAL CARE IF:   You are passing large clots from your vagina. Save any clots to show your health care provider.  You have a foul smelling discharge from your vagina.  You have trouble urinating.  You are urinating frequently.  You have pain when you urinate.  You have a change in your bowel movements.  You have increasing redness, pain, or swelling near your incision.  You have pus draining from your incision.  Your incision is separating.  You have painful, hard, or reddened breasts.  You have a severe headache.  You have blurred vision or  see spots.  You feel sad or depressed.  You have thoughts of hurting yourself or your newborn.  You have questions about your care, the care of your newborn, or medications.  You are dizzy or light-headed.  You have a rash.  You have pain, redness, or swelling at the site of the removed intravenous access (IV)  tube.  You have nausea or vomiting.  You stopped breastfeeding and have not had a menstrual period within 12 weeks of stopping.  You are not breastfeeding and have not had a menstrual period within 12 weeks of delivery.  You have a fever. SEEK IMMEDIATE MEDICAL CARE IF:  You have persistent pain.  You have chest pain.  You have shortness of breath.  You faint.  You have leg pain.  You have stomach pain.  Your vaginal bleeding saturates 2 or more sanitary pads in 1 hour. MAKE SURE YOU:   Understand these instructions.  Will watch your condition.  Will get help right away if you are not doing well or get worse. Document Released: 05/08/2002 Document Revised: 12/31/2013 Document Reviewed: 04/12/2012 [facility] Patient Information 2015 [place], [place]. This information is not intended to replace advice given to you by your health care provider. Make sure you discuss any questions you have with your health care provider.

## 2015-01-21 NOTE — Discharge Summary (Signed)
Obstetric Discharge Summary Reason for Admission: cesarean section Prenatal Procedures: none Intrapartum Procedures: cesarean: low cervical, transverse Postpartum Procedures: none Complications-Operative and Postpartum: none  HEMOGLOBIN  Date Value Ref Range Status  01/14/2015 11.3* 12.0 - 15.0 g/dL Final   HCT  Date Value Ref Range Status  01/14/2015 34.1* 36.0 - 46.0 % Final   Hospital Course: 29 y/o G2P1001 @ [redacted]w[redacted]d estimated gestational age (as dated by 10 week ultrasound) presents for scheduled repeat C-section. Pregnancy complicated by: 1) Obesity- BMI: 48 Size greater than dates: Last [place] @ [redacted]w[redacted]d- vertex/anterior/EFW: 5#8oz (50%) 2) Asthma- albuterol prn, has not required inhaler in several months 3) Depression/Anxiety- on zoloft 50mg  daily, mood appropriate, no SI/HI 4) Prior C-section for failure to progress, desires repeat  For information regarding the C-section, please see the operative report.  Her postoperative course was uncomplicated and she was discharged home in stable condition on Day #2.  Physical Exam:  Physical Exam: Gen: NAD CV: RRRLungs: CTAB Abd: obese, soft, non-tender, +BS Uterine Fundus: firm, below umbilicus, appropriately tender Incision: Dressing clean. PICO in place. DVT Evaluation: 1+ non-pitting edema present, no calf tenderness bilaterally, no evidence of DVT  Discharge Diagnoses: Term Pregnancy-delivered  Discharge Information: Date: 01/21/2015 Activity: pelvic rest Diet: routine Medications: PNV, Ibuprofen, Colace and Percocet Condition: stable Instructions: refer to practice specific booklet Discharge to: home Follow-up Information    Follow up with Janyth Pupa, M, DO In 2 weeks.   Specialty:  Obstetrics and Gynecology   Contact information:   [address] [place] [address] [phone]       Newborn Data: Live born female  Birth Weight: 8 lb 8 oz (3856 g) APGAR: 8, 4  Home with  mother.  Janyth Pupa, M 01/21/2015, 9:34 PM

## 2015-02-01 ENCOUNTER — Inpatient Hospital Stay (HOSPITAL_COMMUNITY)
Admission: AD | Admit: 2015-02-01 | Discharge: 2015-02-01 | Disposition: A | Discharge status: Home / Self Care | Payer: Medicaid Other | Source: Ambulatory Visit | Attending: Obstetrics & Gynecology | Admitting: Obstetrics & Gynecology

## 2015-02-01 ENCOUNTER — Encounter (HOSPITAL_COMMUNITY): Payer: Self-pay

## 2015-02-01 DIAGNOSIS — R102 Pelvic and perineal pain: Secondary | ICD-10-CM | POA: Diagnosis not present

## 2015-02-01 DIAGNOSIS — O9089 Other complications of the puerperium, not elsewhere classified: Secondary | ICD-10-CM | POA: Insufficient documentation

## 2015-02-01 DIAGNOSIS — R300 Dysuria: Secondary | ICD-10-CM | POA: Diagnosis present

## 2015-02-01 LAB — URINALYSIS, ROUTINE W REFLEX MICROSCOPIC
Bilirubin Urine: NEGATIVE
GLUCOSE, UA: NEGATIVE mg/dL
Ketones, ur: NEGATIVE mg/dL
Nitrite: NEGATIVE
Protein, ur: NEGATIVE mg/dL
SPECIFIC GRAVITY, URINE: 1.025 (ref 1.005–1.030)
UROBILINOGEN UA: 0.2 mg/dL (ref 0.0–1.0)
pH: 6 (ref 5.0–8.0)

## 2015-02-01 LAB — URINE MICROSCOPIC-ADD ON

## 2015-02-01 LAB — POCT PREGNANCY, URINE: PREG TEST UR: NEGATIVE

## 2015-02-01 MED ORDER — VALACYCLOVIR HCL 1 G PO TABS: 1000.0000 mg | ORAL_TABLET | Freq: Two times a day (BID) | ORAL | Status: AC

## 2015-02-01 MED ORDER — LIDOCAINE HCL 2 % EX GEL
1.0000 "application " | Freq: Once | CUTANEOUS | Status: AC
  Administered 2015-02-01: 13:00:00 via TOPICAL
  Filled 2015-02-01: qty 5

## 2015-02-01 MED ORDER — FLUCONAZOLE 150 MG PO TABS: 150.0000 mg | ORAL_TABLET | Freq: Every day | ORAL | Status: DC

## 2015-02-01 NOTE — MAU Note (Signed)
Burning with urination x 1 week, hurting vaginal area, C/S on 01/13/15 having some bleeding

## 2015-02-01 NOTE — Discharge Instructions (Signed)
Get your medicine from the pharmacy and take all as prescribed. Keep the area dry if possible and pat dry when urinating. If your symptoms are worse, call your doctor and be seen in the office.

## 2015-02-01 NOTE — MAU Provider Note (Signed)
History     CSN: [id number]  Arrival date and time: 02/01/15 1137   First Provider Initiated Contact with Patient 02/01/15 1229      Chief Complaint  Patient presents with  . Dysuria   HPI Kendra [name] 29 y.o. Was discharged after a C/S on 01-21-15.  On Wednesday began having some genital itching, today is having burning with urination and vulvar pain.  States she was in so much pain she was having trouble when walking down the stairs.  Additionally has a mild "cold" and has had nasal stuffiness, scratchy throat and headache.  Partner is with her and is has some URI symptoms as well.  Has postpartum visit scheduled on 02-18-15 but felt like she needed to be seen before then.  Had 2 UTIs in pregnancy.  OB History as of 01/15/15    Gravida Para Term Preterm AB TAB SAB Ectopic Multiple Living   2 2 2       0 1      Past Medical History  Diagnosis Date  . Asthma   . H/O: C-section   . Lipoma   . Lipoma of LUQ abdomen, s/p removal 06/27/2012 05/24/2012    Past Surgical History  Procedure Laterality Date  . Cesarean section  09/09/2010    [facility]  . Breast surgery  12/30/11    breast reduction  . Abdmonial mass  2013  . Cesarean section N/A 01/13/2015    Procedure: CESAREAN SECTION;  Surgeon: Janyth Pupa, DO;  Location: [facility];  Service: Obstetrics;  Laterality: N/A;  EDD 01/18/15 would like Pico dressing    Family History  Problem Relation Age of Onset  . Kidney disease Mother     kidney stones  . Heart disease Maternal Uncle   . Stroke Maternal Uncle   . Cancer Maternal Grandmother     breast  . Cancer Maternal Grandfather   . Deep vein thrombosis Maternal Grandfather     in throat  . Diabetes Paternal Grandfather   . Cancer Maternal Aunt     breast    History  Substance Use Topics  . Smoking status: Never Smoker   . Smokeless tobacco: Never Used  . Alcohol Use: No    Allergies:  Allergies  Allergen Reactions  . Shellfish Allergy Anaphylaxis, Hives and Swelling     Pt throat swells  . Asa Buff (Mag [Buffered Aspirin] Swelling  . Iodine Hives and Swelling    Prescriptions prior to admission  Medication Sig Dispense Refill Last Dose  . oxyCODONE-acetaminophen (PERCOCET/ROXICET) 5-325 MG per tablet Take 1 tablet by mouth every 6 (six) hours as needed (for pain scale 4-7). 30 tablet 0 01/31/2015 at Unknown time  . Prenatal Vit-Fe Fumarate-FA (PRENATAL MULTIVITAMIN) TABS tablet Take 1 tablet by mouth daily at 12 noon.   01/31/2015 at Unknown time  . sertraline (ZOLOFT) 50 MG tablet Take 1 tablet (50 mg total) by mouth daily. 30 tablet 6 01/31/2015 at Unknown time  . albuterol (PROVENTIL HFA;VENTOLIN HFA) 108 (90 BASE) MCG/ACT inhaler Inhale 2 puffs into the lungs every 6 (six) hours as needed for wheezing or shortness of breath.   rescue  . docusate sodium (COLACE) 100 MG capsule Take 1 capsule (100 mg total) by mouth 2 (two) times daily. (Patient not taking: Reported on 02/01/2015) 30 capsule 0   . EPINEPHrine 0.3 mg/0.3 mL IJ SOAJ injection Inject 0.3 mg into the muscle daily as needed (allergic reaction).   rescue  . nitrofurantoin, macrocrystal-monohydrate, (MACROBID) 100  MG capsule Take 1 capsule (100 mg total) by mouth 2 (two) times daily. (Patient not taking: Reported on 12/26/2014) 14 capsule 0 Completed Course at Unknown time  . ranitidine (ZANTAC) 150 MG tablet Take 1 tablet (150 mg total) by mouth 2 (two) times daily. (Patient not taking: Reported on 02/01/2015) 60 tablet 1 Past Month at Unknown time    Review of Systems  Constitutional: Negative for fever.  HENT: Positive for congestion and sore throat.   Gastrointestinal: Negative for abdominal pain.  Genitourinary: Positive for dysuria. Negative for urgency, frequency and flank pain.  Neurological: Positive for headaches.   Physical Exam   Blood pressure 125/79, pulse 85, temperature 98.7 F (37.1 C), temperature source Oral, resp. rate 18, last menstrual period 04/08/2014, unknown if currently  breastfeeding.  Physical Exam  Nursing note and vitals reviewed. Constitutional: She is oriented to person, place, and time. She appears well-developed and well-nourished.  obese  HENT:  Head: Normocephalic.  Some mild nasal stuffiness  Eyes: EOM are normal.  Neck: Neck supple.  Genitourinary:  Visualized vulva, dampness noted, no odor, client localized pain to clitoral area.  Inspection of clitoris revealed on tiny fissure (72mm in length) on the clitoris and a couple of pinpoint open areas in the mucus membrane and fold of the right clitoral hood.  HSV culture done and client was having lots of pain with the swab of the area.  Musculoskeletal: Normal range of motion.  Neurological: She is alert and oriented to person, place, and time.  Skin: Skin is warm and dry.  Psychiatric: She has a normal mood and affect.    MAU Course  Procedures Results for orders placed or performed during the hospital encounter of 02/01/15 (from the past 24 hour(s))  Urinalysis, Routine w reflex microscopic (not at [facility])     Status: Abnormal   Collection Time: 02/01/15 11:45 AM  Result Value Ref Range   Color, Urine YELLOW YELLOW   APPearance CLOUDY (A) CLEAR   Specific Gravity, Urine 1.025 1.005 - 1.030   pH 6.0 5.0 - 8.0   Glucose, UA NEGATIVE NEGATIVE mg/dL   Hgb urine dipstick LARGE (A) NEGATIVE   Bilirubin Urine NEGATIVE NEGATIVE   Ketones, ur NEGATIVE NEGATIVE mg/dL   Protein, ur NEGATIVE NEGATIVE mg/dL   Urobilinogen, UA 0.2 0.0 - 1.0 mg/dL   Nitrite NEGATIVE NEGATIVE   Leukocytes, UA SMALL (A) NEGATIVE  Urine microscopic-add on     Status: Abnormal   Collection Time: 02/01/15 11:45 AM  Result Value Ref Range   Squamous Epithelial / LPF FEW (A) RARE   WBC, UA 0-2 <3 WBC/hpf   RBC / HPF 21-50 <3 RBC/hpf   Bacteria, UA RARE RARE  Pregnancy, urine POC     Status: None   Collection Time: 02/01/15 11:49 AM  Result Value Ref Range   Preg Test, Ur NEGATIVE NEGATIVE    MDM Discussed with  client the possibility of HSV.  She has not ever had HSV previously and has not had intercourse since the birth.  Is very upset over this possibility.  Explained that the testing is only to rule out HSV as a cause of her problem today.  Stated exposure could have been years ago and having an outbreak now.  Could also be broken skin from friction - has been wearing a sanitary pad as she is still having some bleeding postpartum.  Could also be some skin breakdown due to yeast although she does not report any problems with  yeast infection.  No thick discharge seen on exam.  Given the symptoms, will treat with valtrex as a precaution and diflucan.    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 Follow up with your doctor. Lab results pending including urine culture to make sure no UTI. Xylocaine jelly to be used on the area if you cannot urinate.   BURLESON,TERRI 02/01/2015, 12:29 PM

## 2015-02-02 LAB — URINE CULTURE
Colony Count: 70000
SPECIAL REQUESTS: NORMAL

## 2015-02-03 LAB — HERPES SIMPLEX VIRUS CULTURE
CULTURE: NOT DETECTED
Special Requests: NORMAL

## 2015-05-05 ENCOUNTER — Encounter (HOSPITAL_COMMUNITY): Payer: Self-pay | Admitting: Emergency Medicine

## 2015-05-05 ENCOUNTER — Emergency Department (HOSPITAL_COMMUNITY)
Admission: EM | Admit: 2015-05-05 | Discharge: 2015-05-05 | Disposition: A | Discharge status: Home / Self Care | Payer: Medicaid Other | Attending: Emergency Medicine | Admitting: Emergency Medicine

## 2015-05-05 DIAGNOSIS — Y9289 Other specified places as the place of occurrence of the external cause: Secondary | ICD-10-CM | POA: Insufficient documentation

## 2015-05-05 DIAGNOSIS — Y9389 Activity, other specified: Secondary | ICD-10-CM | POA: Insufficient documentation

## 2015-05-05 DIAGNOSIS — S3992XA Unspecified injury of lower back, initial encounter: Secondary | ICD-10-CM | POA: Insufficient documentation

## 2015-05-05 DIAGNOSIS — J45909 Unspecified asthma, uncomplicated: Secondary | ICD-10-CM | POA: Diagnosis not present

## 2015-05-05 DIAGNOSIS — Z8619 Personal history of other infectious and parasitic diseases: Secondary | ICD-10-CM | POA: Diagnosis not present

## 2015-05-05 DIAGNOSIS — W19XXXA Unspecified fall, initial encounter: Secondary | ICD-10-CM

## 2015-05-05 DIAGNOSIS — Y998 Other external cause status: Secondary | ICD-10-CM | POA: Diagnosis not present

## 2015-05-05 DIAGNOSIS — W1839XA Other fall on same level, initial encounter: Secondary | ICD-10-CM | POA: Insufficient documentation

## 2015-05-05 DIAGNOSIS — M549 Dorsalgia, unspecified: Secondary | ICD-10-CM

## 2015-05-05 DIAGNOSIS — Z79899 Other long term (current) drug therapy: Secondary | ICD-10-CM | POA: Insufficient documentation

## 2015-05-05 DIAGNOSIS — S199XXA Unspecified injury of neck, initial encounter: Secondary | ICD-10-CM | POA: Diagnosis not present

## 2015-05-05 MED ORDER — METHOCARBAMOL 500 MG PO TABS: 500.0000 mg | ORAL_TABLET | Freq: Four times a day (QID) | ORAL | Status: DC | PRN

## 2015-05-05 MED ORDER — METHOCARBAMOL 500 MG PO TABS
1000.0000 mg | ORAL_TABLET | Freq: Once | ORAL | Status: AC
  Administered 2015-05-05: 1000 mg via ORAL
  Filled 2015-05-05: qty 2

## 2015-05-05 NOTE — Discharge Instructions (Signed)
Read the information below.  Use the prescribed medication as directed.  Please discuss all new medications with your pharmacist.  You may return to the Emergency Department at any time for worsening condition or any new symptoms that concern you.  If there is any possibility that you might be pregnant or if you are breastfeeding, please let your health care provider know and discuss this with the pharmacist to ensure medication safety.   If you develop fevers, loss of control of bowel or bladder, weakness or numbness in your legs, or are unable to walk, return to the ER for a recheck.    Back Pain, Adult Back pain is very common. The pain often gets better over time. The cause of back pain is usually not dangerous. Most people can learn to manage their back pain on their own.  HOME CARE   Stay active. Start with short walks on flat ground if you can. Try to walk farther each day.  Do not sit, drive, or stand in one place for more than 30 minutes. Do not stay in bed.  Do not avoid exercise or work. Activity can help your back heal faster.  Be careful when you bend or lift an object. Bend at your knees, keep the object close to you, and do not twist.  Sleep on a firm mattress. Lie on your side, and bend your knees. If you lie on your back, put a pillow under your knees.  Only take medicines as told by your doctor.  Put ice on the injured area.  Put ice in a plastic bag.  Place a towel between your skin and the bag.  Leave the ice on for 15-20 minutes, 03-04 times a day for the first 2 to 3 days. After that, you can switch between ice and heat packs.  Ask your doctor about back exercises or massage.  Avoid feeling anxious or stressed. Find good ways to deal with stress, such as exercise. GET HELP RIGHT AWAY IF:   Your pain does not go away with rest or medicine.  Your pain does not go away in 1 week.  You have new problems.  You do not feel well.  The pain spreads into your  legs.  You cannot control when you poop (bowel movement) or pee (urinate).  Your arms or legs feel weak or lose feeling (numbness).  You feel sick to your stomach (nauseous) or throw up (vomit).  You have belly (abdominal) pain.  You feel like you may pass out (faint). MAKE SURE YOU:   Understand these instructions.  Will watch your condition.  Will get help right away if you are not doing well or get worse. Document Released: 02/02/2008 Document Revised: 11/08/2011 Document Reviewed: 12/18/2013 [facility] Patient Information 2015 [place], [place]. This information is not intended to replace advice given to you by your health care provider. Make sure you discuss any questions you have with your health care provider.

## 2015-05-05 NOTE — ED Provider Notes (Signed)
CSN: [id number]     Arrival date & time 05/05/15  1244 History  This chart was scribed for non-physician practitioner working Clayton Bibles, PA-C with Malvin Johns, MD by Meriel Pica, ED Scribe. This patient was seen in room TR11C/TR11C and the patient's care was started at 1:10 PM.      Chief Complaint  Patient presents with  . Fall  . Back Pain   The history is provided by the patient. No language interpreter was used.   HPI Comments: Kendra [name] is a 29 y.o. female who presents to the Emergency Department complaining of delayed onset, gradually worsening, constant, moderate left-sided pain that ranges from her head down to her knee  onset 1 day ago s/p fall that occurred 2 days ago. She describes the pain to be a stiff pain. Pt reports 2 days ago she slipped on water on the ground and fell flat on her back, hitting her head. Pt reports she fell on a concrete or marble floor. She states she did not experience pain immediately after the fall but became sore the next day. She took 800mg  ibuprofen which briefly alleviated her symptoms.  She has started developing mild sore throat and rhinorrhea today.  No other sick symptoms.  Denies LOC, confusion or disorientation after the fall, hematuria, blood in stool, abdominal pain, CP, SOB, nausea, vomiting, bladder or bowel incontinence, or numbness or weakness in extremities.    Past Medical History  Diagnosis Date  . Asthma   . H/O: C-section   . Lipoma   . Lipoma of LUQ abdomen, s/p removal 06/27/2012 05/24/2012   Past Surgical History  Procedure Laterality Date  . Cesarean section  09/09/2010    [place]  . Breast surgery  12/30/11    breast reduction  . Abdmonial mass  2013  . Cesarean section N/A 01/13/2015    Procedure: CESAREAN SECTION;  Surgeon: Janyth Pupa, DO;  Location: [facility];  Service: Obstetrics;  Laterality: N/A;  EDD 01/18/15 would like Pico dressing   Family History  Problem Relation Age of Onset  . Kidney disease Mother    kidney stones  . Heart disease Maternal Uncle   . Stroke Maternal Uncle   . Cancer Maternal Grandmother     breast  . Cancer Maternal Grandfather   . Deep vein thrombosis Maternal Grandfather     in throat  . Diabetes Paternal Grandfather   . Cancer Maternal Aunt     breast   Social History  Substance Use Topics  . Smoking status: Never Smoker   . Smokeless tobacco: Never Used  . Alcohol Use: No   OB History    Gravida Para Term Preterm AB TAB SAB Ectopic Multiple Living   2 2 2       0 2     Review of Systems  HENT: Positive for rhinorrhea and sore throat.   Respiratory: Negative for cough and shortness of breath.   Cardiovascular: Negative for chest pain.  Gastrointestinal: Negative for nausea, vomiting, abdominal pain and blood in stool.  Genitourinary: Negative for hematuria.  Musculoskeletal: Positive for back pain ( left-sided), arthralgias ( left lower extremity ) and neck pain ( left-sided).  Allergic/Immunologic: Negative for immunocompromised state.  Neurological: Negative for syncope, weakness and numbness.  Hematological: Does not bruise/bleed easily.  Psychiatric/Behavioral: Negative for confusion.  All other systems reviewed and are negative.  Allergies  Shellfish allergy; Asa buff (mag; and Iodine  Home Medications   Prior to Admission medications  Medication Sig Start Date End Date Taking? Authorizing Provider  albuterol (PROVENTIL HFA;VENTOLIN HFA) 108 (90 BASE) MCG/ACT inhaler Inhale 2 puffs into the lungs every 6 (six) hours as needed for wheezing or shortness of breath.    Historical Provider, MD  docusate sodium (COLACE) 100 MG capsule Take 1 capsule (100 mg total) by mouth 2 (two) times daily. Patient not taking: Reported on 02/01/2015 01/15/15   Janyth Pupa, DO  EPINEPHrine 0.3 mg/0.3 mL IJ SOAJ injection Inject 0.3 mg into the muscle daily as needed (allergic reaction).    Historical Provider, MD  fluconazole (DIFLUCAN) 150 MG tablet Take 1 tablet  (150 mg total) by mouth daily. 02/01/15   Virginia Rochester, NP  oxyCODONE-acetaminophen (PERCOCET/ROXICET) 5-325 MG per tablet Take 1 tablet by mouth every 6 (six) hours as needed (for pain scale 4-7). 01/15/15   Janyth Pupa, DO  Prenatal Vit-Fe Fumarate-FA (PRENATAL MULTIVITAMIN) TABS tablet Take 1 tablet by mouth daily at 12 noon.    Historical Provider, MD  sertraline (ZOLOFT) 50 MG tablet Take 1 tablet (50 mg total) by mouth daily. 01/15/15   Jennifer Ozan, DO   BP 127/90 mmHg  Pulse 78  Temp(Src) 98.4 F (36.9 C) (Oral)  Resp 16  Ht 5\' 1"  (1.549 m)  Wt 235 lb 1.6 oz (106.641 kg)  BMI 44.44 kg/m2  SpO2 99%  LMP 04/21/2015 (Approximate) Physical Exam  Constitutional: She appears well-developed and well-nourished. No distress.  HENT:  Head: Normocephalic and atraumatic.  Neck: Neck supple.  Cardiovascular: Normal rate and regular rhythm.   Pulmonary/Chest: Effort normal and breath sounds normal. No respiratory distress. She has no wheezes. She has no rales.  Abdominal: Soft. She exhibits no distension and no mass. There is no tenderness. There is no rebound and no guarding.  Musculoskeletal:  Spine nontender, no crepitus, or stepoffs. Upper and lower extremities:  Strength 5/5, sensation intact, distal pulses intact.    Diffuse tenderness throughout musculature of left neck and back without focal tenderness.    Neurological: She is alert.  CN II-XII intact, EOMs intact, no pronator drift, grip strengths equal bilaterally; strength 5/5 in all extremities, sensation intact in all extremities; finger to nose, heel to shin, rapid alternating movements normal; gait is normal.     Skin: She is not diaphoretic.  Psychiatric: She has a normal mood and affect. Her behavior is normal. Thought content normal.  Nursing note and vitals reviewed.   ED Course  Procedures  DIAGNOSTIC STUDIES: Oxygen Saturation is 99% on RA, normal by my interpretation.    COORDINATION OF CARE: 1:20 PM  Discussed treatment plan with pt. Pt acknowledges and agrees to plan.   Labs Review Labs Reviewed - No data to display  Imaging Review No results found. I have personally reviewed and evaluated these images and lab results as part of my medical decision-making.   EKG Interpretation None      MDM   Final diagnoses:  Fall, initial encounter  Left-sided back pain, unspecified location    Afebrile, nontoxic patient with pain throughout her left side since fall two days ago.  She slipped and fell backwards, did not develop pain until the next day.  No focal tenderness. No bony tenderness of spine or any red flags for back pain.  Pain is relieved with ibuprofen, though temporarily.   D/C home with robaxin, recommendations for conservative treatments at home, PCP follow up.  Discussed result, findings, treatment, and follow up  with patient.  Pt given return  precautions.  Pt verbalizes understanding and agrees with plan.        I personally performed the services described in this documentation, which was scribed in my presence. The recorded information has been reviewed and is accurate.    Clayton Bibles, PA-C 05/05/15 1453  Malvin Johns, MD 05/05/15 [phone]

## 2015-05-05 NOTE — ED Notes (Signed)
Pt from home for eval of fall on Saturday after slipping in water at a family reunion, pt states no LOC and denies taking any blood thinners at this time. Pt states left sided lower back pain and neck pain. Ambulatory at triage, nad noted.

## 2015-08-30 ENCOUNTER — Emergency Department (HOSPITAL_COMMUNITY)
Admission: EM | Admit: 2015-08-30 | Discharge: 2015-08-30 | Disposition: A | Discharge status: Home / Self Care | Payer: Medicaid Other | Attending: Emergency Medicine | Admitting: Emergency Medicine

## 2015-08-30 ENCOUNTER — Encounter (HOSPITAL_COMMUNITY): Payer: Self-pay | Admitting: Emergency Medicine

## 2015-08-30 DIAGNOSIS — Z79899 Other long term (current) drug therapy: Secondary | ICD-10-CM | POA: Diagnosis not present

## 2015-08-30 DIAGNOSIS — M79671 Pain in right foot: Secondary | ICD-10-CM | POA: Diagnosis present

## 2015-08-30 DIAGNOSIS — Z86018 Personal history of other benign neoplasm: Secondary | ICD-10-CM | POA: Insufficient documentation

## 2015-08-30 DIAGNOSIS — L600 Ingrowing nail: Secondary | ICD-10-CM

## 2015-08-30 DIAGNOSIS — J45909 Unspecified asthma, uncomplicated: Secondary | ICD-10-CM | POA: Insufficient documentation

## 2015-08-30 MED ORDER — SULFAMETHOXAZOLE-TRIMETHOPRIM 800-160 MG PO TABS: 1.0000 | ORAL_TABLET | Freq: Two times a day (BID) | ORAL | Status: DC

## 2015-08-30 MED ORDER — HYDROCODONE-ACETAMINOPHEN 5-325 MG PO TABS: 1.0000 | ORAL_TABLET | Freq: Four times a day (QID) | ORAL | Status: DC | PRN

## 2015-08-30 MED ORDER — SULFAMETHOXAZOLE-TRIMETHOPRIM 800-160 MG PO TABS: 1.0000 | ORAL_TABLET | Freq: Two times a day (BID) | ORAL | Status: AC

## 2015-08-30 MED ORDER — LIDOCAINE HCL (PF) 1 % IJ SOLN
5.0000 mL | Freq: Once | INTRAMUSCULAR | Status: AC
  Administered 2015-08-30: 5 mL
  Filled 2015-08-30: qty 5

## 2015-08-30 MED ORDER — BUPIVACAINE HCL (PF) 0.25 % IJ SOLN
10.0000 mL | Freq: Once | INTRAMUSCULAR | Status: AC
  Administered 2015-08-30: 10 mL
  Filled 2015-08-30: qty 30

## 2015-08-30 NOTE — ED Notes (Signed)
Declined W/C at D/C and was escorted to lobby by RN. 

## 2015-08-30 NOTE — ED Provider Notes (Signed)
CSN: [id number]     Arrival date & time 08/30/15  1346 History  By signing my name below, I, Meriel Pica, attest that this documentation has been prepared under the direction and in the presence of Debroah Baller, NP.  Electronically Signed: Meriel Pica, ED Scribe. 08/30/2015. 4:53 PM.   Chief Complaint  Patient presents with  . Ingrown Toenail   Patient is a 29 y.o. female presenting with toe pain. The history is provided by the patient. No language interpreter was used.  Toe Pain This is a recurrent problem. The current episode started more than 2 days ago. The problem occurs constantly. The problem has been gradually worsening. Exacerbated by: palpation. Nothing relieves the symptoms. Treatments tried: warm water and apple cider vinegar  The treatment provided no relief.   HPI Comments: Kendra [name] is a 29 y.o. female, with no pertinent PMhx, who presents to the Emergency Department complaining of gradually worsening, constant, moderate pain, swelling and erythema surrounding the medial nail bed of the right great toe X 3 days. Pt states 'I have an ingrown toenail'. She reports this problem is recurrent but her current symptoms are worse than normal. She soaked the toe in warm water and apple cider vinegar last night without relief. Pt reports she noticed a pustule appear to the affected area today. Pt denies any associated symptoms. Denies a chance of pregnancy, fevers or chills. Pt UTD on tetanus. NKDA. Pt not immunocompromised.   Past Medical History  Diagnosis Date  . Asthma   . H/O: C-section   . Lipoma   . Lipoma of LUQ abdomen, s/p removal 06/27/2012 05/24/2012   Past Surgical History  Procedure Laterality Date  . Cesarean section  09/09/2010    [place]  . Breast surgery  12/30/11    breast reduction  . Abdmonial mass  2013  . Cesarean section N/A 01/13/2015    Procedure: CESAREAN SECTION;  Surgeon: Janyth Pupa, DO;  Location: [facility];  Service: Obstetrics;  Laterality: N/A;   EDD 01/18/15 would like Pico dressing   Family History  Problem Relation Age of Onset  . Kidney disease Mother     kidney stones  . Heart disease Maternal Uncle   . Stroke Maternal Uncle   . Cancer Maternal Grandmother     breast  . Cancer Maternal Grandfather   . Deep vein thrombosis Maternal Grandfather     in throat  . Diabetes Paternal Grandfather   . Cancer Maternal Aunt     breast   Social History  Substance Use Topics  . Smoking status: Never Smoker   . Smokeless tobacco: Never Used  . Alcohol Use: No   OB History    Gravida Para Term Preterm AB TAB SAB Ectopic Multiple Living   2 2 2       0 2     Review of Systems  Constitutional: Negative for fever and chills.  Musculoskeletal: Positive for arthralgias ( right great toe).  Skin: Positive for color change ( erythema surrounding right great toe nail bed).  Allergic/Immunologic: Negative for immunocompromised state.  All other systems reviewed and are negative.  Allergies  Shellfish allergy; Asa buff (mag; and Iodine  Home Medications   Prior to Admission medications   Medication Sig Start Date End Date Taking? Authorizing Provider  albuterol (PROVENTIL HFA;VENTOLIN HFA) 108 (90 BASE) MCG/ACT inhaler Inhale 2 puffs into the lungs every 6 (six) hours as needed for wheezing or shortness of breath.   Yes Historical Provider, MD  sertraline (ZOLOFT) 50 MG tablet Take 1 tablet (50 mg total) by mouth daily. 01/15/15  Yes Jennifer Ozan, DO  docusate sodium (COLACE) 100 MG capsule Take 1 capsule (100 mg total) by mouth 2 (two) times daily. Patient not taking: Reported on 02/01/2015 01/15/15   Janyth Pupa, DO  EPINEPHrine 0.3 mg/0.3 mL IJ SOAJ injection Inject 0.3 mg into the muscle daily as needed (allergic reaction).    Historical Provider, MD  fluconazole (DIFLUCAN) 150 MG tablet Take 1 tablet (150 mg total) by mouth daily. Patient not taking: Reported on 08/30/2015 02/01/15   Virginia Rochester, NP   HYDROcodone-acetaminophen (NORCO) 5-325 MG tablet Take 1 tablet by mouth every 6 (six) hours as needed. 08/30/15   Hope Bunnie Pion, NP  methocarbamol (ROBAXIN) 500 MG tablet Take 1-2 tablets (500-1,000 mg total) by mouth every 6 (six) hours as needed for muscle spasms (and pain). Patient not taking: Reported on 08/30/2015 05/05/15   Clayton Bibles, PA-C  oxyCODONE-acetaminophen (PERCOCET/ROXICET) 5-325 MG per tablet Take 1 tablet by mouth every 6 (six) hours as needed (for pain scale 4-7). Patient not taking: Reported on 08/30/2015 01/15/15   Janyth Pupa, DO  Prenatal Vit-Fe Fumarate-FA (PRENATAL MULTIVITAMIN) TABS tablet Take 1 tablet by mouth daily at 12 noon.    Historical Provider, MD  sulfamethoxazole-trimethoprim (BACTRIM DS,SEPTRA DS) 800-160 MG tablet Take 1 tablet by mouth 2 (two) times daily. 08/30/15 09/06/15  Hope Bunnie Pion, NP   BP 126/78 mmHg  Pulse 73  Temp(Src) 98.4 F (36.9 C) (Oral)  Resp 20  SpO2 98% Physical Exam  Constitutional: She is oriented to person, place, and time. She appears well-developed and well-nourished.  HENT:  Head: Normocephalic.  Eyes: EOM are normal.  Neck: Neck supple.  Cardiovascular: Normal rate.   Pulmonary/Chest: Effort normal.  Abdominal: Soft. There is no tenderness.  Musculoskeletal: Normal range of motion.       Feet:  Right great toe; minimal swelling, erythema and purulent drainage to the medial aspect of the nail.   Neurological: She is alert and oriented to person, place, and time. No cranial nerve deficit.  Skin: Skin is warm and dry. There is erythema.  Psychiatric: She has a normal mood and affect. Her behavior is normal.  Nursing note and vitals reviewed.   ED Course  Procedures  DIAGNOSTIC STUDIES: Oxygen Saturation is 98% on RA, normal by my interpretation.    COORDINATION OF CARE: 4:15 PM Discussed treatment plan which includes to perform I&D procedure with pt. Pt acknowledges and agrees to plan.    PROCEDURE NOTE: Removal of  infected ingrown toenail  Patient identification was confirmed and verbal consent was obtained. This procedure was performed by Debroah Baller, NP at 4:48 PM. Site: medial aspect of right great toe Sterile procedures observed Anesthetic used (type and amt): digital block performed with marcaine 0.25% and lidocaine 1% w/o epinephrine total of 5 ccs Using a hemostat, nail elevated and using straight scissors 1/8 portion of the nail removed Drainage: moderate purulent drainage covered with dry, sterile dressing, post op shoe.   Pt tolerated procedure well without complications.   Instructions for care discussed verbally and pt provided with additional written instructions for homecare and f/u.  MDM   Final diagnoses:  Ingrown right big toenail   Patient with infected ingrown toenail of right great toe. Partial nail removal and drainage performed in the ED today.  Wound recheck as needed. Supportive care and return precautions discussed.  Pt sent home with short course  of bactrim. The patient appears reasonably screened and/or stabilized for discharge and I doubt any other emergent medical condition requiring further screening, evaluation, or treatment in the ED prior to discharge.  I personally performed the services described in this documentation, which was scribed in my presence. The recorded information has been reviewed and is accurate.   [place], NP 08/30/15 [place], MD 08/31/15 [phone]

## 2015-08-30 NOTE — Discharge Instructions (Signed)
Take ibuprofen in addition to the other medications. Do not drive while taking the narcotic as it will make you sleepy. Return as needed for any problems.

## 2015-08-30 NOTE — ED Notes (Signed)
Patient states R great toenail ingrown.  Patient states swelling and redness.   Patient states has been going on for 3 days.  Denies other symptoms.

## 2016-04-10 ENCOUNTER — Encounter (HOSPITAL_COMMUNITY): Payer: Self-pay

## 2016-04-10 ENCOUNTER — Emergency Department (HOSPITAL_COMMUNITY)
Admission: EM | Admit: 2016-04-10 | Discharge: 2016-04-11 | Disposition: A | Discharge status: Home / Self Care | Payer: 59 | Attending: Emergency Medicine | Admitting: Emergency Medicine

## 2016-04-10 DIAGNOSIS — R531 Weakness: Secondary | ICD-10-CM | POA: Insufficient documentation

## 2016-04-10 DIAGNOSIS — X58XXXA Exposure to other specified factors, initial encounter: Secondary | ICD-10-CM | POA: Diagnosis not present

## 2016-04-10 DIAGNOSIS — Z7951 Long term (current) use of inhaled steroids: Secondary | ICD-10-CM | POA: Diagnosis not present

## 2016-04-10 DIAGNOSIS — J45909 Unspecified asthma, uncomplicated: Secondary | ICD-10-CM | POA: Diagnosis not present

## 2016-04-10 DIAGNOSIS — L509 Urticaria, unspecified: Secondary | ICD-10-CM | POA: Diagnosis not present

## 2016-04-10 DIAGNOSIS — T7840XA Allergy, unspecified, initial encounter: Secondary | ICD-10-CM | POA: Insufficient documentation

## 2016-04-10 NOTE — ED Notes (Signed)
Per EMS- pt at home ate something, had an allergic reaction, hives, vomiting. No difficulty breathing, facial swelling. Hx of shellfish allergy. Used her own expired epi pen. Took 12.5mg  liquid benadryl. .3mg  1:1000 Im epi given by EMS.

## 2016-04-10 NOTE — ED Provider Notes (Signed)
[facility] DEPT Provider Note   CSN: [id number] Arrival date & time: 04/10/16  2324  First Provider Contact:  First MD Initiated Contact with Patient 04/10/16 2350   By signing my name below, I, Dora Sims, attest that this documentation has been prepared under the direction and in the presence of Charlann Lange, PA-C. Electronically Signed: Dora Sims, Scribe. 04/10/2016. 11:50 PM.   History   Chief Complaint Chief Complaint  Patient presents with  . Allergic Reaction    The history is provided by the patient. No language interpreter was used.     HPI Comments: Kendra [name] is a 30 y.o. female brought in by EMS, with PMHx of asthma, who presents to the Emergency Department complaining of allergic reaction occurring about an hour ago. Pt does not know the cause of her allergic reaction; she does not believe it was food related and denies using new medications recently. Pt endorses generalized weakness, nausea, vomiting, SOB, and swelling of her tongue/lips. Pt states her hands and feet felt like "they were on fire" when her symptoms initially presented. She states her boyfriend administered her EpiPen before getting on the EMS van and again while on the EMS [facility] Lei; she states this improved her symptoms significantly. Her last EpiPen shot was about 20 minutes ago. She does not know if she was administered steroids on the EMS van. Pt states she is still feeling weak and nauseous currently. She notes an associated right forearm rash as well; she states it was pruritic initially but is not currently. She denies wheezing or any other associated symptoms.  Past Medical History:  Diagnosis Date  . Asthma   . H/O: C-section   . Lipoma   . Lipoma of LUQ abdomen, s/p removal 06/27/2012 05/24/2012    Patient Active Problem List   Diagnosis Date Noted  . Status post repeat low transverse cesarean section 01/14/2015  . Lipoma of LUQ abdomen, s/p removal 06/27/2012 05/24/2012  . 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 Chlamydia infection 12/13/2011  . 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 abdmonial mass  2013  . BREAST SURGERY  12/30/11   breast reduction  . CESAREAN SECTION  09/09/2010   [place]  . CESAREAN SECTION N/A 01/13/2015   Procedure: CESAREAN SECTION;  Surgeon: Janyth Pupa, DO;  Location: [facility];  Service: Obstetrics;  Laterality: N/A;  EDD 01/18/15 would like Pico dressing    OB History    Gravida Para Term Preterm AB Living   2 2 2     2    SAB TAB Ectopic Multiple Live Births         0 1       Home Medications    Prior to Admission medications   Medication Sig Start Date End Date Taking? Authorizing Provider  albuterol (PROVENTIL HFA;VENTOLIN HFA) 108 (90 BASE) MCG/ACT inhaler Inhale 2 puffs into the lungs every 6 (six) hours as needed for wheezing or shortness of breath.    Historical Provider, MD  docusate sodium (COLACE) 100 MG capsule Take 1 capsule (100 mg total) by mouth 2 (two) times daily. Patient not taking: Reported on 02/01/2015 01/15/15   Janyth Pupa, DO  EPINEPHrine 0.3 mg/0.3 mL IJ SOAJ injection Inject 0.3 mg into the muscle daily as needed (allergic reaction).    Historical Provider, MD  fluconazole (DIFLUCAN) 150 MG tablet Take 1 tablet (150 mg total) by mouth daily. Patient not taking:  Reported on 08/30/2015 02/01/15   Virginia Rochester, NP  HYDROcodone-acetaminophen (NORCO) 5-325 MG tablet Take 1 tablet by mouth every 6 (six) hours as needed. 08/30/15   Hope Bunnie Pion, NP  methocarbamol (ROBAXIN) 500 MG tablet Take 1-2 tablets (500-1,000 mg total) by mouth every 6 (six) hours as needed for muscle spasms (and pain). Patient not taking: Reported on 08/30/2015 05/05/15   Clayton Bibles, PA-C  oxyCODONE-acetaminophen (PERCOCET/ROXICET) 5-325 MG per tablet Take 1 tablet by mouth every 6 (six) hours as needed (for pain scale 4-7). Patient not taking: Reported on 08/30/2015  01/15/15   Janyth Pupa, DO  Prenatal Vit-Fe Fumarate-FA (PRENATAL MULTIVITAMIN) TABS tablet Take 1 tablet by mouth daily at 12 noon.    Historical Provider, MD  sertraline (ZOLOFT) 50 MG tablet Take 1 tablet (50 mg total) by mouth daily. 01/15/15   Janyth Pupa, DO    Family History Family History  Problem Relation Age of Onset  . Kidney disease Mother     kidney stones  . Cancer Maternal Grandmother     breast  . Cancer Maternal Grandfather   . Deep vein thrombosis Maternal Grandfather     in throat  . Cancer Maternal Aunt     breast  . Heart disease Maternal Uncle   . Stroke Maternal Uncle   . Diabetes Paternal Grandfather     Social History Social History  Substance Use Topics  . Smoking status: Never Smoker  . Smokeless tobacco: Never Used  . Alcohol use No     Allergies   Shellfish allergy; Asa buff (mag [buffered aspirin]; and Iodine   Review of Systems Review of Systems  HENT: Positive for facial swelling.   Respiratory: Positive for shortness of breath (resolved). Negative for wheezing.   Gastrointestinal: Positive for nausea and vomiting.  Skin: Positive for rash (right forearm).  Neurological: Positive for weakness (generalized).     Physical Exam Updated Vital Signs BP 117/68 (BP Location: Left Arm)   Pulse 80   Temp 97.6 F (36.4 C) (Oral)   Resp 16   SpO2 93%   Physical Exam  Constitutional: She is oriented to person, place, and time. She appears well-developed and well-nourished. No distress.  HENT:  Head: Normocephalic and atraumatic.  No oral swelling.  Eyes: Conjunctivae and EOM are normal.  Neck: Neck supple. No tracheal deviation present.  Cardiovascular: Normal rate.   Pulmonary/Chest: Effort normal. No respiratory distress. She has no wheezes.  Full air movement.  Musculoskeletal: Normal range of motion.  Neurological: She is alert and oriented to person, place, and time.  Skin: Skin is warm and dry. There is erythema.    Hive-like rash with minimal erythema to upper extremities.  Psychiatric: She has a normal mood and affect. Her behavior is normal.  Nursing note and vitals reviewed.    ED Treatments / Results  Labs (all labs ordered are listed, but only abnormal results are displayed) Labs Reviewed - No data to display  EKG  EKG Interpretation None       Radiology No results found.  Procedures Procedures (including critical care time)  DIAGNOSTIC STUDIES: Oxygen Saturation is 93% on RA, adequate by my interpretation.    COORDINATION OF CARE: 11:50 PM Discussed treatment plan with pt at bedside and pt agreed to plan.  Medications Ordered in ED Medications - No data to display   Initial Impression / Assessment and Plan / ED Course  I have reviewed the triage vital signs and the nursing notes.  Pertinent labs & imaging results that were available during my care of the patient were reviewed by me and considered in my medical decision making (see chart for details).  Clinical Course    Patient presents for evaluation and treatment of acute allergic reaction which started one hour prior to arrival. She received Epi IM injections x 2 (one at home, one by EMS) for symptoms of SOB, tongue swelling, rash. Unknown allergen contact.   O2 saturation 90-91%. No symptoms currently. She has a residual hive-like rash to UE's and moderate bilateral hand swelling. IV Solumedrol, Pepcid and Benadryl provided.   Blood pressure drops sharply after IV benadryl but improves with IVF bolus. She is resting quietly without additional symptoms.   Plan is to observe for 4 hours from the last administration of Epi., observation period ending 3:30.  No further symptoms of allergic reaction. VSS. She can be discharged home.   I personally performed the services described in this documentation, which was scribed in my presence. The recorded information has been reviewed and is accurate.     Final Clinical  Impressions(s) / ED Diagnoses   Final diagnoses:  None   1. Acute allergic reaction  New Prescriptions New Prescriptions   No medications on file     Charlann Lange, PA-C 04/11/16 [place], MD 04/11/16 [phone]

## 2016-04-10 NOTE — ED Notes (Signed)
Bed: WA20 Expected date: 04/10/16 Expected time:  Means of arrival:  Comments: 29 YO/F Allergic reaction

## 2016-04-11 DIAGNOSIS — Z7951 Long term (current) use of inhaled steroids: Secondary | ICD-10-CM | POA: Diagnosis not present

## 2016-04-11 DIAGNOSIS — T7840XA Allergy, unspecified, initial encounter: Secondary | ICD-10-CM | POA: Diagnosis not present

## 2016-04-11 DIAGNOSIS — R531 Weakness: Secondary | ICD-10-CM | POA: Diagnosis not present

## 2016-04-11 DIAGNOSIS — J45909 Unspecified asthma, uncomplicated: Secondary | ICD-10-CM | POA: Diagnosis not present

## 2016-04-11 MED ORDER — FAMOTIDINE IN NACL 20-0.9 MG/50ML-% IV SOLN
20.0000 mg | Freq: Once | INTRAVENOUS | Status: AC
  Administered 2016-04-11: 20 mg via INTRAVENOUS
  Filled 2016-04-11: qty 50

## 2016-04-11 MED ORDER — DIPHENHYDRAMINE HCL 25 MG PO TABS: 25.0000 mg | ORAL_TABLET | Freq: Four times a day (QID) | ORAL | 0 refills | Status: DC | PRN

## 2016-04-11 MED ORDER — PREDNISONE 20 MG PO TABS: 60.0000 mg | ORAL_TABLET | Freq: Every day | ORAL | 0 refills | Status: DC

## 2016-04-11 MED ORDER — FAMOTIDINE 20 MG PO TABS: 20.0000 mg | ORAL_TABLET | Freq: Two times a day (BID) | ORAL | 0 refills | Status: DC

## 2016-04-11 MED ORDER — EPINEPHRINE 0.3 MG/0.3ML IJ SOAJ: 0.3000 mg | Freq: Once | INTRAMUSCULAR | 0 refills | Status: AC

## 2016-04-11 MED ORDER — SODIUM CHLORIDE 0.9 % IV BOLUS (SEPSIS)
1000.0000 mL | Freq: Once | INTRAVENOUS | Status: AC
  Administered 2016-04-11: 1000 mL via INTRAVENOUS

## 2016-04-11 MED ORDER — DIPHENHYDRAMINE HCL 50 MG/ML IJ SOLN
25.0000 mg | Freq: Once | INTRAMUSCULAR | Status: AC
  Administered 2016-04-11: 25 mg via INTRAVENOUS
  Filled 2016-04-11: qty 1

## 2016-04-11 MED ORDER — ONDANSETRON HCL 4 MG/2ML IJ SOLN
4.0000 mg | Freq: Once | INTRAMUSCULAR | Status: AC
  Administered 2016-04-11: 4 mg via INTRAVENOUS
  Filled 2016-04-11: qty 2

## 2016-04-11 MED ORDER — METHYLPREDNISOLONE SODIUM SUCC 125 MG IJ SOLR
125.0000 mg | Freq: Once | INTRAMUSCULAR | Status: AC
  Administered 2016-04-11: 125 mg via INTRAVENOUS
  Filled 2016-04-11: qty 2

## 2016-05-12 ENCOUNTER — Emergency Department (HOSPITAL_COMMUNITY)
Admission: EM | Admit: 2016-05-12 | Discharge: 2016-05-13 | Disposition: A | Discharge status: Home / Self Care | Payer: 59 | Attending: Emergency Medicine | Admitting: Emergency Medicine

## 2016-05-12 ENCOUNTER — Encounter (HOSPITAL_COMMUNITY): Payer: Self-pay

## 2016-05-12 DIAGNOSIS — J45909 Unspecified asthma, uncomplicated: Secondary | ICD-10-CM | POA: Insufficient documentation

## 2016-05-12 DIAGNOSIS — W01198A Fall on same level from slipping, tripping and stumbling with subsequent striking against other object, initial encounter: Secondary | ICD-10-CM | POA: Insufficient documentation

## 2016-05-12 DIAGNOSIS — Y939 Activity, unspecified: Secondary | ICD-10-CM | POA: Diagnosis not present

## 2016-05-12 DIAGNOSIS — S0990XA Unspecified injury of head, initial encounter: Secondary | ICD-10-CM | POA: Diagnosis not present

## 2016-05-12 DIAGNOSIS — Y999 Unspecified external cause status: Secondary | ICD-10-CM | POA: Insufficient documentation

## 2016-05-12 DIAGNOSIS — S0181XA Laceration without foreign body of other part of head, initial encounter: Secondary | ICD-10-CM | POA: Diagnosis not present

## 2016-05-12 DIAGNOSIS — Y929 Unspecified place or not applicable: Secondary | ICD-10-CM | POA: Insufficient documentation

## 2016-05-12 MED ORDER — OXYCODONE-ACETAMINOPHEN 5-325 MG PO TABS
1.0000 | ORAL_TABLET | ORAL | Status: AC | PRN
  Administered 2016-05-12 – 2016-05-13 (×2): 1 via ORAL
  Filled 2016-05-12: qty 1

## 2016-05-12 MED ORDER — OXYCODONE-ACETAMINOPHEN 5-325 MG PO TABS
ORAL_TABLET | ORAL | Status: AC
  Administered 2016-05-13: 1 via ORAL
  Filled 2016-05-12: qty 1

## 2016-05-12 NOTE — ED Triage Notes (Signed)
Pt reports she tripped and fell hitting her head on brick steps, she reports LOC after hitting her head. 2 inch laceration noted to forehead.  Pt is alert and oriented on arrival vitals stable.

## 2016-05-13 ENCOUNTER — Emergency Department (HOSPITAL_COMMUNITY): Payer: 59

## 2016-05-13 DIAGNOSIS — R51 Headache: Secondary | ICD-10-CM | POA: Diagnosis not present

## 2016-05-13 DIAGNOSIS — S0181XA Laceration without foreign body of other part of head, initial encounter: Secondary | ICD-10-CM | POA: Diagnosis not present

## 2016-05-13 DIAGNOSIS — J45909 Unspecified asthma, uncomplicated: Secondary | ICD-10-CM | POA: Diagnosis not present

## 2016-05-13 MED ORDER — IBUPROFEN 800 MG PO TABS: 800.0000 mg | ORAL_TABLET | Freq: Three times a day (TID) | ORAL | 0 refills | Status: DC | PRN

## 2016-05-13 MED ORDER — LIDOCAINE HCL 2 % IJ SOLN
20.0000 mL | Freq: Once | INTRAMUSCULAR | Status: AC
  Administered 2016-05-13: 400 mg via INTRADERMAL
  Filled 2016-05-13: qty 20

## 2016-05-13 NOTE — Discharge Instructions (Signed)
Have sutures out in 5-7 days.  Return here as needed.  Follow-up with your primary care doctor.  Keep the area clean and dry.  Clean with soap and water

## 2016-05-13 NOTE — ED Provider Notes (Signed)
[facility] DEPT Provider Note   CSN: [id number] Arrival date & time: 05/12/16  1755     History   Chief Complaint Chief Complaint  Patient presents with  . Fall  . Head Injury    HPI Atiyana SHIMA [name] is a 30 y.o. female.  HPI Patient presents to the emergency department with a fall that occurred earlier.  Patient states she tripped and fell forward striking her left for him.  The patient has a laceration to the left ridge below the hairline date.  She is unsure if she lost consciousness, does not think she was unconscious for very long.  She did patient states that nothing seems to make the condition better, but palpation of the area makes the pain worse.  Patient states that she did not take any medications prior to arrival Past Medical History:  Diagnosis Date  . Asthma   . H/O: C-section   . Lipoma   . Lipoma of LUQ abdomen, s/p removal 06/27/2012 05/24/2012    Patient Active Problem List   Diagnosis Date Noted  . Status post repeat low transverse cesarean section 01/14/2015  . Lipoma of LUQ abdomen, s/p removal 06/27/2012 05/24/2012  . 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 Chlamydia infection 12/13/2011  . 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 abdmonial mass  2013  . BREAST SURGERY  12/30/11   breast reduction  . CESAREAN SECTION  09/09/2010   [place]  . CESAREAN SECTION N/A 01/13/2015   Procedure: CESAREAN SECTION;  Surgeon: Janyth Pupa, DO;  Location: [facility];  Service: Obstetrics;  Laterality: N/A;  EDD 01/18/15 would like [place] dressing    OB History    Gravida Para Term Preterm AB Living   2 2 2     2    SAB TAB Ectopic Multiple Live Births         0 1       Home Medications    Prior to Admission medications   Medication Sig Start Date End Date Taking? Authorizing Provider  albuterol (PROVENTIL HFA;VENTOLIN HFA) 108 (90 BASE) MCG/ACT inhaler Inhale 2 puffs into the lungs  every 6 (six) hours as needed for wheezing or shortness of breath.    Historical Provider, MD  diphenhydrAMINE (BENADRYL) 25 MG tablet Take 1 tablet (25 mg total) by mouth every 6 (six) hours as needed. 04/11/16   Charlann Lange, PA-C  famotidine (PEPCID) 20 MG tablet Take 1 tablet (20 mg total) by mouth 2 (two) times daily. 04/11/16   Charlann Lange, PA-C  Fluticasone-Salmeterol (ADVAIR) 250-50 MCG/DOSE AEPB Inhale 1 puff into the lungs daily.    Historical Provider, MD  predniSONE (DELTASONE) 20 MG tablet Take 3 tablets (60 mg total) by mouth daily. 04/11/16   Charlann Lange, PA-C  sertraline (ZOLOFT) 50 MG tablet Take 1 tablet (50 mg total) by mouth daily. Patient not taking: Reported on 04/11/2016 01/15/15   Janyth Pupa, DO    Family History Family History  Problem Relation Age of Onset  . Kidney disease Mother     kidney stones  . Cancer Maternal Grandmother     breast  . Cancer Maternal Grandfather   . Deep vein thrombosis Maternal Grandfather     in throat  . Cancer Maternal Aunt     breast  . Heart disease Maternal Uncle   . Stroke Maternal Uncle   . Diabetes Paternal Grandfather     Social History Social  History  Substance Use Topics  . Smoking status: Never Smoker  . Smokeless tobacco: Never Used  . Alcohol use No     Allergies   Erythromycin; Shellfish allergy; Asa buff (mag [buffered aspirin]; and Iodine   Review of Systems Review of Systems   Physical Exam Updated Vital Signs BP 108/62   Pulse 78   Temp 98.7 F (37.1 C) (Oral)   Resp 16   Ht 5\' 1"  (1.549 m)   Wt 106.6 kg   SpO2 99%   BMI 44.40 kg/m   Physical Exam  Constitutional: She is oriented to person, place, and time. She appears well-developed and well-nourished. No distress.  HENT:  Head: Normocephalic. Head is with laceration.    Mouth/Throat: Oropharynx is clear and moist.  Eyes: Pupils are equal, round, and reactive to light.  Neck: Normal range of motion. Neck supple.    Cardiovascular: Normal rate, regular rhythm and normal heart sounds.  Exam reveals no gallop and no friction rub.   No murmur heard. Pulmonary/Chest: Effort normal and breath sounds normal. No respiratory distress. She has no wheezes.  Neurological: She is alert and oriented to person, place, and time. She exhibits normal muscle tone. Coordination normal.  Skin: Skin is warm and dry. No rash noted. No erythema.  Psychiatric: She has a normal mood and affect. Her behavior is normal.  Nursing note and vitals reviewed.    ED Treatments / Results  Labs (all labs ordered are listed, but only abnormal results are displayed) Labs Reviewed - No data to display  EKG  EKG Interpretation None       Radiology Ct Head Wo Contrast  Result Date: 05/13/2016 CLINICAL DATA:  Status post fall. Headache. Laceration above left eye. EXAM: CT HEAD WITHOUT CONTRAST TECHNIQUE: Contiguous axial images were obtained from the base of the skull through the vertex without intravenous contrast. COMPARISON:  Brain MRI 05/23/2012 FINDINGS: Brain: No acute hemorrhage or evidence of acute infarct. No extra-axial collection. No mass effect. 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 Brain parenchyma and CSF-containing spaces are normal for age. Vascular: No hyperdense vessel or atherosclerotic calcification. Skull: The small laceration above the left orbit. Sinuses/Orbits: No sinus fluid levels or advanced mucosal thickening. No mastoid effusion. Normal orbits. IMPRESSION: 1. No acute intracranial abnormality. 2. Unchanged size, allowing for differences in technique, of anterior cranial fossa meningioma. Electronically Signed   By: Ulyses Jarred M.D.   On: 05/13/2016 01:08    Procedures Procedures (including critical care time)  Medications Ordered in ED Medications  oxyCODONE-acetaminophen (PERCOCET/ROXICET) 5-325 MG per  tablet 1 tablet (1 tablet Oral Given 05/13/16 0102)  lidocaine (XYLOCAINE) 2 % (with pres) injection 400 mg (400 mg Intradermal Given 05/13/16 0102)     Initial Impression / Assessment and Plan / ED Course  I have reviewed the triage vital signs and the nursing notes.  Pertinent labs & imaging results that were available during my care of the patient were reviewed by me and considered in my medical decision making (see chart for details).  Clinical Course    LACERATION REPAIR Performed by: Brent General Authorized by: Brent General Consent: Verbal consent obtained. Risks and benefits: risks, benefits and alternatives were discussed Consent given by: patient Patient identity confirmed: provided demographic data Prepped and Draped in normal sterile fashion Wound explored  Laceration Location: L forehead  Laceration Length: 4cm  No Foreign Bodies seen  or palpated  Anesthesia: local infiltration  Local anesthetic: lidocaine 2% wo epinephrine  Anesthetic total: 10 ml  Irrigation method: syringe Amount of cleaning: standard  Skin closure: 6-0 Prolene and 5-0 Vicryl  Number of sutures: 8 dermal sutures and 2 subcutaneous  Technique: simple interrupted  Patient tolerance: Patient tolerated the procedure well with no immediate complications.   Final Clinical Impressions(s) / ED Diagnoses   Final diagnoses:  None  Patient's head CT CT was negative.  The patient does not have any cervical spine tenderness.  Told to return here as needed.  Patient agrees the plan and all questions were answered.  Advised to have sutures out in 5-7 days  New Prescriptions New Prescriptions   No medications on file     Dalia Heading, PA-C 05/13/16 0148    Julianne Rice, MD 05/13/16 [phone]

## 2016-05-21 DIAGNOSIS — T148 Other injury of unspecified body region: Secondary | ICD-10-CM | POA: Diagnosis not present

## 2016-06-17 ENCOUNTER — Other Ambulatory Visit (HOSPITAL_COMMUNITY): Payer: Self-pay | Admitting: Gastroenterology

## 2016-06-17 DIAGNOSIS — K625 Hemorrhage of anus and rectum: Secondary | ICD-10-CM | POA: Diagnosis not present

## 2016-06-17 DIAGNOSIS — R1011 Right upper quadrant pain: Secondary | ICD-10-CM

## 2016-06-17 DIAGNOSIS — R110 Nausea: Secondary | ICD-10-CM

## 2016-06-17 DIAGNOSIS — K5900 Constipation, unspecified: Secondary | ICD-10-CM | POA: Diagnosis not present

## 2016-07-01 ENCOUNTER — Encounter (HOSPITAL_COMMUNITY): Admission: RE | Admit: 2016-07-01 | Payer: 59 | Source: Ambulatory Visit

## 2016-07-01 ENCOUNTER — Encounter (HOSPITAL_COMMUNITY): Payer: 59 | Attending: Gastroenterology

## 2016-07-12 DIAGNOSIS — R569 Unspecified convulsions: Secondary | ICD-10-CM | POA: Diagnosis not present

## 2016-07-13 ENCOUNTER — Emergency Department (HOSPITAL_COMMUNITY): Payer: 59

## 2016-07-13 ENCOUNTER — Encounter (HOSPITAL_COMMUNITY): Payer: Self-pay | Admitting: Emergency Medicine

## 2016-07-13 ENCOUNTER — Emergency Department (HOSPITAL_COMMUNITY)
Admission: EM | Admit: 2016-07-13 | Discharge: 2016-07-13 | Disposition: A | Discharge status: Home / Self Care | Payer: 59 | Attending: Emergency Medicine | Admitting: Emergency Medicine

## 2016-07-13 DIAGNOSIS — R51 Headache: Secondary | ICD-10-CM | POA: Insufficient documentation

## 2016-07-13 DIAGNOSIS — G40909 Epilepsy, unspecified, not intractable, without status epilepticus: Secondary | ICD-10-CM | POA: Diagnosis not present

## 2016-07-13 DIAGNOSIS — Z79899 Other long term (current) drug therapy: Secondary | ICD-10-CM | POA: Diagnosis not present

## 2016-07-13 DIAGNOSIS — R569 Unspecified convulsions: Secondary | ICD-10-CM

## 2016-07-13 DIAGNOSIS — J45909 Unspecified asthma, uncomplicated: Secondary | ICD-10-CM | POA: Diagnosis not present

## 2016-07-13 LAB — I-STAT BETA HCG BLOOD, ED (MC, WL, AP ONLY)

## 2016-07-13 LAB — URINE MICROSCOPIC-ADD ON: WBC UA: NONE SEEN WBC/hpf (ref 0–5)

## 2016-07-13 LAB — COMPREHENSIVE METABOLIC PANEL
ALT: 12 U/L — AB (ref 14–54)
AST: 16 U/L (ref 15–41)
Albumin: 3.6 g/dL (ref 3.5–5.0)
Alkaline Phosphatase: 70 U/L (ref 38–126)
Anion gap: 6 (ref 5–15)
BILIRUBIN TOTAL: 0.4 mg/dL (ref 0.3–1.2)
BUN: 8 mg/dL (ref 6–20)
CALCIUM: 8.9 mg/dL (ref 8.9–10.3)
CHLORIDE: 105 mmol/L (ref 101–111)
CO2: 27 mmol/L (ref 22–32)
CREATININE: 0.69 mg/dL (ref 0.44–1.00)
Glucose, Bld: 102 mg/dL — ABNORMAL HIGH (ref 65–99)
Potassium: 3.8 mmol/L (ref 3.5–5.1)
Sodium: 138 mmol/L (ref 135–145)
TOTAL PROTEIN: 6.5 g/dL (ref 6.5–8.1)

## 2016-07-13 LAB — URINALYSIS, ROUTINE W REFLEX MICROSCOPIC
BILIRUBIN URINE: NEGATIVE
GLUCOSE, UA: NEGATIVE mg/dL
KETONES UR: NEGATIVE mg/dL
Leukocytes, UA: NEGATIVE
Nitrite: NEGATIVE
PROTEIN: 100 mg/dL — AB
Specific Gravity, Urine: 1.022 (ref 1.005–1.030)
pH: 6 (ref 5.0–8.0)

## 2016-07-13 LAB — CBC WITH DIFFERENTIAL/PLATELET
Basophils Absolute: 0 10*3/uL (ref 0.0–0.1)
Basophils Relative: 0 %
EOS PCT: 1 %
Eosinophils Absolute: 0.1 10*3/uL (ref 0.0–0.7)
HCT: 38.1 % (ref 36.0–46.0)
Hemoglobin: 12.4 g/dL (ref 12.0–15.0)
LYMPHS ABS: 2.6 10*3/uL (ref 0.7–4.0)
LYMPHS PCT: 35 %
MCH: 29.8 pg (ref 26.0–34.0)
MCHC: 32.5 g/dL (ref 30.0–36.0)
MCV: 91.6 fL (ref 78.0–100.0)
MONO ABS: 0.4 10*3/uL (ref 0.1–1.0)
Monocytes Relative: 5 %
Neutro Abs: 4.3 10*3/uL (ref 1.7–7.7)
Neutrophils Relative %: 59 %
PLATELETS: 392 10*3/uL (ref 150–400)
RBC: 4.16 MIL/uL (ref 3.87–5.11)
RDW: 13.2 % (ref 11.5–15.5)
WBC: 7.3 10*3/uL (ref 4.0–10.5)

## 2016-07-13 LAB — RAPID URINE DRUG SCREEN, HOSP PERFORMED
Amphetamines: NOT DETECTED
BARBITURATES: NOT DETECTED
Benzodiazepines: NOT DETECTED
Cocaine: NOT DETECTED
Opiates: NOT DETECTED
Tetrahydrocannabinol: NOT DETECTED

## 2016-07-13 LAB — CBG MONITORING, ED: Glucose-Capillary: 98 mg/dL (ref 65–99)

## 2016-07-13 MED ORDER — LEVETIRACETAM 500 MG PO TABS: 500.0000 mg | ORAL_TABLET | Freq: Two times a day (BID) | ORAL | 1 refills | Status: DC

## 2016-07-13 MED ORDER — SODIUM CHLORIDE 0.9 % IV SOLN
1000.0000 mg | Freq: Once | INTRAVENOUS | Status: AC
  Administered 2016-07-13: 1000 mg via INTRAVENOUS
  Filled 2016-07-13: qty 10

## 2016-07-13 MED ORDER — ONDANSETRON HCL 4 MG/2ML IJ SOLN
4.0000 mg | Freq: Once | INTRAMUSCULAR | Status: AC
  Administered 2016-07-13: 4 mg via INTRAVENOUS
  Filled 2016-07-13: qty 2

## 2016-07-13 MED ORDER — ACETAMINOPHEN 500 MG PO TABS
1000.0000 mg | ORAL_TABLET | Freq: Once | ORAL | Status: AC
  Administered 2016-07-13: 1000 mg via ORAL
  Filled 2016-07-13: qty 2

## 2016-07-13 NOTE — ED Triage Notes (Signed)
Per EMS pt was found shaking by boyfriend called EMS pt was post-ictal for 5-10 mins, denies trauma or incontinence. Pt does have h/s of seizures not on seizure medications

## 2016-07-13 NOTE — ED Notes (Signed)
Pt went to the restroom in room but walked with no assistance. A little wobbly but I woke pt up to go to bathroom so I don't think she was fully awake.

## 2016-07-13 NOTE — ED Provider Notes (Signed)
By signing my name below, I, Dora Sims, attest that this documentation has been prepared under the direction and in the presence of physician practitioner, Delice Bison Glorine Hanratty, DO. Electronically Signed: Dora Sims, Scribe. 07/13/2016. 1:13 AM.  TIME SEEN: 1:13 AM  CHIEF COMPLAINT: Seizures  HPI: HPI Comments: Kendra [name] is a 30 y.o. female brought in by EMS, with PMHx significant for seizures, who presents to the Emergency Department complaining of seizure occurring shortly PTA. Pt states she does not remember anything that happened tonight. She states her boyfriend told her that she started seizing and fell face-first onto the floor. She denies biting her tongue or losing control of her bowel/bladder. Pt reports this is her third seizure; the previous two occurred within the last two months and she was not evaluated at a hospital after either. She has never been to a neurologist for seizures and does not use medication for seizures. Does not take any medication regularly. No history of drug abuse. She endorses fatigue, headache, and nausea currently. Pt does not use blood thinners. No recent alcohol or drug use. She denies fever, chills, numbness/tingling, weakness, visual changes, auditory changes, vomiting, or any other associated symptoms. Patient did have a head injury in September and had a negative head CT at this time. Has had some pain over the left forehead where she had a laceration that was repaired. No new head injury.   ROS: See HPI Constitutional: no fever  Eyes: no drainage  ENT: no runny nose   Cardiovascular:  no chest pain  Resp: no SOB  GI: no vomiting GU: no dysuria Integumentary: no rash  Allergy: no hives  Musculoskeletal: no leg swelling  Neurological: no slurred speech ROS otherwise negative  PAST MEDICAL HISTORY/PAST SURGICAL HISTORY:  Past Medical History:  Diagnosis Date  . Asthma   . H/O: C-section   . Lipoma   . Lipoma of LUQ abdomen, s/p removal  06/27/2012 05/24/2012    MEDICATIONS:  Prior to Admission medications   Medication Sig Start Date End Date Taking? Authorizing Provider  albuterol (PROVENTIL HFA;VENTOLIN HFA) 108 (90 BASE) MCG/ACT inhaler Inhale 2 puffs into the lungs every 6 (six) hours as needed for wheezing or shortness of breath.    Historical Provider, MD  diphenhydrAMINE (BENADRYL) 25 MG tablet Take 1 tablet (25 mg total) by mouth every 6 (six) hours as needed. 04/11/16   Charlann Lange, PA-C  famotidine (PEPCID) 20 MG tablet Take 1 tablet (20 mg total) by mouth 2 (two) times daily. 04/11/16   Charlann Lange, PA-C  Fluticasone-Salmeterol (ADVAIR) 250-50 MCG/DOSE AEPB Inhale 1 puff into the lungs daily.    Historical Provider, MD  ibuprofen (ADVIL,MOTRIN) 800 MG tablet Take 1 tablet (800 mg total) by mouth every 8 (eight) hours as needed. 05/13/16   Dalia Heading, PA-C  predniSONE (DELTASONE) 20 MG tablet Take 3 tablets (60 mg total) by mouth daily. 04/11/16   Charlann Lange, PA-C  sertraline (ZOLOFT) 50 MG tablet Take 1 tablet (50 mg total) by mouth daily. Patient not taking: Reported on 04/11/2016 01/15/15   Janyth Pupa, DO    ALLERGIES:  Allergies  Allergen Reactions  . Erythromycin Anaphylaxis  . Shellfish Allergy Anaphylaxis, Hives and Swelling    Pt throat swells  . Asa Buff (Mag [Buffered Aspirin] Swelling  . Iodine Hives and Swelling    SOCIAL HISTORY:  Social History  Substance Use Topics  . Smoking status: Never Smoker  . Smokeless tobacco: Never Used  . Alcohol use No  FAMILY HISTORY: Family History  Problem Relation Age of Onset  . Kidney disease Mother     kidney stones  . Cancer Maternal Grandmother     breast  . Cancer Maternal Grandfather   . Deep vein thrombosis Maternal Grandfather     in throat  . Cancer Maternal Aunt     breast  . Heart disease Maternal Uncle   . Stroke Maternal Uncle   . Diabetes Paternal Grandfather     EXAM: BP 112/81   Pulse 89   Temp 97.9 F (36.6  C) (Oral)   Resp 16   Ht 5\' 1"  (1.549 m)   Wt 215 lb (97.5 kg)   SpO2 94%   BMI 40.62 kg/m  CONSTITUTIONAL: Alert and oriented x3 and responds appropriately to questions. Well-appearing; well-nourished HEAD: Normocephalic; atraumatic EYES: Conjunctivae clear, PERRL, EOMI ENT: normal nose; no rhinorrhea; moist mucous membranes NECK: Supple, no meningismus, no nuchal rigidity, no LAD; no midline spinal tenderness, step off, or deformity CARD: RRR; S1 and S2 appreciated; no murmurs, no clicks, no rubs, no gallops RESP: Normal chest excursion without splinting or tachypnea; breath sounds clear and equal bilaterally; no wheezes, no rhonchi, no rales, no hypoxia or respiratory distress, speaking full sentences ABD/GI: Normal bowel sounds; non-distended; soft, non-tender, no rebound, no guarding, no peritoneal signs, no hepatosplenomegaly BACK:  The back appears normal and is non-tender to palpation, there is no CVA tenderness; no midline spinal tenderness, step off, or deformity EXT: Normal ROM in all joints; non-tender to palpation; no edema; normal capillary refill; no cyanosis, no calf tenderness or swelling    SKIN: Normal color for age and race; warm; no rash NEURO: Moves all extremities equally, sensation to light touch intact diffusely, cranial nerves II through XII intact, normal speech PSYCH: The patient's mood and manner are appropriate. Grooming and personal hygiene are appropriate.  MEDICAL DECISION MAKING: Patient here with seizure. This is her third seizure. She is currently neurologically intact and hemodynamically stable. No sign of trauma on exam.  Will obtain labs, urine and give her IV Keppra. Given this is her third seizure feel she needs to be started on Keppra 500 mg twice a day and follow-up with neurology as an outpatient. She is comfortable with this plan. I have advised her that she cannot drive until she has been cleared by a neurologist.   Boyfriend at bedside and  describes seizures tonic-clonic lasting for 1-3 minutes.  ED PROGRESS: Patient's workup has been unremarkable. She is not pregnant. Urine shows blood but she is on her menstrual cycle. Urine drug screen negative. She denies alcohol or benzodiazepine use. She is not on Wellbutrin, tramadol or other medications that may lower her seizure threshold. Repeat head CT shows no acute pathology. She has a stable appearing anterior cranial fossa meningioma that she was aware of. Have given her outpatient neurology follow-up information. She is still neurologically intact. Limited with IV Keppra 1000 mg in the ED and will be discharge with prescription for 500 twice daily. Have again advised her and her boyfriend that she should not drive or perform any activity that may be dangerous to herself or others if she were to have a seizure until she has been cleared by neurology. She verbalized understanding. Discussed return precautions.   At this time, I do not feel there is any life-threatening condition present. I have reviewed and discussed all results (EKG, imaging, lab, urine as appropriate) and exam findings with patient/family. I have reviewed nursing notes  and appropriate previous records.  I feel the patient is safe to be discharged home without further emergent workup and can continue workup as an outpatient as needed. Discussed usual and customary return precautions. Patient/family verbalize understanding and are comfortable with this plan.  Outpatient follow-up has been provided. All questions have been answered.    I personally performed the services described in this documentation, which was scribed in my presence. The recorded information has been reviewed and is accurate.    Magnet, DO 07/13/16 [phone]

## 2016-07-13 NOTE — Discharge Instructions (Signed)
You should not drive until you have been cleared by a neurologist. Please call neurology to schedule an outpatient appointment. Please take your Keppra twice a day.

## 2016-07-13 NOTE — ED Notes (Signed)
Patient transported to CT 

## 2016-07-15 DIAGNOSIS — K219 Gastro-esophageal reflux disease without esophagitis: Secondary | ICD-10-CM | POA: Diagnosis not present

## 2016-07-15 DIAGNOSIS — K5904 Chronic idiopathic constipation: Secondary | ICD-10-CM | POA: Diagnosis not present

## 2016-07-17 ENCOUNTER — Encounter (HOSPITAL_COMMUNITY): Payer: Self-pay | Admitting: *Deleted

## 2016-07-17 ENCOUNTER — Emergency Department (HOSPITAL_COMMUNITY)
Admission: EM | Admit: 2016-07-17 | Discharge: 2016-07-17 | Disposition: A | Discharge status: Home / Self Care | Payer: 59 | Attending: Physician Assistant | Admitting: Physician Assistant

## 2016-07-17 DIAGNOSIS — J45909 Unspecified asthma, uncomplicated: Secondary | ICD-10-CM | POA: Diagnosis not present

## 2016-07-17 DIAGNOSIS — T7840XA Allergy, unspecified, initial encounter: Secondary | ICD-10-CM | POA: Diagnosis not present

## 2016-07-17 MED ORDER — ONDANSETRON HCL 4 MG/2ML IJ SOLN
4.0000 mg | Freq: Once | INTRAMUSCULAR | Status: AC
  Administered 2016-07-17: 4 mg via INTRAVENOUS
  Filled 2016-07-17: qty 2

## 2016-07-17 MED ORDER — DIPHENHYDRAMINE HCL 25 MG PO CAPS: 25.0000 mg | ORAL_CAPSULE | Freq: Four times a day (QID) | ORAL | 0 refills | Status: AC | PRN

## 2016-07-17 MED ORDER — PREDNISONE 20 MG PO TABS: ORAL_TABLET | ORAL | 0 refills | Status: DC

## 2016-07-17 MED ORDER — METHYLPREDNISOLONE SODIUM SUCC 125 MG IJ SOLR
125.0000 mg | Freq: Once | INTRAMUSCULAR | Status: AC
  Administered 2016-07-17: 125 mg via INTRAVENOUS
  Filled 2016-07-17: qty 2

## 2016-07-17 MED ORDER — SODIUM CHLORIDE 0.9 % IV BOLUS (SEPSIS)
1000.0000 mL | Freq: Once | INTRAVENOUS | Status: AC
  Administered 2016-07-17: 1000 mL via INTRAVENOUS

## 2016-07-17 NOTE — ED Triage Notes (Signed)
Pt reports having allergic reaction that started this am, unsure of what her reaction is to. Has hives to entire body and reports feeling like her throat is swelling. Took benadryl pta but did not use her epi pen.

## 2016-07-17 NOTE — Discharge Instructions (Signed)
Please return if you have worsening symptoms or any concerns.

## 2016-07-17 NOTE — ED Provider Notes (Signed)
[facility] DEPT Provider Note   CSN: [id number] Arrival date & time: 07/17/16  1400     History   Chief Complaint Chief Complaint  Patient presents with  . Allergic Reaction    HPI Margarett KLIYAH SHERIN is a 30 y.o. female.  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 Patient reports this started at 9 AM. She reports this happened several times the past where which she develops hives, itchiness. Patient says she ate her normal omelet this morning. She vomited her lunch. She reports low-grade allergic symptoms. She reports they are improving now.  She had no shortness of breath, swelling of her face or throat. She reports mild itchiness to her skin with hives like rash. This has since resolved. Patient took unknown amount of Benadryl. She reports that she brings liquid Benadryl with her to work everyday in case is happens and she took several slugs of it.  Past Medical History:  Diagnosis Date  . Asthma   . H/O: C-section   . Lipoma   . Lipoma of LUQ abdomen, s/p removal 06/27/2012 05/24/2012    Patient Active Problem List   Diagnosis Date Noted  . Status post repeat low transverse cesarean section 01/14/2015  . Lipoma of LUQ abdomen, s/p removal 06/27/2012 05/24/2012  . 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 Chlamydia infection 12/13/2011  . 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 abdmonial mass  2013  . BREAST SURGERY  12/30/11   breast reduction  . CESAREAN SECTION  09/09/2010   [place]  . CESAREAN SECTION N/A 01/13/2015   Procedure: CESAREAN SECTION;  Surgeon: Janyth Pupa, DO;  Location: [facility];  Service: Obstetrics;  Laterality: N/A;  EDD 01/18/15 would like [place]ico dressing    OB History    Gravida Para Term Preterm AB Living   2 2 2     2    SAB TAB Ectopic Multiple Live Births         0 1       Home  Medications    Prior to Admission medications   Medication Sig Start Date End Date Taking? Authorizing Provider  albuterol (PROVENTIL HFA;VENTOLIN HFA) 108 (90 BASE) MCG/ACT inhaler Inhale 2 puffs into the lungs every 6 (six) hours as needed for wheezing or shortness of breath.    Historical Provider, MD  Cyanocobalamin (VITAMIN B-12 PO) Take 1 tablet by mouth daily.    Historical Provider, MD  Fluticasone-Salmeterol (ADVAIR) 250-50 MCG/DOSE AEPB Inhale 1 puff into the lungs daily.    Historical Provider, MD  ibuprofen (ADVIL,MOTRIN) 800 MG tablet Take 1 tablet (800 mg total) by mouth every 8 (eight) hours as needed. Patient taking differently: Take 800 mg by mouth every 8 (eight) hours as needed for moderate pain.  05/13/16   Dalia Heading, PA-C  levETIRAcetam (KEPPRA) 500 MG tablet Take 1 tablet (500 mg total) by mouth 2 (two) times daily. 07/13/16   Delice Bison Ward, DO    Family History Family History  Problem Relation Age of Onset  . Kidney disease Mother     kidney stones  . Cancer Maternal Grandmother     breast  . Cancer Maternal Grandfather   . Deep vein thrombosis Maternal Grandfather     in throat  . Cancer Maternal Aunt     breast  . Heart disease Maternal Uncle   .  Stroke Maternal Uncle   . Diabetes Paternal Grandfather     Social History Social History  Substance Use Topics  . Smoking status: Never Smoker  . Smokeless tobacco: Never Used  . Alcohol use No     Allergies   Erythromycin; Shellfish allergy; Asa buff (mag [buffered aspirin]; and Iodine   Review of Systems Review of Systems  Constitutional: Negative for fatigue and fever.  Gastrointestinal: Positive for nausea and vomiting. Negative for abdominal pain.  Skin: Positive for rash.  All other systems reviewed and are negative.    Physical Exam Updated Vital Signs BP 107/63 (BP Location: Right Arm)   Pulse 106   Temp 98.1 F (36.7 C) (Oral)   Resp 22   Wt 215 lb (97.5 kg)   SpO2 97%    BMI 40.62 kg/m   Physical Exam  Constitutional: She is oriented to person, place, and time. She appears well-developed and well-nourished.  HENT:  Head: Normocephalic and atraumatic.  No swelling  Eyes: Right eye exhibits no discharge.  Cardiovascular: Regular rhythm and normal heart sounds.   No murmur heard. tachycardia  Pulmonary/Chest: Effort normal and breath sounds normal. She has no wheezes. She has no rales.  Abdominal: Soft. She exhibits no distension. There is no tenderness.  Neurological: She is oriented to person, place, and time.  Skin: Skin is warm and dry. She is not diaphoretic.  No visible rash   Psychiatric: She has a normal mood and affect.  Nursing note and vitals reviewed.    ED Treatments / Results  Labs (all labs ordered are listed, but only abnormal results are displayed) Labs Reviewed - No data to display  EKG  EKG Interpretation None       Radiology No results found.  Procedures Procedures (including critical care time)  Medications Ordered in ED Medications - No data to display   Initial Impression / Assessment and Plan / ED Course  I have reviewed the triage vital signs and the nursing notes.  Pertinent labs & imaging results that were available during my care of the patient were reviewed by me and considered in my medical decision making (see chart for details).  Clinical Course    Patient is a 30 year old female with history of allergic reactions presenting today with prolonged subtle allergic reaction. This started at 9 AM this morning (its 3 pm). She reports mild hives, no edema or shortness of breath. In on arrival here patient has normal physical exam. We will give her steroids observe her and likely be able to discharge home with steroid taper and continued Benadryl.  Obsed for 2 hours, no worsening of symtpoms, perscriptions and return precuations given.   Final Clinical Impressions(s) / ED Diagnoses   Final diagnoses:    None    New Prescriptions New Prescriptions   No medications on file     Cortne Amara Julio Alm, MD 07/17/16 [phone]

## 2016-07-19 ENCOUNTER — Other Ambulatory Visit (HOSPITAL_COMMUNITY)
Admission: RE | Admit: 2016-07-19 | Discharge: 2016-07-19 | Disposition: A | Discharge status: Home / Self Care | Payer: 59 | Source: Ambulatory Visit | Attending: Obstetrics and Gynecology | Admitting: Obstetrics and Gynecology

## 2016-07-19 ENCOUNTER — Other Ambulatory Visit: Payer: Self-pay | Admitting: Obstetrics & Gynecology

## 2016-07-19 DIAGNOSIS — Z975 Presence of (intrauterine) contraceptive device: Secondary | ICD-10-CM | POA: Diagnosis not present

## 2016-07-19 DIAGNOSIS — Z01419 Encounter for gynecological examination (general) (routine) without abnormal findings: Secondary | ICD-10-CM | POA: Insufficient documentation

## 2016-07-19 DIAGNOSIS — Z6841 Body Mass Index (BMI) 40.0 and over, adult: Secondary | ICD-10-CM | POA: Diagnosis not present

## 2016-07-20 LAB — CYTOLOGY - PAP: DIAGNOSIS: NEGATIVE

## 2016-07-27 ENCOUNTER — Ambulatory Visit (INDEPENDENT_AMBULATORY_CARE_PROVIDER_SITE_OTHER): Payer: 59 | Admitting: Neurology

## 2016-07-27 ENCOUNTER — Encounter: Payer: Self-pay | Admitting: Neurology

## 2016-07-27 VITALS — BP 116/74 | HR 84 | Ht 61.0 in | Wt 218.0 lb

## 2016-07-27 DIAGNOSIS — D329 Benign neoplasm of meninges, unspecified: Secondary | ICD-10-CM | POA: Diagnosis not present

## 2016-07-27 DIAGNOSIS — G43019 Migraine without aura, intractable, without status migrainosus: Secondary | ICD-10-CM | POA: Diagnosis not present

## 2016-07-27 DIAGNOSIS — R569 Unspecified convulsions: Secondary | ICD-10-CM

## 2016-07-27 HISTORY — DX: Benign neoplasm of meninges, unspecified: D32.9

## 2016-07-27 HISTORY — PX: BRAIN MENINGIOMA EXCISION: SHX576

## 2016-07-27 HISTORY — DX: Unspecified convulsions: R56.9

## 2016-07-27 MED ORDER — ALPRAZOLAM 0.5 MG PO TABS: ORAL_TABLET | ORAL | 0 refills | Status: DC

## 2016-07-27 NOTE — Patient Instructions (Signed)
   We will check MRI of the brain and get an EEG study. 

## 2016-07-27 NOTE — Progress Notes (Signed)
Reason for visit: Seizures  Referring physician: [place]  Kendra [name] is a 30 y.o. female  History of present illness:  Kendra [name] is a 30 year old right-handed black female with a history of at least 2 or 3 blackout events within her lifetime, the first event occurred around age 45. The patient had a witnessed seizure event that occurred on 07/13/2016. The boyfriend heard her fall to the floor, and she was jerking on all fours when he saw her. The patient woke up in the ambulance going to the hospital, she had bitten her tongue and was incontinent of the bowels and the bladder. The patient underwent a CT scan of the head that showed a meningioma in the anterior fossa that have been documented previously. The patient reports a two-month history of headaches in the left side of the head going back to the posterior aspects of the head including the neck. The patient has some photophobia, she may have some scalp tenderness and pounding sensations when she stoops or bends over. The patient reports no numbness or weakness of the face, arms, or legs. She denies issues controlling the bowels or the bladder otherwise. There is no family history of seizures. The patient was placed on Keppra in the emergency room. Urine drug screen was negative. The patient is sent to this office for an evaluation. She works in housekeeping at [employer].  Past Medical History:  Diagnosis Date  . Asthma   . Convulsions/seizures ([place]) 07/27/2016  . H/O: C-section   . Lipoma   . Lipoma of LUQ abdomen, s/p removal 06/27/2012 05/24/2012  . Meningioma ([place]) 07/27/2016   Anterior fossa    Past Surgical History:  Procedure Laterality Date  . BREAST SURGERY  12/30/11   breast reduction  . CESAREAN SECTION  09/09/2010   [place]  . CESAREAN SECTION N/A 01/13/2015   Procedure: CESAREAN SECTION;  Surgeon: Janyth Pupa, DO;  Location: [facility];  Service: Obstetrics;  Laterality: N/A;  EDD 01/18/15 would like [place] dressing  .  LYMPH NODE DISSECTION  2013   Abdominal    Family History  Problem Relation Age of Onset  . Kidney disease Mother     kidney stones  . Cancer Maternal Grandmother     breast  . Cancer Maternal Grandfather     Colon  . Deep vein thrombosis Maternal Grandfather     in throat  . Cancer Maternal Aunt     breast  . Heart disease Maternal Uncle   . Stroke Maternal Uncle   . Liver cancer Maternal Uncle   . Diabetes Paternal Grandfather     Social history:  reports that she has never smoked. She has never used smokeless tobacco. She reports that she does not drink alcohol or use drugs.  Medications:  Prior to Admission medications   Medication Sig Start Date End Date Taking? Authorizing Provider  albuterol (PROVENTIL HFA;VENTOLIN HFA) 108 (90 BASE) MCG/ACT inhaler Inhale 2 puffs into the lungs every 6 (six) hours as needed for wheezing or shortness of breath.   Yes Historical Provider, MD  Cyanocobalamin (VITAMIN B-12 PO) Take 1 tablet by mouth daily.   Yes Historical Provider, MD  diphenhydrAMINE (BENADRYL) 25 mg capsule Take 1 capsule (25 mg total) by mouth every 6 (six) hours as needed. 07/17/16  Yes Courteney Lyn Mackuen, MD  Fluticasone-Salmeterol (ADVAIR) 250-50 MCG/DOSE AEPB Inhale 1 puff into the lungs daily.   Yes Historical Provider, MD  ibuprofen (ADVIL,MOTRIN) 800 MG tablet Take  1 tablet (800 mg total) by mouth every 8 (eight) hours as needed. 05/13/16  Yes Christopher Lawyer, PA-C  levETIRAcetam (KEPPRA) 500 MG tablet Take 1 tablet (500 mg total) by mouth 2 (two) times daily. 07/13/16  Yes Kristen N Ward, DO  levonorgestrel (MIRENA) 20 MCG/24HR IUD 1 each by Intrauterine route once.   Yes Historical Provider, MD  LINZESS 290 MCG CAPS capsule Take 290 mcg by mouth daily. 07/16/16  Yes Historical Provider, MD  omeprazole (PRILOSEC) 40 MG capsule Take 40 mg by mouth daily. 07/15/16  Yes Historical Provider, MD  predniSONE (DELTASONE) 20 MG tablet Day 1 and 2: Take 3 tabs  Day  3-5: Take 2 tabs.  Day 5-8: take 1 tab 07/17/16  Yes Courteney Lyn Mackuen, MD  traMADol (ULTRAM) 50 MG tablet TK 1 T PO Q 6 H PRF PAIN 05/21/16  Yes Historical Provider, MD      Allergies  Allergen Reactions  . Erythromycin Anaphylaxis  . Shellfish Allergy Anaphylaxis, Hives, Swelling and Other (See Comments)    Pt throat swells  . Asa Buff (Mag [Buffered Aspirin] Swelling  . Iodine Hives and Swelling  . Peanut-Containing Drug Products Hives and Swelling    Pt is allergic to peanut oil    ROS:  Out of a complete 14 system review of symptoms, the patient complains only of the following symptoms, and all other reviewed systems are negative.  Blurred vision Wheezing Feeling hot Headache, dizziness, seizures, passing out Insomnia  Blood pressure 116/74, pulse 84, height 5\' 1"  (1.549 m), weight 218 lb (98.9 kg), unknown if currently breastfeeding.  Physical Exam  General: The patient is alert and cooperative at the time of the examination. The patient is markedly obese.  Eyes: Pupils are equal, round, and reactive to light. Discs are flat bilaterally.  Neck: The neck is supple, no carotid bruits are noted.  Respiratory: The respiratory examination is clear.  Cardiovascular: The cardiovascular examination reveals a regular rate and rhythm, no obvious murmurs or rubs are noted.  Skin: Extremities are without significant edema.  Neurologic Exam  Mental status: The patient is alert and oriented x 3 at the time of the examination. The patient has apparent normal recent and remote memory, with an apparently normal attention span and concentration ability.  Cranial nerves: Facial symmetry is present. There is good sensation of the face to pinprick and soft touch bilaterally. The strength of the facial muscles and the muscles to head turning and shoulder shrug are normal bilaterally. Speech is well enunciated, no aphasia or dysarthria is noted. Extraocular movements are full. Visual  fields are full. The tongue is midline, and the patient has symmetric elevation of the soft palate. No obvious hearing deficits are noted.  Motor: The motor testing reveals 5 over 5 strength of all 4 extremities. Good symmetric motor tone is noted throughout.  Sensory: Sensory testing is intact to pinprick, soft touch, vibration sensation, and position sense on all 4 extremities. No evidence of extinction is noted.  Coordination: Cerebellar testing reveals good finger-nose-finger and heel-to-shin bilaterally.  Gait and station: Gait is normal. Tandem gait is normal. Romberg is negative. No drift is seen.  Reflexes: Deep tendon reflexes are symmetric and normal bilaterally. Toes are downgoing bilaterally.   CT head 07/13/16:  IMPRESSION: 1. No acute intracranial pathology seen on CT. 2. Relatively stable appearance to peripherally calcified anterior cranial fossa meningioma, measuring 2.8 x 1.5 x 2.4 cm.  * CT scan images were reviewed online. I agree with  the written report.    Assessment/Plan:  1. Anterior fossa meningioma  2. Seizure event  3. Common migraine headache  4. Morbid obesity  The patient likely has seizures secondary to the meningioma. The patient will be sent for MRI evaluation of the brain with and without gadolinium enhancement. The patient will have an EEG study. She will continue the [place] for now, she is not to operate a motor vehicle for at least 6 months from the time of the last seizure. She will follow-up through this office in 4 months.  Jill Alexanders MD 07/27/2016 2:23 PM  [facility] [address] [place], [place] [address]  Phone [phone] Fax [phone]

## 2016-08-03 ENCOUNTER — Encounter (HOSPITAL_COMMUNITY)
Admission: RE | Admit: 2016-08-03 | Discharge: 2016-08-03 | Disposition: A | Discharge status: Home / Self Care | Payer: 59 | Source: Ambulatory Visit | Attending: Gastroenterology | Admitting: Gastroenterology

## 2016-08-03 ENCOUNTER — Ambulatory Visit (HOSPITAL_COMMUNITY)
Admission: RE | Admit: 2016-08-03 | Discharge: 2016-08-03 | Disposition: A | Discharge status: Home / Self Care | Payer: 59 | Source: Ambulatory Visit | Attending: Gastroenterology | Admitting: Gastroenterology

## 2016-08-03 DIAGNOSIS — R932 Abnormal findings on diagnostic imaging of liver and biliary tract: Secondary | ICD-10-CM | POA: Diagnosis not present

## 2016-08-03 DIAGNOSIS — R110 Nausea: Secondary | ICD-10-CM | POA: Insufficient documentation

## 2016-08-03 DIAGNOSIS — R1011 Right upper quadrant pain: Secondary | ICD-10-CM | POA: Insufficient documentation

## 2016-08-03 MED ORDER — TECHNETIUM TC 99M MEBROFENIN IV KIT
5.5000 | PACK | Freq: Once | INTRAVENOUS | Status: AC | PRN
  Administered 2016-08-03: 5.5 via INTRAVENOUS

## 2016-08-07 ENCOUNTER — Other Ambulatory Visit: Payer: 59

## 2016-08-18 ENCOUNTER — Ambulatory Visit: Payer: 59 | Admitting: Neurology

## 2016-08-20 ENCOUNTER — Ambulatory Visit
Admission: RE | Admit: 2016-08-20 | Discharge: 2016-08-20 | Disposition: A | Discharge status: Home / Self Care | Payer: 59 | Source: Ambulatory Visit | Attending: Neurology | Admitting: Neurology

## 2016-08-20 DIAGNOSIS — D329 Benign neoplasm of meninges, unspecified: Secondary | ICD-10-CM | POA: Diagnosis not present

## 2016-08-20 DIAGNOSIS — D320 Benign neoplasm of cerebral meninges: Secondary | ICD-10-CM | POA: Diagnosis not present

## 2016-08-20 DIAGNOSIS — R569 Unspecified convulsions: Secondary | ICD-10-CM | POA: Diagnosis not present

## 2016-08-20 MED ORDER — GADOBENATE DIMEGLUMINE 529 MG/ML IV SOLN
20.0000 mL | Freq: Once | INTRAVENOUS | Status: AC | PRN
  Administered 2016-08-20: 20 mL via INTRAVENOUS

## 2016-08-24 ENCOUNTER — Telehealth: Payer: Self-pay | Admitting: Neurology

## 2016-08-24 DIAGNOSIS — D329 Benign neoplasm of meninges, unspecified: Secondary | ICD-10-CM

## 2016-08-24 NOTE — Telephone Encounter (Signed)
I called the patient. MRI of the brain shows an enlarging meningioma, will make a neurosurgical referral. The patient is in agreement.   MRI brain 08/20/16:  IMPRESSION:  This MRI of the brain with and without contrast shows the following: 1.    302416 mm homogenously enhancing mass consistent with a cribriform meningioma. When compared to the previous MRI dated 05/23/2012, there has been significant enlargement. Additionally, now there is mass effect in the right inferior frontal lobe. 2.    Brain parenchyma is otherwise normal.    There are no acute findings.

## 2016-09-13 ENCOUNTER — Other Ambulatory Visit: Payer: 59

## 2016-09-13 DIAGNOSIS — Z6841 Body Mass Index (BMI) 40.0 and over, adult: Secondary | ICD-10-CM | POA: Diagnosis not present

## 2016-09-13 DIAGNOSIS — D329 Benign neoplasm of meninges, unspecified: Secondary | ICD-10-CM | POA: Diagnosis not present

## 2016-09-27 ENCOUNTER — Ambulatory Visit: Payer: Self-pay | Admitting: Surgery

## 2016-09-27 DIAGNOSIS — K811 Chronic cholecystitis: Secondary | ICD-10-CM | POA: Diagnosis not present

## 2016-09-27 NOTE — H&P (Addendum)
History of Present Illness Kendra [name]. Kendra Gudger MD; 09/27/2016 10:21 AM) The patient is a 31 year old female who presents with abdominal pain. PCP-Dr. Kelton Pillar GI-Dr. [facility] Neurosurgery - Dr. Kathyrn Sheriff Neurology-Dr. Jannifer Franklin  Reason for evaluation - gallbladder disease  This is a 31 year old female who presents with a two-year history of some digestive issues. The patient develops some postprandial pain in her right upper quadrant associated with abdominal bloating as well as nausea and vomiting. This tends to occur when she has spicy or greasy foods. She also has had other issues with her bowel movements. She reports that she has seen some blood in her bowel movements. She does occasionally get some constipation. She was referred to GI for evaluation. It does not appear that an EGD or a colonoscopy have been performed. The patient had ultrasound and HIDA scan that were both negative. However the HIDA scan did show a fairly low, but normal gallbladder ejection fraction. The patient did develop nausea after ingesting the Ensure. Because of her symptoms, she is referred to discuss possible gallbladder surgery.  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 She has been seen by neurosurgery who recommends resection of the mass. However she would like to have her gallbladder removed first. The patient has had seizures. These seem to be well controlled on Keppra.  CLINICAL DATA: Right upper quadrant pain for the past year associated with nausea vomiting diarrhea and acid reflux symptoms.  EXAM: ABDOMEN ULTRASOUND COMPLETE  COMPARISON: Abdominal ultrasound of May 05, 2012  FINDINGS: Gallbladder: No gallstones or wall thickening visualized. No sonographic Murphy sign noted by sonographer.  Common bile duct: Diameter: 2.9 mm  Liver: The hepatic echotexture is normal. In the left hepatic lobe there is a hyper dense  well-circumscribed solid mass measuring 2.5 x 2.3 x 3.1 cm. This has increased in size since the September 2013 study when the maximal dimension or 1.6 x 1.4 x 1.8 cm.  IVC: No abnormality visualized.  Pancreas: Visualized portion unremarkable.  Spleen: Size and appearance within normal limits.  Right Kidney: Length: 10.8 cm. Echogenicity within normal limits. No mass or hydronephrosis visualized.  Left Kidney: Length: 10.3 cm. Echogenicity within normal limits. No mass or hydronephrosis visualized.  Abdominal aorta: No aneurysm visualized.  Other findings: There is no ascites.  IMPRESSION: Hyperechoic well-marginated focus in the left hepatic lobe which has increased in size since 2013 most compatible with a hemangioma.  No gallstones or sonographic evidence of acute cholecystitis. If there are clinical concerns of chronic gallbladder dysfunction, a nuclear medicine hepatobiliary scan may be useful.   Electronically Signed By: David Martinique M.D. On: 08/03/2016 08:31  CLINICAL DATA: Right upper quadrant pain with nausea and vomiting episodes for the past year.  EXAM: NUCLEAR MEDICINE HEPATOBILIARY IMAGING WITH GALLBLADDER EF  TECHNIQUE: Sequential images of the abdomen were obtained out to 60 minutes following intravenous administration of radiopharmaceutical. After oral ingestion of Ensure, gallbladder ejection fraction was determined. At 60 min, normal ejection fraction is greater than 33%.  RADIOPHARMACEUTICALS: 5.5 mCi Tc-37m Choletec IV  COMPARISON: Abdominal ultrasound of today's date  FINDINGS: Prompt uptake and biliary excretion of activity by the liver is seen. Gallbladder activity is visualized, consistent with patency of cystic duct. Biliary activity passes into small bowel, consistent with patent common bile duct.  Calculated gallbladder ejection fraction is 42%. (Normal gallbladder ejection fraction with Ensure is greater than 33%.) the  patient experienced nausea after drinking Ensure.  IMPRESSION: No evidence of cystic duct or common bile duct obstruction. Gallbladder ejection fraction value is toward the lower range of normal but still within the limits of normal.   Electronically Signed By: David Martinique M.D. On: 08/03/2016 13:47   Past Surgical History Kendra [name], [place]; 09/27/2016 9:36 AM) Cesarean Section - Multiple Mammoplasty; Reduction Bilateral.  Diagnostic Studies History Kendra [name], [place]; 09/27/2016 9:36 AM) Colonoscopy never Mammogram never Pap Smear 1-5 years ago  Allergies Kendra [name], RMA; 09/27/2016 9:40 AM) Erythromycin *DERMATOLOGICALS* Anaphylaxis. Shellfish-derived Products Anaphylaxis, Hives, Swelling. ASA Buff (Mag Carb-Al Glyc) *ANALGESICS - NonNarcotic* Swelling. Iodine (Antiseptic) *ANTISEPTICS & DISINFECTANTS* Hives, Swelling. Peanut (Diagnostic) *DIAGNOSTIC PRODUCTS* Hives, Swelling.  Medication History Kendra [name], [place]; 09/27/2016 9:43 AM) ALPRAZolam (0.5MG  Tablet, Oral) Active. TraMADol HCl (50MG  Tablet, Oral) Active. LevETIRAcetam (500MG  Tablet, Oral) Active. Linzess (290MCG Capsule, Oral) Active. Omeprazole (40MG  Capsule DR, Oral) Active. PredniSONE (20MG  Tablet, Oral) Active. Albuterol Sulfate (108 (90 Base)MCG/ACT Aero Pow Br Act, Inhalation) Active. Xanax (0.5MG  Tablet, Oral daily) Active. Vitamin B-12 (100MCG Tablet, Oral) Active. Benadryl (25MG  Capsule, Oral) Active. Ibuprofen (800MG  Tablet, Oral as needed) Active. Mirena (52 MG) (20MCG/24HR IUD, Intrauterine) Active. Medications Reconciled  Social History Kendra [name], [place]; 09/27/2016 9:36 AM) Alcohol use Occasional alcohol use. Caffeine use Carbonated beverages, Coffee. No drug use Tobacco use Never smoker.  Family History Kendra [name], [place]; 09/27/2016 9:36 AM) Family history unknown First Degree Relatives  Pregnancy / Birth History Kendra [name], [place]; 09/27/2016 9:36 AM) Age at menarche 72 years. Contraceptive History Intrauterine device. Gravida 2 Irregular periods Length (months) of breastfeeding 7-12 Maternal age 64-25 Para 2  Other Problems Kendra [name], [place]; 09/27/2016 9:36 AM) Asthma Gastroesophageal Reflux Disease Seizure Disorder     Review of Systems Kendra [name] RMA; 09/27/2016 9:36 AM) General Not Present- Appetite Loss, Chills, Fatigue, Fever, Night Sweats, Weight Gain and Weight Loss. Skin Not Present- Change in Wart/Mole, Dryness, Hives, Jaundice, New Lesions, Non-Healing Wounds, Rash and Ulcer. HEENT Not Present- Earache, Hearing Loss, Hoarseness, Nose Bleed, Oral Ulcers, Ringing in the Ears, Seasonal Allergies, Sinus Pain, Sore Throat, Visual Disturbances, Wears glasses/contact lenses and Yellow Eyes. Respiratory Not Present- Bloody sputum, Chronic Cough, Difficulty Breathing, Snoring and Wheezing. Breast Not Present- Breast Mass, Breast Pain, Nipple Discharge and Skin Changes. Cardiovascular Not Present- Chest Pain, Difficulty Breathing Lying Down, Leg Cramps, Palpitations, Rapid Heart Rate, Shortness of Breath and Swelling of Extremities. Gastrointestinal Present- Abdominal Pain, Bloody Stool, Change in Bowel Habits, Chronic diarrhea, Constipation, Excessive gas, Indigestion, Nausea and Vomiting. Not Present- Bloating, Difficulty Swallowing, Gets full quickly at meals, Hemorrhoids and Rectal Pain. Female Genitourinary Present- Painful Urination. Not Present- Frequency, Nocturia, Pelvic Pain and Urgency. Musculoskeletal Present- Back Pain. Not Present- Joint Pain, Joint Stiffness, Muscle Pain, Muscle Weakness and Swelling of Extremities. Neurological Present- Headaches. Not Present- Decreased Memory, Fainting, Numbness, Seizures, Tingling, Tremor, Trouble walking and Weakness. Psychiatric Not Present- Anxiety, Bipolar, Change in Sleep Pattern, Depression, Fearful and Frequent  crying. Endocrine Not Present- Cold Intolerance, Excessive Hunger, Hair Changes, Heat Intolerance, Hot flashes and New Diabetes. Hematology Not Present- Blood Thinners, Easy Bruising, Excessive bleeding, Gland problems, HIV and Persistent Infections.  Vitals Kendra [name] RMA; 09/27/2016 9:43 AM) 09/27/2016 9:43 AM Weight: 226.2 lb Height: 60in Body Surface Area: 1.97 m Body Mass Index: 44.18 kg/m  Temp.: 98.38F  Pulse: 86 (Regular)  BP: 106/70 (Sitting, Left Arm, Standard)      Physical Exam Rodman Key K. [name] MD; 09/27/2016 10:23 AM)  The physical exam findings are as follows: Note:WDWN in  NAD Eyes: Pupils equal, round; sclera anicteric HENT: Oral mucosa moist; good dentition Neck: No masses palpated, no thyromegaly Lungs: CTA bilaterally; normal respiratory effort CV: Regular rate and rhythm; no murmurs; extremities well-perfused with no edema Abd: +bowel sounds, soft, healed incisions from lipoma excision; mild RUQ tenderness; no hernias palpated; no organomegaly Skin: Warm, dry; no sign of jaundice Psychiatric - alert and oriented x 4; calm mood and affect    Assessment & Plan Rodman Key K. Sophiah Rolin MD; 09/27/2016 10:03 AM)  CHRONIC CHOLECYSTITIS (K81.1)  Current Plans Schedule for Surgery - Laparoscopic cholecystectomy with intraoperative cholangiogram. The surgical procedure has been discussed with the patient. Potential risks, benefits, alternative treatments, and expected outcomes have been explained. All of the patient's questions at this time have been answered. The likelihood of reaching the patient's treatment goal is good. The patient understand the proposed surgical procedure and wishes to proceed  The patient would like to have her surgery performed prior to her craniotomy.  We will obtain clearance from Dr. Jannifer Franklin for general anesthesia.  Kendra [name]. Georgette Dover, MD, [facility] Surgery  General/ Trauma Surgery  09/27/2016 10:24 AM

## 2016-09-29 ENCOUNTER — Telehealth: Payer: Self-pay | Admitting: Neurology

## 2016-09-29 ENCOUNTER — Ambulatory Visit (INDEPENDENT_AMBULATORY_CARE_PROVIDER_SITE_OTHER): Payer: 59 | Admitting: Neurology

## 2016-09-29 DIAGNOSIS — R569 Unspecified convulsions: Secondary | ICD-10-CM | POA: Diagnosis not present

## 2016-09-29 DIAGNOSIS — D329 Benign neoplasm of meninges, unspecified: Secondary | ICD-10-CM

## 2016-09-29 NOTE — Procedures (Signed)
    History:  Kendra [name] is a 31 year old patient with a history of a seizure event that occurred on 07/13/2016 associated with generalized jerking of all fours and tongue biting and loss of control the bowels and the bladder. The patient was noted to have a meningioma in the anterior fossa that was previously documented. The patient is being evaluated for the seizures.  This is a routine EEG. No skull defects are noted. Medications include Ventolin inhaler, Advair, Keppra, Miranda, Prilosec, prednisone, and Ultram.   EEG classification: Normal awake and drowsy  Description of the recording: The background rhythms of this recording consists of a fairly well modulated medium amplitude alpha rhythm of 10 Hz that is reactive to eye opening and closure. As the record progresses, the patient appears to remain in the waking state throughout the recording. Photic stimulation was performed, resulting in a bilateral and symmetric photic driving response. Hyperventilation was also performed, resulting in a minimal buildup of the background rhythm activities without significant slowing seen. Toward the end of the recording, the patient enters the drowsy state with slight symmetric slowing seen. The patient never enters stage II sleep. At no time during the recording does there appear to be evidence of spike or spike wave discharges or evidence of focal slowing. EKG monitor shows no evidence of cardiac rhythm abnormalities with a heart rate of 84.  Impression: This is a normal EEG recording in the waking and drowsy state. No evidence of ictal or interictal discharges are seen.

## 2016-09-29 NOTE — Telephone Encounter (Signed)
I called the patient. The EEG study was normal. The patient is now complaining of some problems with headaches that have worsened over time. I will get a revisit to see her. She indicates that the neurosurgeon is planning on doing surgery to resect the meningioma sometime after April 2018.  The patient is planning on having gallbladder surgery, I see no neurologic contraindication to this. The patient has not reported any seizures since I have seen her in November 2017.

## 2016-09-30 ENCOUNTER — Telehealth: Payer: Self-pay

## 2016-09-30 NOTE — Telephone Encounter (Signed)
Appt scheduled Wed Feb 21st. Pt may call back if she needs to r/s.

## 2016-09-30 NOTE — Telephone Encounter (Signed)
-----   Message from Kathrynn Ducking, MD sent at 09/29/2016  6:00 PM EST ----- This patient is having headache problems, seen previously for seizures, will need a revisit sometime in the next couple weeks to address this issue.

## 2016-10-20 ENCOUNTER — Ambulatory Visit: Payer: Self-pay | Admitting: Neurology

## 2016-10-21 ENCOUNTER — Encounter: Payer: Self-pay | Admitting: Neurology

## 2016-10-21 ENCOUNTER — Ambulatory Visit (INDEPENDENT_AMBULATORY_CARE_PROVIDER_SITE_OTHER): Payer: 59 | Admitting: Neurology

## 2016-10-21 VITALS — BP 106/75 | HR 76 | Ht 61.0 in | Wt 225.0 lb

## 2016-10-21 DIAGNOSIS — G43019 Migraine without aura, intractable, without status migrainosus: Secondary | ICD-10-CM | POA: Diagnosis not present

## 2016-10-21 DIAGNOSIS — R569 Unspecified convulsions: Secondary | ICD-10-CM

## 2016-10-21 DIAGNOSIS — D329 Benign neoplasm of meninges, unspecified: Secondary | ICD-10-CM

## 2016-10-21 MED ORDER — LAMOTRIGINE 25 MG PO TABS: 75.0000 mg | ORAL_TABLET | Freq: Two times a day (BID) | ORAL | 3 refills | Status: DC

## 2016-10-21 MED ORDER — GABAPENTIN 100 MG PO CAPS: 100.0000 mg | ORAL_CAPSULE | Freq: Three times a day (TID) | ORAL | 2 refills | Status: DC

## 2016-10-21 NOTE — Progress Notes (Signed)
Reason for visit: Seizures, meningioma, headache  Kendra [name] is an 31 y.o. female  History of present illness:  Kendra [name] is a 31 year old right-handed black female with a history of morbid obesity, and a frontal meningioma that has significantly enlarged over time. The patient is planning to have surgery in April 2018. The patient has seizures that are likely related to the meningioma. She is having daily headaches at this point since December 2017. The headaches have become more prominent, and are generally in the left occipital area, spread down into the left neck and shoulder, with pain down the left arm and left leg. The patient denies any increased pain with turning her head. She indicates that she had a recent seizure on 10/19/2016 associated with tongue biting and urinary incontinence that occurred at night while in bed. The patient is on Keppra taking 500 mg twice daily, but she is barely tolerating the medication secondary to drowsiness. She is not sure that she can go up on the dose and still function. The patient questions whether the left side is slightly weak or not. The patient denies any significant gait imbalance, but no falls. She does not have any visual complaints. She comes to this office on an urgent basis today.  Past Medical History:  Diagnosis Date  . Asthma   . Convulsions/seizures ([place]) 07/27/2016  . H/O: C-section   . Lipoma   . Lipoma of LUQ abdomen, s/p removal 06/27/2012 05/24/2012  . Meningioma ([place]) 07/27/2016   Anterior fossa    Past Surgical History:  Procedure Laterality Date  . BREAST SURGERY  12/30/11   breast reduction  . CESAREAN SECTION  09/09/2010   [place]  . CESAREAN SECTION N/A 01/13/2015   Procedure: CESAREAN SECTION;  Surgeon: Janyth Pupa, DO;  Location: [facility];  Service: Obstetrics;  Laterality: N/A;  EDD 01/18/15 would like [place] dressing  . LYMPH NODE DISSECTION  2013   Abdominal    Family History  Problem Relation Age of Onset  .  Kidney disease Mother     kidney stones  . Cancer Maternal Grandmother     breast  . Cancer Maternal Grandfather     Colon  . Deep vein thrombosis Maternal Grandfather     in throat  . Cancer Maternal Aunt     breast  . Heart disease Maternal Uncle   . Stroke Maternal Uncle   . Liver cancer Maternal Uncle   . Diabetes Paternal Grandfather     Social history:  reports that she has never smoked. She has never used smokeless tobacco. She reports that she does not drink alcohol or use drugs.    Allergies  Allergen Reactions  . Erythromycin Anaphylaxis  . Shellfish Allergy Anaphylaxis, Hives, Swelling and Other (See Comments)    Pt throat swells  . Asa Buff (Mag [Buffered Aspirin] Swelling  . Iodine Hives and Swelling  . Peanut-Containing Drug Products Hives and Swelling    Pt is allergic to peanut oil    Medications:  Prior to Admission medications   Medication Sig Start Date End Date Taking? Authorizing Provider  albuterol (PROVENTIL HFA;VENTOLIN HFA) 108 (90 BASE) MCG/ACT inhaler Inhale 2 puffs into the lungs every 6 (six) hours as needed for wheezing or shortness of breath.   Yes Historical Provider, MD  ALPRAZolam Duanne Moron) 0.5 MG tablet Take 2 tablets approximately 45 minutes prior to the MRI study, take a third tablet if needed. 07/27/16  Yes Kathrynn Ducking, MD  Cyanocobalamin (  VITAMIN B-12 PO) Take 1 tablet by mouth daily.   Yes Historical Provider, MD  diphenhydrAMINE (BENADRYL) 25 mg capsule Take 1 capsule (25 mg total) by mouth every 6 (six) hours as needed. 07/17/16  Yes Courteney Lyn Mackuen, MD  Fluticasone-Salmeterol (ADVAIR) 250-50 MCG/DOSE AEPB Inhale 1 puff into the lungs daily.   Yes Historical Provider, MD  ibuprofen (ADVIL,MOTRIN) 800 MG tablet Take 1 tablet (800 mg total) by mouth every 8 (eight) hours as needed. 05/13/16  Yes Dalia Heading, PA-C  levonorgestrel (MIRENA) 20 MCG/24HR IUD 1 each by Intrauterine route once.   Yes Historical Provider, MD    LINZESS 290 MCG CAPS capsule Take 290 mcg by mouth daily. 07/16/16  Yes Historical Provider, MD  omeprazole (PRILOSEC) 40 MG capsule Take 40 mg by mouth daily. 07/15/16  Yes Historical Provider, MD  gabapentin (NEURONTIN) 100 MG capsule Take 1 capsule (100 mg total) by mouth 3 (three) times daily. 10/21/16   Kathrynn Ducking, MD  lamoTRIgine (LAMICTAL) 25 MG tablet Take 3 tablets (75 mg total) by mouth 2 (two) times daily. 10/21/16   Kathrynn Ducking, MD  levETIRAcetam (KEPPRA) 500 MG tablet Take 1 tablet (500 mg total) by mouth 2 (two) times daily. Patient not taking: Reported on 10/21/2016 07/13/16   Delice Bison Ward, DO    ROS:  Out of a complete 14 system review of symptoms, the patient complains only of the following symptoms, and all other reviewed systems are negative.  Food allergies Incontinence of the bladder with seizure Walking difficulty, neck pain, neck stiffness Memory loss, dizziness, headache, numbness, seizure  Blood pressure 106/75, pulse 76, height 5\' 1"  (1.549 m), weight 225 lb (102.1 kg), unknown if currently breastfeeding.  Physical Exam  General: The patient is alert and cooperative at the time of the examination. The patient is markedly obese.  Skin: No significant peripheral edema is noted.   Neurologic Exam  Mental status: The patient is alert and oriented x 3 at the time of the examination. The patient has apparent normal recent and remote memory, with an apparently normal attention span and concentration ability.   Cranial nerves: Facial symmetry is present. Speech is normal, no aphasia or dysarthria is noted. Extraocular movements are full. Visual fields are full.  Motor: The patient has good strength in all 4 extremities.  Sensory examination: Soft touch sensation is symmetric on the face, arms, and legs.  Coordination: The patient has good finger-nose-finger and heel-to-shin bilaterally.  Gait and station: The patient has a normal gait. Tandem gait  is normal. Romberg is negative. No drift is seen.  Reflexes: Deep tendon reflexes are symmetric.   Assessment/Plan:  1. Chronic daily headache, left occipital  2. Meningioma  3. Seizures with recent recurrence  The patient is doing poorly with the headaches and with the seizures. She is not tolerating the Keppra well, cannot likely go up on the dose of the Keppra. The patient will be placed on Lamictal, she will gradually go up on the dose and when she gets to 75 mg twice daily, she will call our office. We will initiate a taper off of the Keppra at that time. The patient will be placed on low-dose gabapentin for the neck and head pain. The patient may have cervicogenic headaches. We may consider MRI of the cervical spine in the future if she does not improve. The patient will follow-up in 3 months.  Jill Alexanders MD 10/21/2016 8:43 AM  [facility] [address]  [place], [place] [address]  Phone [phone] Fax [phone]

## 2016-10-21 NOTE — Patient Instructions (Signed)
With the Lamictal 25 mg tablets, start 1 twice a day for 2 weeks, then take 2 twice a day for 2 weeks, then take 3 tablets twice a day for 2 weeks, then call our office.  We will start gabapentin 100 mg capsules for the headache, call our office for dose adjustments.  Lamictal (lamotrigine) is a seizure medication that occasionally may be used for other purposes such as peripheral neuropathy pain or certain types of headache. This medication is relatively safe, but occasionally side effects can occur. A skin rash may occur when first starting the medication. As with any seizure medication, depression may worsen on the drug. Other potential side effects include dizziness, headache, drowsiness or insomnia, decreased concentration, or stomach upset. This medication may also be used as a mood stabilizer. If you believe that you are having side effects on the medication, please contact our office.  Neurontin (gabapentin) may result in drowsiness, ankle swelling, gait instability, or possibly dizziness. Please contact our office if significant side effects occur with this medication.

## 2016-10-25 ENCOUNTER — Other Ambulatory Visit: Payer: Self-pay | Admitting: Neurosurgery

## 2016-10-25 ENCOUNTER — Ambulatory Visit: Payer: Self-pay | Admitting: Neurology

## 2016-11-04 ENCOUNTER — Ambulatory Visit: Payer: Self-pay | Admitting: Neurology

## 2016-11-05 ENCOUNTER — Emergency Department (HOSPITAL_COMMUNITY)
Admission: EM | Admit: 2016-11-05 | Discharge: 2016-11-05 | Disposition: A | Discharge status: Home / Self Care | Payer: 59 | Attending: Emergency Medicine | Admitting: Emergency Medicine

## 2016-11-05 ENCOUNTER — Encounter (HOSPITAL_COMMUNITY): Payer: Self-pay | Admitting: *Deleted

## 2016-11-05 DIAGNOSIS — Z79899 Other long term (current) drug therapy: Secondary | ICD-10-CM | POA: Diagnosis not present

## 2016-11-05 DIAGNOSIS — L500 Allergic urticaria: Secondary | ICD-10-CM | POA: Insufficient documentation

## 2016-11-05 DIAGNOSIS — J45909 Unspecified asthma, uncomplicated: Secondary | ICD-10-CM | POA: Diagnosis not present

## 2016-11-05 DIAGNOSIS — Z86011 Personal history of benign neoplasm of the brain: Secondary | ICD-10-CM | POA: Insufficient documentation

## 2016-11-05 DIAGNOSIS — R21 Rash and other nonspecific skin eruption: Secondary | ICD-10-CM | POA: Diagnosis present

## 2016-11-05 DIAGNOSIS — Z91010 Allergy to peanuts: Secondary | ICD-10-CM | POA: Diagnosis not present

## 2016-11-05 DIAGNOSIS — T7840XA Allergy, unspecified, initial encounter: Secondary | ICD-10-CM

## 2016-11-05 MED ORDER — EPINEPHRINE 0.3 MG/0.3ML IJ SOAJ
0.3000 mg | Freq: Once | INTRAMUSCULAR | 0 refills | Status: AC
Start: 2016-11-05 — End: 2016-11-05

## 2016-11-05 MED ORDER — DIPHENHYDRAMINE HCL 50 MG/ML IJ SOLN
25.0000 mg | Freq: Once | INTRAMUSCULAR | Status: AC
  Administered 2016-11-05: 25 mg via INTRAVENOUS
  Filled 2016-11-05: qty 1

## 2016-11-05 MED ORDER — METHYLPREDNISOLONE SODIUM SUCC 125 MG IJ SOLR
125.0000 mg | Freq: Once | INTRAMUSCULAR | Status: AC
  Administered 2016-11-05: 125 mg via INTRAVENOUS
  Filled 2016-11-05: qty 2

## 2016-11-05 MED ORDER — FAMOTIDINE IN NACL 20-0.9 MG/50ML-% IV SOLN
20.0000 mg | Freq: Once | INTRAVENOUS | Status: AC
  Administered 2016-11-05: 20 mg via INTRAVENOUS
  Filled 2016-11-05: qty 50

## 2016-11-05 MED ORDER — SODIUM CHLORIDE 0.9 % IV BOLUS (SEPSIS)
1000.0000 mL | Freq: Once | INTRAVENOUS | Status: AC
  Administered 2016-11-05: 1000 mL via INTRAVENOUS

## 2016-11-05 NOTE — ED Provider Notes (Signed)
[facility] DEPT Provider Note   CSN: [id number] Arrival date & time: 11/05/16  1923     History   Chief Complaint Chief Complaint  Patient presents with  . Allergic Reaction    HPI Kendra [name] is a 31 y.o. female.  The history is provided by the patient, a parent and medical records.  Allergic Reaction  Presenting symptoms: difficulty breathing, itching, rash and swelling   Presenting symptoms: no wheezing   Severity:  Moderate Duration:  90 minutes Prior allergic episodes:  Food/nut allergies, seasonal allergies and plant allergies Context: food   Ineffective treatments:  Antihistamines   Past Medical History:  Diagnosis Date  . Asthma   . Convulsions/seizures ([place]) 07/27/2016  . H/O: C-section   . Lipoma   . Lipoma of LUQ abdomen, s/p removal 06/27/2012 05/24/2012  . Meningioma ([place]) 07/27/2016   Anterior fossa    Patient Active Problem List   Diagnosis Date Noted  . Convulsions/seizures ([place]) 07/27/2016  . Meningioma ([place]) 07/27/2016  . Common migraine with intractable migraine 07/27/2016  . Status post repeat low transverse cesarean section 01/14/2015  . Lipoma of LUQ abdomen, s/p removal 06/27/2012 05/24/2012  . 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 Chlamydia infection 12/13/2011  . 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 BREAST SURGERY  12/30/11   breast reduction  . CESAREAN SECTION  09/09/2010   [place]  . CESAREAN SECTION N/A 01/13/2015   Procedure: CESAREAN SECTION;  Surgeon: Janyth Pupa, DO;  Location: [facility];  Service: Obstetrics;  Laterality: N/A;  EDD 01/18/15 would like [place] dressing  . LYMPH NODE DISSECTION  2013   Abdominal    OB History    Gravida Para Term Preterm AB Living   2 2 2     2    SAB TAB Ectopic Multiple Live Births         0 1       Home Medications    Prior to Admission medications   Medication Sig Start Date End Date Taking?  Authorizing Provider  albuterol (PROVENTIL HFA;VENTOLIN HFA) 108 (90 BASE) MCG/ACT inhaler Inhale 2 puffs into the lungs every 6 (six) hours as needed for wheezing or shortness of breath.   Yes Historical Provider, MD  ALPRAZolam Duanne Moron) 0.5 MG tablet Take 2 tablets approximately 45 minutes prior to the MRI study, take a third tablet if needed. 07/27/16  Yes Kathrynn Ducking, MD  Cyanocobalamin (VITAMIN B-12 PO) Take 1 tablet by mouth daily.   Yes Historical Provider, MD  diphenhydrAMINE (BENADRYL) 25 mg capsule Take 1 capsule (25 mg total) by mouth every 6 (six) hours as needed. 07/17/16  Yes Courteney Lyn Mackuen, MD  Fluticasone-Salmeterol (ADVAIR) 250-50 MCG/DOSE AEPB Inhale 1 puff into the lungs daily.   Yes Historical Provider, MD  gabapentin (NEURONTIN) 100 MG capsule Take 1 capsule (100 mg total) by mouth 3 (three) times daily. 10/21/16  Yes Kathrynn Ducking, MD  lamoTRIgine (LAMICTAL) 25 MG tablet Take 3 tablets (75 mg total) by mouth 2 (two) times daily. 10/21/16  Yes Kathrynn Ducking, MD  levETIRAcetam (KEPPRA) 500 MG tablet Take 1 tablet (500 mg total) by mouth 2 (two) times daily. 07/13/16  Yes Kristen N Ward, DO  levonorgestrel (MIRENA) 20 MCG/24HR IUD 1 each by Intrauterine route once.   Yes Historical Provider, MD  LINZESS 290 MCG CAPS capsule Take 290 mcg by mouth daily. 07/16/16  Yes Historical Provider, MD  omeprazole (PRILOSEC) 40 MG capsule Take 40 mg by mouth daily. 07/15/16  Yes Historical Provider, MD  EPINEPHrine 0.3 mg/0.3 mL IJ SOAJ injection Inject 0.3 mLs (0.3 mg total) into the muscle once. 11/05/16 11/05/16  Monico Blitz, MD  ibuprofen (ADVIL,MOTRIN) 800 MG tablet Take 1 tablet (800 mg total) by mouth every 8 (eight) hours as needed. Patient not taking: Reported on 11/05/2016 05/13/16   Dalia Heading, PA-C    Family History Family History  Problem Relation Age of Onset  . Kidney disease Mother     kidney stones  . Cancer Maternal Grandmother     breast    . Cancer Maternal Grandfather     Colon  . Deep vein thrombosis Maternal Grandfather     in throat  . Cancer Maternal Aunt     breast  . Heart disease Maternal Uncle   . Stroke Maternal Uncle   . Liver cancer Maternal Uncle   . Diabetes Paternal Grandfather     Social History Social History  Substance Use Topics  . Smoking status: Never Smoker  . Smokeless tobacco: Never Used  . Alcohol use No     Allergies   Erythromycin; Shellfish allergy; Asa buff (mag [buffered aspirin]; Iodine; and Peanut-containing drug products   Review of Systems Review of Systems  HENT: Positive for facial swelling.   Respiratory: Positive for chest tightness. Negative for shortness of breath and wheezing.   Gastrointestinal: Negative for abdominal pain, nausea and vomiting.  Skin: Positive for itching and rash.  All other systems reviewed and are negative.    Physical Exam Updated Vital Signs BP 101/60   Pulse 80   Temp 98.3 F (36.8 C) (Oral)   Resp 16   LMP 10/22/2016   SpO2 97%   Physical Exam  Constitutional: She appears well-developed and well-nourished. No distress.  HENT:  Head: Normocephalic and atraumatic.  Mouth/Throat: Oropharynx is clear and moist.  Mallampati 3  Eyes: Conjunctivae and EOM are normal.  Neck: Neck supple.  Cardiovascular: Normal rate and regular rhythm.   Pulmonary/Chest: Effort normal and breath sounds normal. No respiratory distress. She has no wheezes.  Abdominal: Soft. There is no tenderness.  Musculoskeletal: Normal range of motion. She exhibits no edema.  Neurological: She is alert.  Skin: Skin is warm and dry. Capillary refill takes less than 2 seconds. Rash noted. Rash is urticarial (diffuse).  Psychiatric: She has a normal mood and affect.  Nursing note and vitals reviewed.    ED Treatments / Results  Labs (all labs ordered are listed, but only abnormal results are displayed) Labs Reviewed - No data to display  EKG  EKG  Interpretation None       Radiology No results found.  Procedures Procedures (including critical care time)  Medications Ordered in ED Medications  famotidine (PEPCID) IVPB 20 mg premix (0 mg Intravenous Stopped 11/05/16 2105)  methylPREDNISolone sodium succinate (SOLU-MEDROL) 125 mg/2 mL injection 125 mg (125 mg Intravenous Given 11/05/16 2008)  diphenhydrAMINE (BENADRYL) injection 25 mg (25 mg Intravenous Given 11/05/16 2008)  sodium chloride 0.9 % bolus 1,000 mL (0 mLs Intravenous Stopped 11/05/16 2332)     Initial Impression / Assessment and Plan / ED Course  I have reviewed the triage vital signs and the nursing notes.  Pertinent labs & imaging results that were available during my care of the patient were reviewed by me and considered in my medical decision making (see chart for details).     Presents for allergic  reaction 20 minutes after eating ramen noodles. She noted diffuse urticaria, facial swelling and chest tightness. Similar to prior episodes. Her Epipen was expired so she drank a large gulp of liquid benadryl from the bottle and came to the ED for further care. On exam, VSS, NAD, sating well on RA and speaking in full sentences. Diffuse urticaria. Allergic reaction, doubt anaphylaxis. Given IV famotidine, benadryl and solumedrol. Resolution of urticaria and chest tightness. Observed in ED without recurrence of symptoms. Given Rx Epipen. Return precautions given. Pt voiced understanding and agreement with plan.   Discussed with my attending physician, Dr Laneta Simmers.    Final Clinical Impressions(s) / ED Diagnoses   Final diagnoses:  Allergic reaction, initial encounter    New Prescriptions Discharge Medication List as of 11/05/2016 11:10 PM    START taking these medications   Details  EPINEPHrine 0.3 mg/0.3 mL IJ SOAJ injection Inject 0.3 mLs (0.3 mg total) into the muscle once., Starting Fri 11/05/2016, Print         Monico Blitz, MD 11/05/16 [id number]      Leo Grosser, MD 11/06/16 [phone]

## 2016-11-05 NOTE — ED Notes (Signed)
Noticeable reduction of hives to pt's face and torso.

## 2016-11-05 NOTE — ED Triage Notes (Signed)
The pt is c/o some difficulty breathing and hives with some chest pain after she ate  Some ramon  Noodles approx one hour ago.  She has allergies to seafood but this was chicken flavored

## 2016-11-15 ENCOUNTER — Emergency Department (HOSPITAL_COMMUNITY): Payer: 59

## 2016-11-15 ENCOUNTER — Encounter (HOSPITAL_COMMUNITY): Payer: Self-pay | Admitting: *Deleted

## 2016-11-15 ENCOUNTER — Emergency Department (HOSPITAL_COMMUNITY)
Admission: EM | Admit: 2016-11-15 | Discharge: 2016-11-15 | Disposition: A | Discharge status: Home / Self Care | Payer: 59 | Attending: Emergency Medicine | Admitting: Emergency Medicine

## 2016-11-15 DIAGNOSIS — J45909 Unspecified asthma, uncomplicated: Secondary | ICD-10-CM | POA: Diagnosis not present

## 2016-11-15 DIAGNOSIS — Z91010 Allergy to peanuts: Secondary | ICD-10-CM | POA: Diagnosis not present

## 2016-11-15 DIAGNOSIS — D329 Benign neoplasm of meninges, unspecified: Secondary | ICD-10-CM | POA: Diagnosis not present

## 2016-11-15 DIAGNOSIS — R51 Headache: Secondary | ICD-10-CM

## 2016-11-15 DIAGNOSIS — Z79899 Other long term (current) drug therapy: Secondary | ICD-10-CM | POA: Diagnosis not present

## 2016-11-15 DIAGNOSIS — R519 Headache, unspecified: Secondary | ICD-10-CM

## 2016-11-15 LAB — BASIC METABOLIC PANEL
ANION GAP: 9 (ref 5–15)
BUN: 10 mg/dL (ref 6–20)
CALCIUM: 9.1 mg/dL (ref 8.9–10.3)
CO2: 27 mmol/L (ref 22–32)
Chloride: 100 mmol/L — ABNORMAL LOW (ref 101–111)
Creatinine, Ser: 0.76 mg/dL (ref 0.44–1.00)
GFR calc non Af Amer: >60 mL/min (ref >60)
GLUCOSE: 83 mg/dL (ref 65–99)
POTASSIUM: 3.6 mmol/L (ref 3.5–5.1)
Sodium: 136 mmol/L (ref 135–145)

## 2016-11-15 LAB — CBC
HEMATOCRIT: 39.6 % (ref 36.0–46.0)
HEMOGLOBIN: 13.1 g/dL (ref 12.0–15.0)
MCH: 30.5 pg (ref 26.0–34.0)
MCHC: 33.1 g/dL (ref 30.0–36.0)
MCV: 92.1 fL (ref 78.0–100.0)
Platelets: 386 10*3/uL (ref 150–400)
RBC: 4.3 MIL/uL (ref 3.87–5.11)
RDW: 13.7 % (ref 11.5–15.5)
WBC: 5.2 10*3/uL (ref 4.0–10.5)

## 2016-11-15 MED ORDER — MORPHINE SULFATE (PF) 4 MG/ML IV SOLN
4.0000 mg | Freq: Once | INTRAVENOUS | Status: AC
  Administered 2016-11-15: 4 mg via INTRAVENOUS
  Filled 2016-11-15: qty 1

## 2016-11-15 MED ORDER — LORAZEPAM 2 MG/ML IJ SOLN
2.0000 mg | Freq: Once | INTRAMUSCULAR | Status: AC
  Administered 2016-11-15: 2 mg via INTRAVENOUS
  Filled 2016-11-15: qty 1

## 2016-11-15 MED ORDER — ONDANSETRON HCL 4 MG/2ML IJ SOLN
4.0000 mg | Freq: Once | INTRAMUSCULAR | Status: AC
  Administered 2016-11-15: 4 mg via INTRAVENOUS
  Filled 2016-11-15: qty 2

## 2016-11-15 MED ORDER — FENTANYL CITRATE (PF) 100 MCG/2ML IJ SOLN
50.0000 ug | Freq: Once | INTRAMUSCULAR | Status: AC
  Administered 2016-11-15: 50 ug via INTRAVENOUS
  Filled 2016-11-15: qty 2

## 2016-11-15 MED ORDER — HYDROCODONE-ACETAMINOPHEN 5-325 MG PO TABS: 1.0000 | ORAL_TABLET | ORAL | 0 refills | Status: DC | PRN

## 2016-11-15 MED ORDER — GADOBENATE DIMEGLUMINE 529 MG/ML IV SOLN
20.0000 mL | Freq: Once | INTRAVENOUS | Status: AC
  Administered 2016-11-15: 20 mL via INTRAVENOUS

## 2016-11-15 MED ORDER — SODIUM CHLORIDE 0.9 % IV BOLUS (SEPSIS)
1000.0000 mL | Freq: Once | INTRAVENOUS | Status: AC
  Administered 2016-11-15: 1000 mL via INTRAVENOUS

## 2016-11-15 MED ORDER — ONDANSETRON 4 MG PO TBDP: 4.0000 mg | ORAL_TABLET | Freq: Three times a day (TID) | ORAL | 0 refills | Status: DC | PRN

## 2016-11-15 NOTE — ED Provider Notes (Signed)
[facility] DEPT Provider Note   CSN: [id number] Arrival date & time: 11/15/16  1042  By signing my name below, I, Reola Mosher, attest that this documentation has been prepared under the direction and in the presence of Isla Pence, MD. Electronically Signed: Reola Mosher, ED Scribe. 11/15/16. 1:03 PM.  History   Chief Complaint Chief Complaint  Patient presents with  . Headache   The history is provided by the patient. No language interpreter was used.    HPI Comments: Kendra [name] is a 31 y.o. female with a h/o seizures, migraines, and meningioma, who presents to the Emergency Department complaining of persistent, worsening generalized headache beginning yesterday. Pt has a h/o similar headaches which pt relates to her meningioma, however, she states that her current headache is the worst she has ever experienced. She notes associated unilateral left sided numbenss which she woke up with this morning with associated paraesthesias to the area which are both new symptoms for her. No noted treatments for her headache were tried prior to coming into the ED. Per prior chart review, she is scheduled to have bicoronal craniotomy for resection of her tumor on 05/17 (~2 months from now). Pt has not informed her neurosurgeon about her current symptoms or worsening headache. She denies slurred speech, visual disturbance, or any other associated symptoms.   Past Medical History:  Diagnosis Date  . Asthma   . Convulsions/seizures ([place]) 07/27/2016  . H/O: C-section   . Lipoma   . Lipoma of LUQ abdomen, s/p removal 06/27/2012 05/24/2012  . Meningioma ([place]) 07/27/2016   Anterior fossa   Patient Active Problem List   Diagnosis Date Noted  . Convulsions/seizures ([place]) 07/27/2016  . Meningioma ([place]) 07/27/2016  . Common migraine with intractable migraine 07/27/2016  . Status post repeat low transverse cesarean section 01/14/2015  . Lipoma of LUQ abdomen, s/p removal  06/27/2012 05/24/2012  . 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 Chlamydia infection 12/13/2011  . 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 BREAST SURGERY  12/30/11   breast reduction  . CESAREAN SECTION  09/09/2010   [place]  . CESAREAN SECTION N/A 01/13/2015   Procedure: CESAREAN SECTION;  Surgeon: Janyth Pupa, DO;  Location: [facility];  Service: Obstetrics;  Laterality: N/A;  EDD 01/18/15 would like [place] dressing  . LYMPH NODE DISSECTION  2013   Abdominal   OB History    Gravida Para Term Preterm AB Living   2 2 2     2    SAB TAB Ectopic Multiple Live Births         0 1     Home Medications    Prior to Admission medications   Medication Sig Start Date End Date Taking? Authorizing Provider  albuterol (PROVENTIL HFA;VENTOLIN HFA) 108 (90 BASE) MCG/ACT inhaler Inhale 2 puffs into the lungs every 6 (six) hours as needed for wheezing or shortness of breath.   Yes Historical Provider, MD  diphenhydrAMINE (BENADRYL) 25 mg capsule Take 1 capsule (25 mg total) by mouth every 6 (six) hours as needed. 07/17/16  Yes Courteney Lyn Mackuen, MD  EPINEPHrine 0.3 mg/0.3 mL IJ SOAJ injection Inject 0.3 mg into the muscle as needed.   Yes Historical Provider, MD  Fluticasone-Salmeterol (ADVAIR) 250-50 MCG/DOSE AEPB Inhale 1 puff into the lungs daily as needed (shortness of breath).    Yes Historical Provider, MD  gabapentin (NEURONTIN) 100 MG capsule Take 1 capsule (100 mg  total) by mouth 3 (three) times daily. 10/21/16  Yes Kathrynn Ducking, MD  lamoTRIgine (LAMICTAL) 25 MG tablet Take 3 tablets (75 mg total) by mouth 2 (two) times daily. Patient taking differently: Take 50 mg by mouth 2 (two) times daily.  10/21/16  Yes Kathrynn Ducking, MD  levETIRAcetam (KEPPRA) 500 MG tablet Take 1 tablet (500 mg total) by mouth 2 (two) times daily. 07/13/16  Yes Kristen N Ward, DO  levonorgestrel (MIRENA) 20 MCG/24HR IUD 1 each by  Intrauterine route once.   Yes Historical Provider, MD  LINZESS 290 MCG CAPS capsule Take 290 mcg by mouth daily as needed (constipation).  07/16/16  Yes Historical Provider, MD  omeprazole (PRILOSEC) 40 MG capsule Take 40 mg by mouth daily as needed.   Yes Historical Provider, MD  HYDROcodone-acetaminophen (NORCO/VICODIN) 5-325 MG tablet Take 1 tablet by mouth every 4 (four) hours as needed. 11/15/16   Isla Pence, MD  ondansetron (ZOFRAN ODT) 4 MG disintegrating tablet Take 1 tablet (4 mg total) by mouth every 8 (eight) hours as needed. 11/15/16   Isla Pence, MD   Family History Family History  Problem Relation Age of Onset  . Kidney disease Mother     kidney stones  . Cancer Maternal Grandmother     breast  . Cancer Maternal Grandfather     Colon  . Deep vein thrombosis Maternal Grandfather     in throat  . Cancer Maternal Aunt     breast  . Heart disease Maternal Uncle   . Stroke Maternal Uncle   . Liver cancer Maternal Uncle   . Diabetes Paternal Grandfather    Social History Social History  Substance Use Topics  . Smoking status: Never Smoker  . Smokeless tobacco: Never Used  . Alcohol use No   Allergies   Asa buff (mag [buffered aspirin]; Erythromycin; Iodine; Peanut-containing drug products; and Shellfish allergy  Review of Systems Review of Systems  A complete 10 system review of systems was obtained and all systems are negative except as noted in the HPI and PMH.   Physical Exam Updated Vital Signs BP 109/73   Pulse 81   Temp 98.1 F (36.7 C) (Oral)   Resp 16   Ht 5' (1.524 m)   Wt 223 lb 3 oz (101.2 kg)   LMP 11/15/2016   SpO2 98%   Breastfeeding? No   BMI 43.59 kg/m   Physical Exam  Constitutional: She appears well-developed and well-nourished. No distress.  HENT:  Head: Normocephalic and atraumatic.  Eyes: Conjunctivae are normal.  Neck: Normal range of motion.  Cardiovascular: Normal rate, regular rhythm and normal heart sounds.   No  murmur heard. Pulmonary/Chest: Effort normal and breath sounds normal. No respiratory distress. She has no wheezes. She has no rales.  Abdominal: She exhibits no distension.  Musculoskeletal: Normal range of motion.  Neurological: She is alert.  There is subjective numbness to the entire left side, per pt.   Skin: No pallor.  Psychiatric: She has a normal mood and affect. Her behavior is normal.  Nursing note and vitals reviewed.  ED Treatments / Results  DIAGNOSTIC STUDIES: Oxygen Saturation is 100% on RA, normal by my interpretation.   COORDINATION OF CARE: 12:59 PM-Discussed next steps with pt. Pt verbalized understanding and is agreeable with the plan.   Labs (all labs ordered are listed, but only abnormal results are displayed) Labs Reviewed  BASIC METABOLIC PANEL - Abnormal; Notable for the following:  Result Value   Chloride 100 (*)    All other components within normal limits  CBC  URINALYSIS, ROUTINE W REFLEX MICROSCOPIC   EKG  EKG Interpretation None      Radiology Ct Head Wo Contrast  Result Date: 11/15/2016 CLINICAL DATA:  LEFT side headache, LEFT arm numbness and tingling, history brain tumor scheduled for surgery in May, meningioma EXAM: CT HEAD WITHOUT CONTRAST TECHNIQUE: Contiguous axial images were obtained from the base of the skull through the vertex without intravenous contrast. Sagittal and coronal MPR images reconstructed from axial data set. COMPARISON:  CT head 07/13/2016; correlation MR brain 08/20/2016 FINDINGS: Brain: Normal ventricular morphology. No midline shift or mass effect. Subfrontal extra-axial appearing mass identified at the midline, 2.8 x 1.4 x 2.5 cm in size, slightly hyperdense, consistent with meningioma. This is not significantly changed from 07/13/2016. Otherwise normal appearance of brain parenchyma. No additional mass lesions, intracranial hemorrhage or evidence of acute infarction. No extra-axial fluid collections. Vascular:  Unremarkable Skull: Intact Sinuses/Orbits: Clear Other: N/A IMPRESSION: 2.8 x 1.4 x 2.5 cm diameter subfrontal hyperdense mass which appears extra-axial in origin most consistent with a meningioma, not significantly changed since 07/13/2016. No new intracranial abnormalities. Electronically Signed   By: Lavonia Dana M.D.   On: 11/15/2016 14:14   Mr Jeri Cos And Wo Contrast  Result Date: 11/15/2016 CLINICAL DATA:  Initial evaluation for acute headache. History of migraines, seizure, meningioma. EXAM: MRI HEAD WITHOUT AND WITH CONTRAST TECHNIQUE: Multiplanar, multiecho pulse sequences of the brain and surrounding structures were obtained without and with intravenous contrast. CONTRAST:  43mL MULTIHANCE GADOBENATE DIMEGLUMINE 529 MG/ML IV SOLN COMPARISON:  Prior MRI from 08/20/2016. FINDINGS: Brain: Cerebral volume within normal limits. No significant cerebral white matter disease. No abnormal foci of restricted diffusion to suggest acute or subacute ischemia. Gray-white matter differentiation maintained. No evidence for acute or chronic intracranial hemorrhage. No evidence for chronic infarction. The patient's known meningioma centered along the planum sphenoid ale measures 3.5 x 2.4 x 2.1 cm (AP by transverse by craniocaudad). Overall, this is not significantly changed from previous. Associated mild edema within the inferior right frontal lobe, also similar. No other mass lesion. No midline shift. No hydrocephalus. No extra-axial fluid collection. Major dural sinuses are grossly patent. 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 Vascular: Major intracranial vascular flow voids maintained. Skull and upper cervical spine: Craniocervical junction within normal limits. Visualized upper cervical spine unremarkable. Bone marrow signal intensity within normal limits. Scalp soft tissues unremarkable. Sinuses/Orbits: Globes and orbital soft tissues within normal limits. Scattered mucosal thickening within the ethmoidal air  cells. Paranasal sinuses are otherwise clear. No mastoid effusion. Inner ear structures within normal limits. Other: No other significant finding. IMPRESSION: 1. No significant interval change in 3.5 x 2.4 x 2.1 cm meningioma positioned along the planum sphenoid ale. Associated edema within the anterior inferior right frontal lobe not significantly changed. 2. Otherwise negative brain MRI. No other acute intracranial process identified. Electronically Signed   By: Jeannine Boga M.D.   On: 11/15/2016 20:41    Procedures Procedures   Medications Ordered in ED Medications  sodium chloride 0.9 % bolus 1,000 mL (0 mLs Intravenous Stopped 11/15/16 1810)  morphine 4 MG/ML injection 4 mg (4 mg Intravenous Given 11/15/16 1327)  ondansetron (ZOFRAN) injection 4 mg (4 mg Intravenous Given 11/15/16 1327)  fentaNYL (SUBLIMAZE) injection 50 mcg (50 mcg Intravenous Given 11/15/16 1506)  LORazepam (ATIVAN) injection 2 mg (2 mg Intravenous Given 11/15/16 1926)  gadobenate dimeglumine (  MULTIHANCE) injection 20 mL (20 mLs Intravenous Contrast Given 11/15/16 2011)    Initial Impression / Assessment and Plan / ED Course  I have reviewed the triage vital signs and the nursing notes.  Pertinent labs & imaging results that were available during my care of the patient were reviewed by me and considered in my medical decision making (see chart for details).    Pt concerned that she was having a stroke, so a MRI was ordered which showed a stable meningioma and no stroke. Pt's numbness and headache are improved.  She is encouraged to f/u with Dr. Kathyrn Sheriff.  Final Clinical Impressions(s) / ED Diagnoses   Final diagnoses:  Meningioma ([place])  Acute nonintractable headache, unspecified headache type   New Prescriptions New Prescriptions   HYDROCODONE-ACETAMINOPHEN (NORCO/VICODIN) 5-325 MG TABLET    Take 1 tablet by mouth every 4 (four) hours as needed.   ONDANSETRON (ZOFRAN ODT) 4 MG DISINTEGRATING TABLET    Take  1 tablet (4 mg total) by mouth every 8 (eight) hours as needed.   I personally performed the services described in this documentation, which was scribed in my presence. The recorded information has been reviewed and is accurate.     Isla Pence, MD 11/15/16 2051

## 2016-11-15 NOTE — ED Notes (Signed)
Pt. Transported to CT by transporter. Pt. VSS prior to transport.

## 2016-11-15 NOTE — ED Notes (Signed)
Awaiting mri, pt and family updated and is understanding

## 2016-11-15 NOTE — ED Notes (Signed)
Pt Still in MRI 

## 2016-11-15 NOTE — ED Notes (Signed)
Pt returned from ct

## 2016-11-15 NOTE — ED Triage Notes (Signed)
Pt in c/o HA L sided & L arm numbness tingling, pt reports having a brain tumor with scheduled brain surgery in may, pt ambulatory, pt denies slurred speech,denies blurred vision, c/o dizziness,  c/o L ear pain, A&O x4

## 2016-11-16 ENCOUNTER — Encounter (HOSPITAL_COMMUNITY)
Admission: RE | Admit: 2016-11-16 | Discharge: 2016-11-16 | Disposition: A | Discharge status: Home / Self Care | Payer: 59 | Source: Ambulatory Visit | Attending: Surgery | Admitting: Surgery

## 2016-11-16 ENCOUNTER — Encounter (HOSPITAL_COMMUNITY): Payer: Self-pay

## 2016-11-16 DIAGNOSIS — Z3202 Encounter for pregnancy test, result negative: Secondary | ICD-10-CM | POA: Diagnosis not present

## 2016-11-16 DIAGNOSIS — Z01818 Encounter for other preprocedural examination: Secondary | ICD-10-CM | POA: Diagnosis present

## 2016-11-16 LAB — PREGNANCY, URINE: Preg Test, Ur: NEGATIVE

## 2016-11-16 NOTE — Progress Notes (Signed)
PCP - [place] Cardiologist - denies  Chest x-ray - not needed EKG - 04/10/16 Stress Test - denies ECHO - denies Cardiac Cath - denies     Patient denies shortness of breath, fever, cough and chest pain at PAT appointment   Patient verbalized understanding of instructions that was given to them at the PAT appointment. Patient expressed that there were no further questions.  Patient was also instructed that they will need to review over the PAT instructions again at home before the surgery.

## 2016-11-16 NOTE — Pre-Procedure Instructions (Signed)
[place] JOSCELYN HARDRICK  11/16/2016      [address], [place], [place] [address] Phone: [phone] Fax: [phone]  [facility] [place], [place] [address] Phone: [phone] Fax: [phone]    Your procedure is scheduled on March 27  Report to [facility] at 0730 A.M.  Call this number if you have problems the morning of surgery:  [phone]   Remember:  Do not eat food or drink liquids after midnight.   Take these medicines the morning of surgery with A SIP OF WATER albuterol (PROVENTIL HFA;VENTOLIN HFA), Fluticasone-Salmeterol (ADVAIR), gabapentin (NEURONTIN), HYDROcodone-acetaminophen (NORCO/VICODIN), lamoTRIgine (LAMICTAL), levETIRAcetam (KEPPRA), omeprazole (PRILOSEC), LINZESS, ondansetron (ZOFRAN ODT)   7 days prior to surgery STOP taking any Aspirin, Aleve, Naproxen, Ibuprofen, Motrin, Advil, Goody's, BC's, all herbal medications, fish oil, and all vitamins    Do not wear jewelry, make-up or nail polish.  Do not wear lotions, powders, or perfumes, or deoderant.  Do not shave 48 hours prior to surgery.  Men may shave face and neck.  Do not bring valuables to the hospital.  [facility] is not responsible for any belongings or valuables.  Contacts, dentures or bridgework may not be worn into surgery.  Leave your suitcase in the car.  After surgery it may be brought to your room.  For patients admitted to the hospital, discharge time will be determined by your treatment team.  Patients discharged the day of surgery will not be allowed to drive home.    Special instructions:   [facility]- Preparing For Surgery  Before surgery, you can play an important role. Because skin is not sterile, your skin needs to be as free of germs as possible. You can reduce the  number of germs on your skin by washing with CHG (chlorahexidine gluconate) Soap before surgery.  CHG is an antiseptic cleaner which kills germs and bonds with the skin to continue killing germs even after washing.  Please do not use if you have an allergy to CHG or antibacterial soaps. If your skin becomes reddened/irritated stop using the CHG.  Do not shave (including legs and underarms) for at least 48 hours prior to first CHG shower. It is OK to shave your face.  Please follow these instructions carefully.   1. Shower the NIGHT BEFORE SURGERY and the MORNING OF SURGERY with CHG.   2. If you chose to wash your hair, wash your hair first as usual with your normal shampoo.  3. After you shampoo, rinse your hair and body thoroughly to remove the shampoo.  4. Use CHG as you would any other liquid soap. You can apply CHG directly to the skin and wash gently with a scrungie or a clean washcloth.   5. Apply the CHG Soap to your body ONLY FROM THE NECK DOWN.  Do not use on open wounds or open sores. Avoid contact with your eyes, ears, mouth and genitals (private parts). Wash genitals (private parts) with your normal soap.  6. Wash thoroughly, paying special attention to the area where your surgery will be performed.  7. Thoroughly rinse your body with warm water from the neck down.  8. DO NOT shower/wash with your normal soap after using and rinsing off the CHG Soap.  9. Pat yourself dry with a  CLEAN TOWEL.   10. Wear CLEAN PAJAMAS   11. Place CLEAN SHEETS on your bed the night of your first shower and DO NOT SLEEP WITH PETS.    Day of Surgery: Do not apply any deodorants/lotions. Please wear clean clothes to the hospital/surgery center.      Please read over the following fact sheets that you were given.

## 2016-11-17 ENCOUNTER — Other Ambulatory Visit (HOSPITAL_COMMUNITY): Payer: Self-pay | Admitting: Neurosurgery

## 2016-11-17 DIAGNOSIS — D329 Benign neoplasm of meninges, unspecified: Secondary | ICD-10-CM

## 2016-11-18 DIAGNOSIS — R569 Unspecified convulsions: Secondary | ICD-10-CM | POA: Diagnosis not present

## 2016-11-18 DIAGNOSIS — D329 Benign neoplasm of meninges, unspecified: Secondary | ICD-10-CM | POA: Diagnosis not present

## 2016-11-18 DIAGNOSIS — J45909 Unspecified asthma, uncomplicated: Secondary | ICD-10-CM | POA: Diagnosis not present

## 2016-11-18 DIAGNOSIS — K8010 Calculus of gallbladder with chronic cholecystitis without obstruction: Secondary | ICD-10-CM | POA: Diagnosis not present

## 2016-11-18 DIAGNOSIS — T781XXA Other adverse food reactions, not elsewhere classified, initial encounter: Secondary | ICD-10-CM | POA: Diagnosis not present

## 2016-11-23 ENCOUNTER — Encounter (HOSPITAL_COMMUNITY): Payer: Self-pay | Admitting: *Deleted

## 2016-11-23 ENCOUNTER — Encounter (HOSPITAL_COMMUNITY)
Admission: RE | Disposition: A | Discharge status: Home / Self Care | Payer: Self-pay | Source: Ambulatory Visit | Attending: Surgery

## 2016-11-23 ENCOUNTER — Ambulatory Visit (HOSPITAL_COMMUNITY): Payer: 59 | Admitting: Anesthesiology

## 2016-11-23 ENCOUNTER — Ambulatory Visit (HOSPITAL_COMMUNITY)
Admission: RE | Admit: 2016-11-23 | Discharge: 2016-11-23 | Disposition: A | Discharge status: Home / Self Care | Payer: 59 | Source: Ambulatory Visit | Attending: Surgery | Admitting: Surgery

## 2016-11-23 ENCOUNTER — Ambulatory Visit (HOSPITAL_COMMUNITY): Payer: 59

## 2016-11-23 DIAGNOSIS — K219 Gastro-esophageal reflux disease without esophagitis: Secondary | ICD-10-CM | POA: Diagnosis not present

## 2016-11-23 DIAGNOSIS — K5900 Constipation, unspecified: Secondary | ICD-10-CM | POA: Diagnosis not present

## 2016-11-23 DIAGNOSIS — Z419 Encounter for procedure for purposes other than remedying health state, unspecified: Secondary | ICD-10-CM

## 2016-11-23 DIAGNOSIS — J45909 Unspecified asthma, uncomplicated: Secondary | ICD-10-CM | POA: Diagnosis not present

## 2016-11-23 DIAGNOSIS — K811 Chronic cholecystitis: Secondary | ICD-10-CM | POA: Diagnosis not present

## 2016-11-23 DIAGNOSIS — Z791 Long term (current) use of non-steroidal anti-inflammatories (NSAID): Secondary | ICD-10-CM | POA: Diagnosis not present

## 2016-11-23 DIAGNOSIS — G40909 Epilepsy, unspecified, not intractable, without status epilepticus: Secondary | ICD-10-CM | POA: Diagnosis not present

## 2016-11-23 DIAGNOSIS — Z79899 Other long term (current) drug therapy: Secondary | ICD-10-CM | POA: Diagnosis not present

## 2016-11-23 DIAGNOSIS — R1084 Generalized abdominal pain: Secondary | ICD-10-CM | POA: Diagnosis present

## 2016-11-23 DIAGNOSIS — G43919 Migraine, unspecified, intractable, without status migrainosus: Secondary | ICD-10-CM | POA: Diagnosis not present

## 2016-11-23 DIAGNOSIS — Z0389 Encounter for observation for other suspected diseases and conditions ruled out: Secondary | ICD-10-CM | POA: Diagnosis not present

## 2016-11-23 HISTORY — PX: CHOLECYSTECTOMY: SHX55

## 2016-11-23 SURGERY — LAPAROSCOPIC CHOLECYSTECTOMY WITH INTRAOPERATIVE CHOLANGIOGRAM
Anesthesia: General | Site: Abdomen

## 2016-11-23 MED ORDER — 0.9 % SODIUM CHLORIDE (POUR BTL) OPTIME
TOPICAL | Status: DC | PRN
  Administered 2016-11-23: 1000 mL

## 2016-11-23 MED ORDER — LACTATED RINGERS IV SOLN
INTRAVENOUS | Status: DC
  Administered 2016-11-23 (×3): via INTRAVENOUS

## 2016-11-23 MED ORDER — LIDOCAINE HCL (CARDIAC) 20 MG/ML IV SOLN
INTRAVENOUS | Status: DC | PRN
  Administered 2016-11-23: 100 mg via INTRAVENOUS

## 2016-11-23 MED ORDER — NEOSTIGMINE METHYLSULFATE 10 MG/10ML IV SOLN: INTRAVENOUS | Status: DC | PRN

## 2016-11-23 MED ORDER — HYDROCODONE-ACETAMINOPHEN 5-325 MG PO TABS: 1.0000 | ORAL_TABLET | Freq: Four times a day (QID) | ORAL | 0 refills | Status: DC | PRN

## 2016-11-23 MED ORDER — DEXAMETHASONE SODIUM PHOSPHATE 4 MG/ML IJ SOLN
INTRAMUSCULAR | Status: DC | PRN
  Administered 2016-11-23: 4 mg via INTRAVENOUS

## 2016-11-23 MED ORDER — MIDAZOLAM HCL 5 MG/5ML IJ SOLN
INTRAMUSCULAR | Status: DC | PRN
  Administered 2016-11-23: 2 mg via INTRAVENOUS

## 2016-11-23 MED ORDER — ROCURONIUM BROMIDE 50 MG/5ML IV SOSY
PREFILLED_SYRINGE | INTRAVENOUS | Status: AC
  Filled 2016-11-23: qty 5

## 2016-11-23 MED ORDER — SODIUM CHLORIDE 0.9 % IV SOLN
INTRAVENOUS | Status: DC | PRN
  Administered 2016-11-23: 7 mL

## 2016-11-23 MED ORDER — SUGAMMADEX SODIUM 500 MG/5ML IV SOLN
INTRAVENOUS | Status: DC | PRN
  Administered 2016-11-23 (×2): 200 mg via INTRAVENOUS

## 2016-11-23 MED ORDER — ROCURONIUM BROMIDE 100 MG/10ML IV SOLN
INTRAVENOUS | Status: DC | PRN
  Administered 2016-11-23: 40 mg via INTRAVENOUS

## 2016-11-23 MED ORDER — DEXAMETHASONE SODIUM PHOSPHATE 10 MG/ML IJ SOLN
INTRAMUSCULAR | Status: AC
  Filled 2016-11-23: qty 1

## 2016-11-23 MED ORDER — ONDANSETRON HCL 4 MG/2ML IJ SOLN
INTRAMUSCULAR | Status: AC
  Filled 2016-11-23: qty 2

## 2016-11-23 MED ORDER — FENTANYL CITRATE (PF) 100 MCG/2ML IJ SOLN
INTRAMUSCULAR | Status: AC
  Filled 2016-11-23: qty 4

## 2016-11-23 MED ORDER — SODIUM CHLORIDE 0.9 % IR SOLN
Status: DC | PRN
  Administered 2016-11-23: 1000 mL

## 2016-11-23 MED ORDER — EPHEDRINE 5 MG/ML INJ
INTRAVENOUS | Status: AC
  Filled 2016-11-23: qty 10

## 2016-11-23 MED ORDER — HYDROMORPHONE HCL 1 MG/ML IJ SOLN
0.2500 mg | INTRAMUSCULAR | Status: DC | PRN
  Administered 2016-11-23 (×2): 0.5 mg via INTRAVENOUS

## 2016-11-23 MED ORDER — BUPIVACAINE HCL (PF) 0.25 % IJ SOLN
INTRAMUSCULAR | Status: DC | PRN
  Administered 2016-11-23: 14 mL

## 2016-11-23 MED ORDER — LIDOCAINE 2% (20 MG/ML) 5 ML SYRINGE
INTRAMUSCULAR | Status: AC
  Filled 2016-11-23: qty 5

## 2016-11-23 MED ORDER — CHLORHEXIDINE GLUCONATE CLOTH 2 % EX PADS: 6.0000 | MEDICATED_PAD | Freq: Once | CUTANEOUS | Status: DC

## 2016-11-23 MED ORDER — SUGAMMADEX SODIUM 200 MG/2ML IV SOLN
INTRAVENOUS | Status: AC
  Filled 2016-11-23: qty 2

## 2016-11-23 MED ORDER — IOPAMIDOL (ISOVUE-300) INJECTION 61%
INTRAVENOUS | Status: AC
  Filled 2016-11-23: qty 50

## 2016-11-23 MED ORDER — PHENYLEPHRINE 40 MCG/ML (10ML) SYRINGE FOR IV PUSH (FOR BLOOD PRESSURE SUPPORT)
PREFILLED_SYRINGE | INTRAVENOUS | Status: AC
  Filled 2016-11-23: qty 10

## 2016-11-23 MED ORDER — SUCCINYLCHOLINE CHLORIDE 20 MG/ML IJ SOLN
INTRAMUSCULAR | Status: DC | PRN
  Administered 2016-11-23: 100 mg via INTRAVENOUS

## 2016-11-23 MED ORDER — MIDAZOLAM HCL 2 MG/2ML IJ SOLN
INTRAMUSCULAR | Status: AC
  Filled 2016-11-23: qty 2

## 2016-11-23 MED ORDER — PROPOFOL 10 MG/ML IV BOLUS
INTRAVENOUS | Status: AC
  Filled 2016-11-23: qty 20

## 2016-11-23 MED ORDER — SUGAMMADEX SODIUM 500 MG/5ML IV SOLN
INTRAVENOUS | Status: AC
  Filled 2016-11-23: qty 10

## 2016-11-23 MED ORDER — PROPOFOL 10 MG/ML IV BOLUS
INTRAVENOUS | Status: DC | PRN
  Administered 2016-11-23: 200 mg via INTRAVENOUS

## 2016-11-23 MED ORDER — ONDANSETRON HCL 4 MG/2ML IJ SOLN
INTRAMUSCULAR | Status: DC | PRN
  Administered 2016-11-23: 4 mg via INTRAVENOUS

## 2016-11-23 MED ORDER — HYDROMORPHONE HCL 1 MG/ML IJ SOLN
INTRAMUSCULAR | Status: AC
  Filled 2016-11-23: qty 1

## 2016-11-23 MED ORDER — CEFAZOLIN SODIUM-DEXTROSE 2-4 GM/100ML-% IV SOLN
2.0000 g | INTRAVENOUS | Status: AC
  Administered 2016-11-23: 2 g via INTRAVENOUS
  Filled 2016-11-23: qty 100

## 2016-11-23 MED ORDER — BUPIVACAINE HCL (PF) 0.25 % IJ SOLN
INTRAMUSCULAR | Status: AC
  Filled 2016-11-23: qty 30

## 2016-11-23 MED ORDER — SUCCINYLCHOLINE CHLORIDE 200 MG/10ML IV SOSY
PREFILLED_SYRINGE | INTRAVENOUS | Status: AC
  Filled 2016-11-23: qty 10

## 2016-11-23 MED ORDER — FENTANYL CITRATE (PF) 100 MCG/2ML IJ SOLN
INTRAMUSCULAR | Status: DC | PRN
  Administered 2016-11-23 (×2): 50 ug via INTRAVENOUS

## 2016-11-23 SURGICAL SUPPLY — 41 items
APPLIER CLIP ROT 10 11.4 M/L (STAPLE) ×2
BENZOIN TINCTURE PRP APPL 2/3 (GAUZE/BANDAGES/DRESSINGS) ×2 IMPLANT
BLADE CLIPPER SURG (BLADE) IMPLANT
CANISTER SUCT 3000ML PPV (MISCELLANEOUS) ×2 IMPLANT
CHLORAPREP W/TINT 26ML (MISCELLANEOUS) ×2 IMPLANT
CLIP APPLIE ROT 10 11.4 M/L (STAPLE) ×1 IMPLANT
COVER MAYO STAND STRL (DRAPES) ×2 IMPLANT
COVER SURGICAL LIGHT HANDLE (MISCELLANEOUS) ×2 IMPLANT
DECANTER SPIKE VIAL GLASS SM (MISCELLANEOUS) ×2 IMPLANT
DRAPE C-ARM 42X72 X-RAY (DRAPES) ×2 IMPLANT
DRSG TEGADERM 2-3/8X2-3/4 SM (GAUZE/BANDAGES/DRESSINGS) ×2 IMPLANT
DRSG TEGADERM 4X4.75 (GAUZE/BANDAGES/DRESSINGS) ×2 IMPLANT
ELECT REM PT RETURN 9FT ADLT (ELECTROSURGICAL) ×2
ELECTRODE REM PT RTRN 9FT ADLT (ELECTROSURGICAL) ×1 IMPLANT
FILTER SMOKE EVAC LAPAROSHD (FILTER) ×2 IMPLANT
GAUZE SPONGE 2X2 8PLY STRL LF (GAUZE/BANDAGES/DRESSINGS) ×1 IMPLANT
GLOVE BIO SURGEON STRL SZ7 (GLOVE) ×2 IMPLANT
GLOVE BIOGEL PI IND STRL 7.5 (GLOVE) ×1 IMPLANT
GLOVE BIOGEL PI INDICATOR 7.5 (GLOVE) ×1
GOWN STRL REUS W/ TWL LRG LVL3 (GOWN DISPOSABLE) ×3 IMPLANT
GOWN STRL REUS W/TWL LRG LVL3 (GOWN DISPOSABLE) ×3
KIT BASIN OR (CUSTOM PROCEDURE TRAY) ×2 IMPLANT
KIT ROOM TURNOVER OR (KITS) ×2 IMPLANT
NS IRRIG 1000ML POUR BTL (IV SOLUTION) ×2 IMPLANT
PAD ARMBOARD 7.5X6 YLW CONV (MISCELLANEOUS) ×2 IMPLANT
POUCH SPECIMEN RETRIEVAL 10MM (ENDOMECHANICALS) ×2 IMPLANT
SCISSORS LAP 5X35 DISP (ENDOMECHANICALS) ×2 IMPLANT
SET CHOLANGIOGRAPH 5 50 .035 (SET/KITS/TRAYS/PACK) ×2 IMPLANT
SET IRRIG TUBING LAPAROSCOPIC (IRRIGATION / IRRIGATOR) ×2 IMPLANT
SLEEVE ENDOPATH XCEL 5M (ENDOMECHANICALS) ×2 IMPLANT
SPECIMEN JAR SMALL (MISCELLANEOUS) ×2 IMPLANT
SPONGE GAUZE 2X2 STER 10/PKG (GAUZE/BANDAGES/DRESSINGS) ×1
STRIP CLOSURE SKIN 1/2X4 (GAUZE/BANDAGES/DRESSINGS) ×2 IMPLANT
SUT MNCRL AB 4-0 PS2 18 (SUTURE) ×2 IMPLANT
TOWEL OR 17X24 6PK STRL BLUE (TOWEL DISPOSABLE) ×2 IMPLANT
TOWEL OR 17X26 10 PK STRL BLUE (TOWEL DISPOSABLE) ×2 IMPLANT
TRAY LAPAROSCOPIC MC (CUSTOM PROCEDURE TRAY) ×2 IMPLANT
TROCAR XCEL BLUNT TIP 100MML (ENDOMECHANICALS) ×2 IMPLANT
TROCAR XCEL NON-BLD 11X100MML (ENDOMECHANICALS) ×2 IMPLANT
TROCAR XCEL NON-BLD 5MMX100MML (ENDOMECHANICALS) ×2 IMPLANT
TUBING INSUFFLATION (TUBING) ×2 IMPLANT

## 2016-11-23 NOTE — Transfer of Care (Signed)
Immediate Anesthesia Transfer of Care Note  Patient: Kendra [name]  Procedure(s) Performed: Procedure(s): LAPAROSCOPIC CHOLECYSTECTOMY WITH INTRAOPERATIVE CHOLANGIOGRAM (N/A)  Patient Location: PACU  Anesthesia Type:General  Level of Consciousness: awake, alert , oriented and sedated  Airway & Oxygen Therapy: Patient Spontanous Breathing and Patient connected to nasal cannula oxygen  Post-op Assessment: Report given to RN, Post -op Vital signs reviewed and stable and Patient moving all extremities  Post vital signs: Reviewed and stable  Last Vitals:  Vitals:   11/23/16 0842  BP: 117/80  Pulse: 75  Resp: 20  Temp: 36.8 C    Last Pain:  Vitals:   11/23/16 0842  TempSrc: Oral      Patients Stated Pain Goal: 3 (69/48/54 6270)  Complications: No apparent anesthesia complications

## 2016-11-23 NOTE — Op Note (Signed)
Laparoscopic Cholecystectomy with IOC Procedure Note  Indications: This patient presents with symptomatic gallbladder disease and will undergo laparoscopic cholecystectomy.  Pre-operative Diagnosis: Chronic cholecystitis  Post-operative Diagnosis: Same  Surgeon: Keriann Rankin K.   Assistants: none  Anesthesia: General endotracheal anesthesia  ASA Class: 2  Procedure Details  The patient was seen again in the Holding Room. The risks, benefits, complications, treatment options, and expected outcomes were discussed with the patient. The possibilities of reaction to medication, pulmonary aspiration, perforation of viscus, bleeding, recurrent infection, finding a normal gallbladder, the need for additional procedures, failure to diagnose a condition, the possible need to convert to an open procedure, and creating a complication requiring transfusion or operation were discussed with the patient. The likelihood of improving the patient's symptoms with return to their baseline status is good.  The patient and/or family concurred with the proposed plan, giving informed consent. The site of surgery properly noted. The patient was taken to Operating Room, identified as [name] and the procedure verified as Laparoscopic Cholecystectomy with Intraoperative Cholangiogram. A Time Out was held and the above information confirmed.  Prior to the induction of general anesthesia, antibiotic prophylaxis was administered. General endotracheal anesthesia was then administered and tolerated well. After the induction, the abdomen was prepped with Chloraprep and draped in the sterile fashion. The patient was positioned in the supine position.  Local anesthetic agent was injected into the skin near the umbilicus and an incision made. We dissected down to the abdominal fascia with blunt dissection.  The fascia was incised vertically and we entered the peritoneal cavity bluntly.  A pursestring suture of 0-Vicryl was  placed around the fascial opening.  The Hasson cannula was inserted and secured with the stay suture.  Pneumoperitoneum was then created with CO2 and tolerated well without any adverse changes in the patient's vital signs. An 11-mm port was placed in the subxiphoid position.  Two 5-mm ports were placed in the right upper quadrant. All skin incisions were infiltrated with a local anesthetic agent before making the incision and placing the trocars.   We positioned the patient in reverse Trendelenburg, tilted slightly to the patient's left.  The gallbladder was identified, the fundus grasped and retracted cephalad. Adhesions were lysed bluntly and with the electrocautery where indicated, taking care not to injure any adjacent organs or viscus. The infundibulum was grasped and retracted laterally, exposing the peritoneum overlying the triangle of Calot. This was then divided and exposed in a blunt fashion. A critical view of the cystic duct and cystic artery was obtained.  The cystic duct was clearly identified and bluntly dissected circumferentially. The cystic duct was ligated with a clip distally.   An incision was made in the cystic duct and the [facility] cholangiogram catheter introduced. The catheter was secured using a clip. A cholangiogram was then obtained which showed good visualization of the distal and proximal biliary tree with no sign of filling defects or obstruction.  Contrast flowed easily into the duodenum. The catheter was then removed.   The cystic duct was then ligated with clips and divided. The cystic artery was identified, dissected free, ligated with clips and divided as well.   The gallbladder was dissected from the liver bed in retrograde fashion with the electrocautery. The gallbladder was removed and placed in an Endocatch sac. The liver bed was irrigated and inspected. Hemostasis was achieved with the electrocautery. Copious irrigation was utilized and was repeatedly aspirated until clear.   The gallbladder and Endocatch sac were then  removed through the umbilical port site.  The pursestring suture was used to close the umbilical fascia.    We again inspected the right upper quadrant for hemostasis.  Pneumoperitoneum was released as we removed the trocars.  4-0 Monocryl was used to close the skin.   Benzoin, steri-strips, and clean dressings were applied. The patient was then extubated and brought to the recovery room in stable condition. Instrument, sponge, and needle counts were correct at closure and at the conclusion of the case.   Findings: Cholecystitis without Cholelithiasis  Estimated Blood Loss: Minimal         Drains: none         Specimens: Gallbladder           Complications: None; patient tolerated the procedure well.         Disposition: PACU - hemodynamically stable.         Condition: stable  Imogene Burn. Georgette Dover, MD, [facility] Surgery  General/ Trauma Surgery  11/23/2016 10:29 AM

## 2016-11-23 NOTE — Anesthesia Postprocedure Evaluation (Signed)
Anesthesia Post Note  Patient: Kendra [name]  Procedure(s) Performed: Procedure(s) (LRB): LAPAROSCOPIC CHOLECYSTECTOMY WITH INTRAOPERATIVE CHOLANGIOGRAM (N/A)  Patient location during evaluation: PACU Anesthesia Type: General Level of consciousness: awake Pain management: pain level controlled Vital Signs Assessment: post-procedure vital signs reviewed and stable Respiratory status: spontaneous breathing Cardiovascular status: stable Anesthetic complications: no       Last Vitals:  Vitals:   11/23/16 1134 11/23/16 1145  BP: 124/90 139/74  Pulse: 82 85  Resp: 19   Temp:  36.5 C    Last Pain:  Vitals:   11/23/16 1145  TempSrc:   PainSc: 6                  Steph Cheadle

## 2016-11-23 NOTE — H&P (Signed)
History of Present Illness The patient is a 31 year old female who presents with abdominal pain.  PCP-Dr. Kelton Pillar GI-[facility]. [facility] Neurosurgery - Dr. Kathyrn Sheriff Neurology-Dr. Jannifer Franklin  Reason for evaluation - gallbladder disease  This is a 31 year old female who presents with a two-year history of some digestive issues. The patient develops some postprandial pain in her right upper quadrant associated with abdominal bloating as well as nausea and vomiting. This tends to occur when she has spicy or greasy foods. She also has had other issues with her bowel movements. She reports that she has seen some blood in her bowel movements. She does occasionally get some constipation. She was referred to GI for evaluation. It does not appear that an EGD or a colonoscopy have been performed. The patient had ultrasound and HIDA scan that were both negative. However the HIDA scan did show a fairly low, but normal gallbladder ejection fraction. The patient did develop nausea after ingesting the Ensure. Because of her symptoms, she is referred to discuss possible gallbladder surgery.  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 She has been seen by neurosurgery who recommends resection of the mass. However she would like to have her gallbladder removed first. The patient has had seizures. These seem to be well controlled on Keppra.  CLINICAL DATA: Right upper quadrant pain for the past year associated with nausea vomiting diarrhea and acid reflux symptoms.  EXAM: ABDOMEN ULTRASOUND COMPLETE  COMPARISON: Abdominal ultrasound of May 05, 2012  FINDINGS: Gallbladder: No gallstones or wall thickening visualized. No sonographic Murphy sign noted by sonographer.  Common bile duct: Diameter: 2.9 mm  Liver: The hepatic echotexture is normal. In the left hepatic lobe there is a hyper dense well-circumscribed solid mass measuring 2.5  x 2.3 x 3.1 cm. This has increased in size since the September 2013 study when the maximal dimension or 1.6 x 1.4 x 1.8 cm.  IVC: No abnormality visualized.  Pancreas: Visualized portion unremarkable.  Spleen: Size and appearance within normal limits.  Right Kidney: Length: 10.8 cm. Echogenicity within normal limits. No mass or hydronephrosis visualized.  Left Kidney: Length: 10.3 cm. Echogenicity within normal limits. No mass or hydronephrosis visualized.  Abdominal aorta: No aneurysm visualized.  Other findings: There is no ascites.  IMPRESSION: Hyperechoic well-marginated focus in the left hepatic lobe which has increased in size since 2013 most compatible with a hemangioma.  No gallstones or sonographic evidence of acute cholecystitis. If there are clinical concerns of chronic gallbladder dysfunction, a nuclear medicine hepatobiliary scan may be useful.   Electronically Signed By: David Martinique M.D. On: 08/03/2016 08:31  CLINICAL DATA: Right upper quadrant pain with nausea and vomiting episodes for the past year.  EXAM: NUCLEAR MEDICINE HEPATOBILIARY IMAGING WITH GALLBLADDER EF  TECHNIQUE: Sequential images of the abdomen were obtained out to 60 minutes following intravenous administration of radiopharmaceutical. After oral ingestion of Ensure, gallbladder ejection fraction was determined. At 60 min, normal ejection fraction is greater than 33%.  RADIOPHARMACEUTICALS: 5.5 mCi Tc-84m Choletec IV  COMPARISON: Abdominal ultrasound of today's date  FINDINGS: Prompt uptake and biliary excretion of activity by the liver is seen. Gallbladder activity is visualized, consistent with patency of cystic duct. Biliary activity passes into small bowel, consistent with patent common bile duct.  Calculated gallbladder ejection fraction is 42%. (Normal gallbladder ejection fraction with Ensure is greater than 33%.) the patient experienced nausea  after drinking Ensure.  IMPRESSION: No evidence of cystic duct  or common bile duct obstruction. Gallbladder ejection fraction value is toward the lower range of normal but still within the limits of normal.   Electronically Signed By: David Martinique M.D. On: 08/03/2016 13:47   Past Surgical History  Cesarean Section - Multiple Mammoplasty; Reduction Bilateral.  Diagnostic Studies History  Colonoscopy never Mammogram never Pap Smear 1-5 years ago  Allergies  Erythromycin *DERMATOLOGICALS* Anaphylaxis. Shellfish-derived Products Anaphylaxis, Hives, Swelling. ASA Buff (Mag Carb-Al Glyc) *ANALGESICS - NonNarcotic* Swelling. Iodine (Antiseptic) *ANTISEPTICS & DISINFECTANTS* Hives, Swelling. Peanut (Diagnostic) *DIAGNOSTIC PRODUCTS* Hives, Swelling.  Medication History  ALPRAZolam (0.5MG  Tablet, Oral) Active. TraMADol HCl (50MG  Tablet, Oral) Active. LevETIRAcetam (500MG  Tablet, Oral) Active. Linzess (290MCG Capsule, Oral) Active. Omeprazole (40MG  Capsule DR, Oral) Active. PredniSONE (20MG  Tablet, Oral) Active. Albuterol Sulfate (108 (90 Base)MCG/ACT Aero Pow Br Act, Inhalation) Active. Xanax (0.5MG  Tablet, Oral daily) Active. Vitamin B-12 (100MCG Tablet, Oral) Active. Benadryl (25MG  Capsule, Oral) Active. Ibuprofen (800MG  Tablet, Oral as needed) Active. Mirena (52 MG) (20MCG/24HR IUD, Intrauterine) Active. Medications Reconciled  Social History  Alcohol use Occasional alcohol use. Caffeine use Carbonated beverages, Coffee. No drug use Tobacco use Never smoker.  Family History  Family history unknown First Degree Relatives  Pregnancy / Birth History  Age at menarche 55 years. Contraceptive History Intrauterine device. Gravida 2 Irregular periods Length (months) of breastfeeding 7-12 Maternal age 15-25 Para 2  Other Problems Asthma Gastroesophageal Reflux Disease Seizure Disorder     Review of  Systems  General Not Present- Appetite Loss, Chills, Fatigue, Fever, Night Sweats, Weight Gain and Weight Loss. Skin Not Present- Change in Wart/Mole, Dryness, Hives, Jaundice, New Lesions, Non-Healing Wounds, Rash and Ulcer. HEENT Not Present- Earache, Hearing Loss, Hoarseness, Nose Bleed, Oral Ulcers, Ringing in the Ears, Seasonal Allergies, Sinus Pain, Sore Throat, Visual Disturbances, Wears glasses/contact lenses and Yellow Eyes. Respiratory Not Present- Bloody sputum, Chronic Cough, Difficulty Breathing, Snoring and Wheezing. Breast Not Present- Breast Mass, Breast Pain, Nipple Discharge and Skin Changes. Cardiovascular Not Present- Chest Pain, Difficulty Breathing Lying Down, Leg Cramps, Palpitations, Rapid Heart Rate, Shortness of Breath and Swelling of Extremities. Gastrointestinal Present- Abdominal Pain, Bloody Stool, Change in Bowel Habits, Chronic diarrhea, Constipation, Excessive gas, Indigestion, Nausea and Vomiting. Not Present- Bloating, Difficulty Swallowing, Gets full quickly at meals, Hemorrhoids and Rectal Pain. Female Genitourinary Present- Painful Urination. Not Present- Frequency, Nocturia, Pelvic Pain and Urgency. Musculoskeletal Present- Back Pain. Not Present- Joint Pain, Joint Stiffness, Muscle Pain, Muscle Weakness and Swelling of Extremities. Neurological Present- Headaches. Not Present- Decreased Memory, Fainting, Numbness, Seizures, Tingling, Tremor, Trouble walking and Weakness. Psychiatric Not Present- Anxiety, Bipolar, Change in Sleep Pattern, Depression, Fearful and Frequent crying. Endocrine Not Present- Cold Intolerance, Excessive Hunger, Hair Changes, Heat Intolerance, Hot flashes and New Diabetes. Hematology Not Present- Blood Thinners, Easy Bruising, Excessive bleeding, Gland problems, HIV and Persistent Infections.  Vitals  Weight: 226.2 lb Height: 60in Body Surface Area: 1.97 m Body Mass Index: 44.18 kg/m  Temp.: 98.25F  Pulse: 86 (Regular)   BP: 106/70 (Sitting, Left Arm, Standard)      Physical Exam   The physical exam findings are as follows: Note:WDWN in NAD Eyes: Pupils equal, round; sclera anicteric HENT: Oral mucosa moist; good dentition Neck: No masses palpated, no thyromegaly Lungs: CTA bilaterally; normal respiratory effort CV: Regular rate and rhythm; no murmurs; extremities well-perfused with no edema Abd: +bowel sounds, soft, healed incisions from lipoma excision; mild RUQ tenderness; no hernias palpated; no organomegaly Skin: Warm, dry; no sign of jaundice Psychiatric - alert and oriented  x 4; calm mood and affect    Assessment & Plan  CHRONIC CHOLECYSTITIS (K81.1)  Current Plans Schedule for Surgery - Laparoscopic cholecystectomy with intraoperative cholangiogram. The surgical procedure has been discussed with the patient. Potential risks, benefits, alternative treatments, and expected outcomes have been explained. All of the patient's questions at this time have been answered. The likelihood of reaching the patient's treatment goal is good. The patient understand the proposed surgical procedure and wishes to proceed  The patient would like to have her surgery performed prior to her craniotomy.    Imogene Burn. Georgette Dover, MD, [facility]  General/ Trauma Surgery  11/23/2016 7:21 AM

## 2016-11-23 NOTE — Discharge Instructions (Signed)
[facility] ______[facility] °LAPAROSCOPIC SURGERY: POST OP INSTRUCTIONS °Always review your discharge instruction sheet given to you by the facility where your surgery was performed. °IF YOU HAVE DISABILITY OR FAMILY LEAVE FORMS, YOU MUST BRING THEM TO THE OFFICE FOR PROCESSING.   °DO NOT GIVE THEM TO YOUR DOCTOR. ° °1. A prescription for pain medication may be given to you upon discharge.  Take your pain medication as prescribed, if needed.  If narcotic pain medicine is not needed, then you may take acetaminophen (Tylenol) or ibuprofen (Advil) as needed. °2. Take your usually prescribed medications unless otherwise directed. °3. If you need a refill on your pain medication, please contact your pharmacy.  They will contact our office to request authorization. Prescriptions will not be filled after 5pm or on week-ends. °4. You should follow a light diet the first few days after arrival home, such as soup and crackers, etc.  Be sure to include lots of fluids daily. °5. Most patients will experience some swelling and bruising in the area of the incisions.  Ice packs will help.  Swelling and bruising can take several days to resolve.  °6. It is common to experience some constipation if taking pain medication after surgery.  Increasing fluid intake and taking a stool softener (such as Colace) will usually help or prevent this problem from occurring.  A mild laxative (Milk of Magnesia or Miralax) should be taken according to package instructions if there are no bowel movements after 48 hours. °7. Unless discharge instructions indicate otherwise, you may remove your bandages 24-48 hours after surgery, and you may shower at that time.  You may have steri-strips (small skin tapes) in place directly over the incision.  These strips should be left on the skin for 7-10 days.  If your surgeon used skin glue on the incision, you may shower in 24 hours.  The glue will flake off over the next 2-3 weeks.  Any sutures or  staples will be removed at the office during your follow-up visit. °8. ACTIVITIES:  You may resume regular (light) daily activities beginning the next day--such as daily self-care, walking, climbing stairs--gradually increasing activities as tolerated.  You may have sexual intercourse when it is comfortable.  Refrain from any heavy lifting or straining until approved by your doctor. °a. You may drive when you are no longer taking prescription pain medication, you can comfortably wear a seatbelt, and you can safely maneuver your car and apply brakes. °b. RETURN TO WORK:  __________________________________________________________ °9. You should see your doctor in the office for a follow-up appointment approximately 2-3 weeks after your surgery.  Make sure that you call for this appointment within a day or two after you arrive home to insure a convenient appointment time. °10. OTHER INSTRUCTIONS: __________________________________________________________________________________________________________________________ __________________________________________________________________________________________________________________________ °WHEN TO CALL YOUR DOCTOR: °1. Fever over 101.0 °2. Inability to urinate °3. Continued bleeding from incision. °4. Increased pain, redness, or drainage from the incision. °5. Increasing abdominal pain ° °The clinic staff is available to answer your questions during regular business hours.  Please don’t hesitate to call and ask to speak to one of the nurses for clinical concerns.  If you have a medical emergency, go to the nearest emergency room or call 911.  A surgeon from [facility] is always on call at the hospital. °[address], [address], [place], [place]  [address] ? [address] [address], [place], [place]   [address] °[phone] ? [phone] ? FAX [phone] °Web site:   [email or web address] °

## 2016-11-23 NOTE — Anesthesia Procedure Notes (Addendum)
Procedure Name: Intubation Date/Time: 11/23/2016 9:33 AM Performed by: Scheryl Darter Pre-anesthesia Checklist: Patient identified, Emergency Drugs available, Suction available and Patient being monitored Patient Re-evaluated:Patient Re-evaluated prior to inductionOxygen Delivery Method: Circle System Utilized Preoxygenation: Pre-oxygenation with 100% oxygen Intubation Type: IV induction, Cricoid Pressure applied and Rapid sequence Ventilation: Mask ventilation without difficulty Laryngoscope Size: 2 Grade View: Grade I Tube type: Oral Tube size: 7.5 mm Number of attempts: 1 Airway Equipment and Method: Stylet and Oral airway Placement Confirmation: ETT inserted through vocal cords under direct vision,  positive ETCO2 and breath sounds checked- equal and bilateral Secured at: 22 cm Tube secured with: Tape Dental Injury: Teeth and Oropharynx as per pre-operative assessment

## 2016-11-23 NOTE — Anesthesia Preprocedure Evaluation (Signed)
Anesthesia Evaluation  Patient identified by MRN, date of birth, ID band Patient awake    Reviewed: Allergy & Precautions, NPO status , Patient's Chart, lab work & pertinent test results  Airway Mallampati: II  TM Distance: >3 FB     Dental   Pulmonary asthma ,    breath sounds clear to auscultation       Cardiovascular negative cardio ROS   Rhythm:Regular Rate:Normal     Neuro/Psych  Headaches, Seizures -,     GI/Hepatic negative GI ROS, Neg liver ROS,   Endo/Other  negative endocrine ROS  Renal/GU negative Renal ROS     Musculoskeletal   Abdominal   Peds  Hematology   Anesthesia Other Findings   Reproductive/Obstetrics                             Anesthesia Physical Anesthesia Plan  ASA: III  Anesthesia Plan: General   Post-op Pain Management:    Induction: Intravenous  Airway Management Planned: Oral ETT  Additional Equipment:   Intra-op Plan:   Post-operative Plan: Extubation in OR  Informed Consent: I have reviewed the patients History and Physical, chart, labs and discussed the procedure including the risks, benefits and alternatives for the proposed anesthesia with the patient or authorized representative who has indicated his/her understanding and acceptance.   Dental advisory given  Plan Discussed with: CRNA and Anesthesiologist  Anesthesia Plan Comments:         Anesthesia Quick Evaluation

## 2016-11-23 NOTE — Anesthesia Procedure Notes (Deleted)
Performed by: Tennis Mckinnon E     

## 2016-11-24 ENCOUNTER — Encounter (HOSPITAL_COMMUNITY): Payer: Self-pay | Admitting: Surgery

## 2016-12-02 ENCOUNTER — Ambulatory Visit (HOSPITAL_COMMUNITY)
Admission: RE | Admit: 2016-12-02 | Discharge: 2016-12-02 | Disposition: A | Discharge status: Home / Self Care | Payer: 59 | Source: Ambulatory Visit | Attending: Neurosurgery | Admitting: Neurosurgery

## 2016-12-02 DIAGNOSIS — D320 Benign neoplasm of cerebral meninges: Secondary | ICD-10-CM | POA: Diagnosis not present

## 2016-12-02 DIAGNOSIS — D329 Benign neoplasm of meninges, unspecified: Secondary | ICD-10-CM | POA: Insufficient documentation

## 2016-12-02 MED ORDER — GADOBENATE DIMEGLUMINE 529 MG/ML IV SOLN
20.0000 mL | Freq: Once | INTRAVENOUS | Status: AC | PRN
  Administered 2016-12-02: 20 mL via INTRAVENOUS

## 2016-12-03 ENCOUNTER — Telehealth: Payer: Self-pay | Admitting: Neurology

## 2016-12-03 ENCOUNTER — Emergency Department (HOSPITAL_COMMUNITY)
Admission: EM | Admit: 2016-12-03 | Discharge: 2016-12-03 | Disposition: A | Discharge status: Home / Self Care | Payer: 59 | Attending: Emergency Medicine | Admitting: Emergency Medicine

## 2016-12-03 ENCOUNTER — Emergency Department (HOSPITAL_COMMUNITY): Payer: 59

## 2016-12-03 DIAGNOSIS — Z86011 Personal history of benign neoplasm of the brain: Secondary | ICD-10-CM | POA: Diagnosis not present

## 2016-12-03 DIAGNOSIS — M542 Cervicalgia: Secondary | ICD-10-CM | POA: Diagnosis not present

## 2016-12-03 DIAGNOSIS — R51 Headache: Secondary | ICD-10-CM | POA: Insufficient documentation

## 2016-12-03 DIAGNOSIS — Y92009 Unspecified place in unspecified non-institutional (private) residence as the place of occurrence of the external cause: Secondary | ICD-10-CM | POA: Insufficient documentation

## 2016-12-03 DIAGNOSIS — S0993XA Unspecified injury of face, initial encounter: Secondary | ICD-10-CM | POA: Diagnosis present

## 2016-12-03 DIAGNOSIS — R569 Unspecified convulsions: Secondary | ICD-10-CM | POA: Insufficient documentation

## 2016-12-03 DIAGNOSIS — Y939 Activity, unspecified: Secondary | ICD-10-CM | POA: Insufficient documentation

## 2016-12-03 DIAGNOSIS — J45909 Unspecified asthma, uncomplicated: Secondary | ICD-10-CM | POA: Insufficient documentation

## 2016-12-03 DIAGNOSIS — Y999 Unspecified external cause status: Secondary | ICD-10-CM | POA: Diagnosis not present

## 2016-12-03 DIAGNOSIS — X58XXXA Exposure to other specified factors, initial encounter: Secondary | ICD-10-CM | POA: Diagnosis not present

## 2016-12-03 DIAGNOSIS — S00512A Abrasion of oral cavity, initial encounter: Secondary | ICD-10-CM | POA: Insufficient documentation

## 2016-12-03 DIAGNOSIS — Z91010 Allergy to peanuts: Secondary | ICD-10-CM | POA: Diagnosis not present

## 2016-12-03 DIAGNOSIS — T781XXA Other adverse food reactions, not elsewhere classified, initial encounter: Secondary | ICD-10-CM | POA: Diagnosis not present

## 2016-12-03 MED ORDER — HYDROCODONE-ACETAMINOPHEN 5-325 MG PO TABS
1.0000 | ORAL_TABLET | Freq: Once | ORAL | Status: AC
  Administered 2016-12-03: 1 via ORAL
  Filled 2016-12-03: qty 1

## 2016-12-03 MED ORDER — LAMOTRIGINE 100 MG PO TABS: 100.0000 mg | ORAL_TABLET | Freq: Two times a day (BID) | ORAL | 3 refills | Status: DC

## 2016-12-03 MED ORDER — FAMOTIDINE 20 MG PO TABS
20.0000 mg | ORAL_TABLET | Freq: Once | ORAL | Status: AC
  Administered 2016-12-03: 20 mg via ORAL
  Filled 2016-12-03: qty 1

## 2016-12-03 MED ORDER — LAMOTRIGINE 100 MG PO TABS
100.0000 mg | ORAL_TABLET | Freq: Once | ORAL | Status: AC
  Administered 2016-12-03: 100 mg via ORAL
  Filled 2016-12-03: qty 1

## 2016-12-03 NOTE — ED Provider Notes (Signed)
[place] [facility] DEPT Provider Note   CSN: [id number] Arrival date & time: 12/03/16  0855     History   Chief Complaint Chief Complaint  Patient presents with  . Seizures    HPI Kendra [name] is a 31 y.o. female.  HPI 31 year old African American female past medical history significant for seizures, migraines, meningioma who presents to the ED today with complaints of a seizure. Patient does have history of meningioma and is followed by neurology and neurosurgery. Is scheduled for resection next week. Patient has had increase in seizures over the past month. Has been attributed to the meningioma which is why they are resecting it to hopefully improve her seizures. Patient presents by EMS with mom at bedside. Patient is a poor historian given that she is not aware of what happened this morning. Mom states that she woke up this morning and heard a noise in the bathroom. Went to the bathroom and was unable to open the door as the patient was leaning against the door on the floor. She was postictal with eyes open but unaware of her surroundings. Patient thinks that she might have hit her head because she has a headache and neck pain but is unsure. Unsure for how long she was down for. Patient is on seizure medications and takes her medication regularly. She denies any new drug use. Patient complains of sore to her tongue. Denies any incontinence. Mom states the patient is currently acting at baseline. Per EMS report when they arrived patient was postictal. She has since returned to baseline. She denies fever, chills, chest pain, shortness of breath, back pain, urinary symptoms slurred speech, visual disturbance, or any other associated symptoms.  Past Medical History:  Diagnosis Date  . Asthma   . Convulsions/seizures ([place]) 07/27/2016  . H/O: C-section   . Lipoma   . Lipoma of LUQ abdomen, s/p removal 06/27/2012 05/24/2012  . Meningioma ([place]) 07/27/2016   Anterior fossa    Patient Active  Problem List   Diagnosis Date Noted  . Convulsions/seizures ([place]) 07/27/2016  . Meningioma ([place]) 07/27/2016  . Common migraine with intractable migraine 07/27/2016  . Status post repeat low transverse cesarean section 01/14/2015  . Lipoma of LUQ abdomen, s/p removal 06/27/2012 05/24/2012  . 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 Chlamydia infection 12/13/2011  . 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 BREAST SURGERY  12/30/11   breast reduction  . CESAREAN SECTION  09/09/2010   [place]  . CESAREAN SECTION N/A 01/13/2015   Procedure: CESAREAN SECTION;  Surgeon: Janyth Pupa, DO;  Location: [facility];  Service: Obstetrics;  Laterality: N/A;  EDD 01/18/15 would like [place] dressing  . CHOLECYSTECTOMY N/A 11/23/2016   Procedure: LAPAROSCOPIC CHOLECYSTECTOMY WITH INTRAOPERATIVE CHOLANGIOGRAM;  Surgeon: Donnie Mesa, MD;  Location: [facility];  Service: General;  Laterality: N/A;  . LYMPH NODE DISSECTION  2013   Abdominal    OB History    Gravida Para Term Preterm AB Living   2 2 2     2    SAB TAB Ectopic Multiple Live Births         0 1       Home Medications    Prior to Admission medications   Medication Sig Start Date End Date Taking? Authorizing Provider  acetaminophen (TYLENOL) 500 MG tablet Take 1,000 mg by mouth every 6 (six) hours as needed (for pain/headaches.).   Yes Historical Provider, MD  gabapentin (  NEURONTIN) 100 MG capsule Take 1 capsule (100 mg total) by mouth 3 (three) times daily. Patient taking differently: Take 100 mg by mouth 2 (two) times daily.  10/21/16  Yes Kathrynn Ducking, MD  lamoTRIgine (LAMICTAL) 25 MG tablet Take 3 tablets (75 mg total) by mouth 2 (two) times daily. Patient taking differently: Take 50 mg by mouth 2 (two) times daily.  10/21/16  Yes Kathrynn Ducking, MD  levETIRAcetam (KEPPRA) 500 MG tablet Take 1 tablet (500 mg total) by mouth 2 (two) times daily. 07/13/16  Yes Kristen  N Ward, DO  levonorgestrel (MIRENA) 20 MCG/24HR IUD 1 each by Intrauterine route once.   Yes Historical Provider, MD  LINZESS 290 MCG CAPS capsule Take 290 mcg by mouth daily as needed (constipation).  07/16/16  Yes Historical Provider, MD  albuterol (PROVENTIL HFA;VENTOLIN HFA) 108 (90 BASE) MCG/ACT inhaler Inhale 2 puffs into the lungs every 6 (six) hours as needed for wheezing or shortness of breath.    Historical Provider, MD  diphenhydrAMINE (BENADRYL) 25 mg capsule Take 1 capsule (25 mg total) by mouth every 6 (six) hours as needed. Patient not taking: Reported on 12/03/2016 07/17/16   Courteney Lyn Mackuen, MD  EPINEPHrine 0.3 mg/0.3 mL IJ SOAJ injection Inject 0.3 mg into the muscle daily as needed (for anaphylatic allergic reactions).     Historical Provider, MD  HYDROcodone-acetaminophen (NORCO/VICODIN) 5-325 MG tablet Take 1 tablet by mouth every 4 (four) hours as needed. Patient not taking: Reported on 12/02/2016 11/15/16   Isla Pence, MD  HYDROcodone-acetaminophen (NORCO/VICODIN) 5-325 MG tablet Take 1-2 tablets by mouth every 6 (six) hours as needed for moderate pain. Patient not taking: Reported on 12/02/2016 11/23/16   Donnie Mesa, MD  ondansetron (ZOFRAN ODT) 4 MG disintegrating tablet Take 1 tablet (4 mg total) by mouth every 8 (eight) hours as needed. Patient not taking: Reported on 12/02/2016 11/15/16   Isla Pence, MD    Family History Family History  Problem Relation Age of Onset  . Kidney disease Mother     kidney stones  . Cancer Maternal Grandmother     breast  . Cancer Maternal Grandfather     Colon  . Deep vein thrombosis Maternal Grandfather     in throat  . Cancer Maternal Aunt     breast  . Heart disease Maternal Uncle   . Stroke Maternal Uncle   . Liver cancer Maternal Uncle   . Diabetes Paternal Grandfather     Social History Social History  Substance Use Topics  . Smoking status: Never Smoker  . Smokeless tobacco: Never Used  . Alcohol use No      Allergies   Asa buff (mag [buffered aspirin]; Erythromycin; Iodine; Peanut-containing drug products; and Shellfish allergy   Review of Systems Review of Systems  Constitutional: Negative for chills and fever.  HENT: Negative for congestion.   Eyes: Negative for visual disturbance.  Respiratory: Negative for cough and shortness of breath.   Cardiovascular: Negative for chest pain.  Gastrointestinal: Negative for diarrhea, nausea and vomiting.  Genitourinary: Negative for dysuria, flank pain, frequency and urgency.  Musculoskeletal: Positive for neck pain. Negative for back pain.  Skin: Negative for wound.  Neurological: Positive for seizures and headaches. Negative for dizziness, syncope, facial asymmetry, weakness and light-headedness.     Physical Exam Updated Vital Signs BP 119/69   Pulse 75   Temp 99 F (37.2 C) (Oral)   Resp 20   LMP 11/15/2016 (Exact Date)   SpO2  97%   Physical Exam  Constitutional: She is oriented to person, place, and time. She appears well-developed and well-nourished. No distress.  9 patient resting comfortably on the bed in no acute distress.  HENT:  Head: Normocephalic and atraumatic.  Mouth/Throat: Oropharynx is clear and moist.  Small abrasion noted to the right side of the tongue. Bleeding is controlled.  Do not appreciate any scalp hematomas or abrasions. No tenderness to palpation. No bilateral hemotympanum. No septal hematoma.  Eyes: Conjunctivae and EOM are normal. Pupils are equal, round, and reactive to light. Right eye exhibits no discharge. Left eye exhibits no discharge. No scleral icterus.  Neck: Normal range of motion. Neck supple. No thyromegaly present.  Patient does have midline C-spine tenderness noted. Pulse noted. Full range of motion.  Cardiovascular: Normal rate, regular rhythm, normal heart sounds and intact distal pulses.   Pulmonary/Chest: Effort normal and breath sounds normal.  Abdominal: Soft. Bowel sounds are  normal. She exhibits no distension. There is no tenderness.  Musculoskeletal: Normal range of motion.  No midline L-spine or T-spine tenderness. No deformity step-offs noted. Full range of motion.  Lymphadenopathy:    She has no cervical adenopathy.  Neurological: She is alert and oriented to person, place, and time.  The patient is alert, attentive, and oriented x 3. Speech is clear. Cranial nerve II-VII grossly intact. Negative pronator drift. Sensation intact. Strength 5/5 in all extremities. Reflexes 2+ and symmetric at biceps, triceps, knees, and ankles. Rapid alternating movement and fine finger movements intact.   Denies S gait or Romberg due to patient in seizure precautions.   Skin: Skin is warm and dry. Capillary refill takes less than 2 seconds.  Nursing note and vitals reviewed.    ED Treatments / Results  Labs (all labs ordered are listed, but only abnormal results are displayed) Labs Reviewed - No data to display  EKG  EKG Interpretation  Date/Time:  Friday December 03 2016 09:10:59 EDT Ventricular Rate:  98 PR Interval:    QRS Duration: 72 QT Interval:  318 QTC Calculation: 406 R Axis:   56 Text Interpretation:  Sinus rhythm Low voltage, precordial leads No significant change since last tracing Confirmed by KNAPP  MD-J, JON ([id number]) on 12/03/2016 9:23:15 AM       Radiology Ct Head Wo Contrast  Result Date: 12/03/2016 CLINICAL DATA:  Headache and neck pain following seizure EXAM: CT HEAD WITHOUT CONTRAST CT CERVICAL SPINE WITHOUT CONTRAST TECHNIQUE: Multidetector CT imaging of the head and cervical spine was performed following the standard protocol without intravenous contrast. Multiplanar CT image reconstructions of the cervical spine were also generated. COMPARISON:  Head CT November 15, 2016; brain MRI December 02, 2016 FINDINGS: CT HEAD FINDINGS Brain: The partially calcified planum sphenoidale region meningioma is unchanged compared to recent prior studies. This  meningioma measures 2.4 x 3.4 x 2.3 cm. There is stable edema in the right frontal lobe, presumably resulting from this meningioma. There is calcification lateral to the cell on the left, felt to represent a portion of the sizable meningioma involving the floor of the anterior cranial fossa. There is no new edema. No new mass is evident. There is no evidence of hemorrhage. There is no subdural or epidural fluid collection. No midline shift. Elsewhere, the gray-white compartments appear normal. No acute infarct is appreciable. Vascular: No hyperdense vessel. No vascular calcifications are evident. Skull: The bony calvarium appears intact. Sinuses/Orbits: There is opacification in several ethmoid air cells bilaterally. There is patchy opacity in  each sphenoid sinus. Other visualized paranasal sinuses are clear. Orbits appear symmetric bilaterally. Other: Mastoid air cells are clear. CT CERVICAL SPINE FINDINGS Alignment: There is no spondylolisthesis. Skull base and vertebrae: Skull base and craniocervical junction regions appear normal. There is no evident fracture. No blastic or lytic bone lesions. Slight bony overgrowth along the inferior odontoid is felt to represent an anatomic variant. Soft tissues and spinal canal: Prevertebral soft tissues and predental space regions appear unremarkable. There are no paraspinous lesions. There is no demonstrable cord or canal hematoma. Disc levels: Disc spaces appear unremarkable. There is no nerve root edema or effacement. No disc extrusion or stenosis. Upper chest: Visualized upper lung zones appear normal. Other: None IMPRESSION: CT head: No change in the appearance and distribution of the partially calcified planum sphenoidale meningioma. Edema in the right frontal lobe is stable. No new edema. No new mass. No hemorrhage or acute infarct evident. No extra-axial fluid collection or midline shift. There are areas of paranasal sinus disease. CT cervical spine: No fracture or  spondylolisthesis. No evident arthropathy. Electronically Signed   By: Lowella Grip III M.D.   On: 12/03/2016 11:51   Ct Cervical Spine Wo Contrast  Result Date: 12/03/2016 CLINICAL DATA:  Headache and neck pain following seizure EXAM: CT HEAD WITHOUT CONTRAST CT CERVICAL SPINE WITHOUT CONTRAST TECHNIQUE: Multidetector CT imaging of the head and cervical spine was performed following the standard protocol without intravenous contrast. Multiplanar CT image reconstructions of the cervical spine were also generated. COMPARISON:  Head CT November 15, 2016; brain MRI December 02, 2016 FINDINGS: CT HEAD FINDINGS Brain: The partially calcified planum sphenoidale region meningioma is unchanged compared to recent prior studies. This meningioma measures 2.4 x 3.4 x 2.3 cm. There is stable edema in the right frontal lobe, presumably resulting from this meningioma. There is calcification lateral to the cell on the left, felt to represent a portion of the sizable meningioma involving the floor of the anterior cranial fossa. There is no new edema. No new mass is evident. There is no evidence of hemorrhage. There is no subdural or epidural fluid collection. No midline shift. Elsewhere, the gray-white compartments appear normal. No acute infarct is appreciable. Vascular: No hyperdense vessel. No vascular calcifications are evident. Skull: The bony calvarium appears intact. Sinuses/Orbits: There is opacification in several ethmoid air cells bilaterally. There is patchy opacity in each sphenoid sinus. Other visualized paranasal sinuses are clear. Orbits appear symmetric bilaterally. Other: Mastoid air cells are clear. CT CERVICAL SPINE FINDINGS Alignment: There is no spondylolisthesis. Skull base and vertebrae: Skull base and craniocervical junction regions appear normal. There is no evident fracture. No blastic or lytic bone lesions. Slight bony overgrowth along the inferior odontoid is felt to represent an anatomic variant. Soft  tissues and spinal canal: Prevertebral soft tissues and predental space regions appear unremarkable. There are no paraspinous lesions. There is no demonstrable cord or canal hematoma. Disc levels: Disc spaces appear unremarkable. There is no nerve root edema or effacement. No disc extrusion or stenosis. Upper chest: Visualized upper lung zones appear normal. Other: None IMPRESSION: CT head: No change in the appearance and distribution of the partially calcified planum sphenoidale meningioma. Edema in the right frontal lobe is stable. No new edema. No new mass. No hemorrhage or acute infarct evident. No extra-axial fluid collection or midline shift. There are areas of paranasal sinus disease. CT cervical spine: No fracture or spondylolisthesis. No evident arthropathy. Electronically Signed   By: Lowella Grip III M.D.  On: 12/03/2016 11:51   Mr Jeri Cos QQ Contrast  Result Date: 12/02/2016 CLINICAL DATA:  Pre-surgical planning for meningioma. EXAM: MRI HEAD WITHOUT AND WITH CONTRAST TECHNIQUE: Multiplanar, multiecho pulse sequences of the brain and surrounding structures were obtained without and with intravenous contrast. CONTRAST:  31mL MULTIHANCE GADOBENATE DIMEGLUMINE 529 MG/ML IV SOLN COMPARISON:  MRI of the head November 15, 2016 FINDINGS: INTRACRANIAL CONTENTS: 2.4 x 3.4 x 2.3 cm (transverse by AP by CC) low T2, homogeneously enhancing meningioma within the floor the anterior cranial fossa. The mass expands the RIGHT olfactory groove. Dural tail contiguous with the anterior sella. Superior displacement and flattening of the LEFT optic nerve cisternal segment with partial encasement. Hyperostotic planum sphenoidale. Overlying RIGHT frontal lobe vasogenic edema without parenchymal enhancement. Local mass effect without midline shift. Ventricles and sulci are overall normal for patient's age. No reduced diffusion to suggest acute ischemia. No susceptibility artifact to suggest hemorrhage. VASCULAR: Normal  major intracranial vascular flow voids present at skull base. SKULL AND UPPER CERVICAL SPINE: Empty sella. No suspicious calvarial bone marrow signal. Craniocervical junction maintained. SINUSES/ORBITS: Trace paranasal sinus mucosal thickening. Mastoid air cells are well aerated. The included ocular globes and orbital contents are non-suspicious. OTHER: None. IMPRESSION: Stable appearance of 2.4 x 3.4 x 2.3 cm planum sphenoidale meningioma with RIGHT frontal lobe edema, no parenchymal invasion. Partial encasement LEFT optic nerve cisternal segment. Otherwise normal MRI of the brain with and without contrast. Electronically Signed   By: Elon Alas M.D.   On: 12/02/2016 21:46    Procedures Procedures (including critical care time)  Medications Ordered in ED Medications  HYDROcodone-acetaminophen (NORCO/VICODIN) 5-325 MG per tablet 1 tablet (1 tablet Oral Given 12/03/16 1039)     Initial Impression / Assessment and Plan / ED Course  I have reviewed the triage vital signs and the nursing notes.  Pertinent labs & imaging results that were available during my care of the patient were reviewed by me and considered in my medical decision making (see chart for details).    Pt presents to the ED after seizure at home. PT has has increased frequency in seizures due to benign meningioma which will be resected next week which is likely causing her seizure. Pt did hit her head and complains of neck pain. CT of head and neck without acute changes. Patient with no evidence of focal neuro deficits on physical exam and is at mental baseline.  imaging have been reviewed. Spoke with Dr. Jannifer Franklin with neurology who recommends increasing lamictal to 100 BID. HE will call in rx.. Follow-up as needed. Patient was given 100 mg of Lamictal in the ED today. She is asking for some for acid reflux. We'll give her Pepcid. Will observe pt to make sure that she does not have recurrent seizure. Feel that pt can be discharged  home. Care handoff to [place]. Pt discussed and seen by DR. Tomi Bamberger who is agreeable to the above plan.  Final Clinical Impressions(s) / ED Diagnoses   Final diagnoses:  Seizure [facility])    New Prescriptions New Prescriptions   No medications on file     Doristine Devoid, PA-C 12/06/16 2252    Kendra Rank, MD 12/08/16 2105

## 2016-12-03 NOTE — Discharge Instructions (Signed)
You need to take lamictal 100mg , 2 x daily  Get help right away if: You have a seizure that does not stop after 5 minutes. You have several seizures in a row without a complete recovery in between seizures. You have a seizure that makes it harder to breathe. You have a seizure that is different from previous seizures. You have a seizure that leaves you unable to speak or use a part of your body. You did not wake up immediately after a seizure.   Department of Motor Vehicle [facility]) of [place] regulations for seizures - It is the patient?s responsibility to report the incidence of the seizure in the state of [place]. [place] has no statutory provision requiring physicians to report patients diagnosed with epilepsy or seizures to a central state agency.   The recommended DMV regulation requirement for a driver in [place] for an individual with a seizure is that they be seizure-free for 6-12 months. However, the DMV may consider the following exceptions to this general rule where: (1) a physician-directed change in medication causes a seizure and the individual immediately resumes the previous therapy which controlled seizures; (2) there is a history of nocturnal seizures or seizures which do not involve loss of consciousness, loss of control of motor function, or loss of appropriate sensation and information process; and (3) an individual has a seizure disorder preceded by an aura (warning) lasting 2-3 minutes. While the [facility] may also give consideration to other unusual circumstances which may affect the general requirement that drivers be seizure-free for 6-12 months, interpretation of these circumstances and assignment of restrictions is at the discretion of the Medical Advisor. The DMV also considers compliance with medical therapy essential for safe driving. [facility] Physician's Guide to [employer] (June, 1995 ed.)] The Department learns of an individual's condition by inquiring on the  application form or renewal form, a physician's report to the [facility], an accident report or from correspondence from the individual. The person may be required to submit a Medical Report Form either annually or semi-annually.

## 2016-12-03 NOTE — Telephone Encounter (Signed)
The patient went to the emergency room after seizures today. She is on Lamictal taking 75 mg twice daily, she is off the Keppra as she could not tolerate the medication. She will be having resection of her meningioma next week. I will go up on the Lamictal taking 100 mg twice daily.

## 2016-12-03 NOTE — ED Provider Notes (Signed)
Patient taken in sign out from [place]. Patient here for seizures  With hx of benign meningioma and increase in frequency of her seizures. Patient is scheduled for a resection Thursday next week (12/10/2015). Patient is currently on 75 BID. 3 in the past month.  Awaiting call back from Dr. Jannifer Franklin about ? Med changes   Dr. Jannifer Franklin asks for lamictal 100 BID. Patient will be discharged to follow up with Dr. Jannifer Franklin in 1 week.  The patient appears reasonably screened and/or stabilized for discharge and I doubt any other medical condition or other [facility] requiring further screening, evaluation, or treatment in the ED at this time prior to discharge.    Margarita Mail, PA-C 12/03/16 1622    Dorie Rank, MD 12/05/16 [phone]

## 2016-12-03 NOTE — ED Triage Notes (Addendum)
Pt in from home via [facility], per report pt was at home in bathroom and her mother reported hearing noise in the bathroom, no noted seizure activity by mother per EMS, EMS reports pt was postictal upon their arrival, pt does not recall events up what happened, no tongue injury or incontinence noted, pt has brain tumor, pt A&O x4, hx of seizures, pt takes Gabapentin & Lamictal, pt has no obvious head injury & MAE

## 2016-12-03 NOTE — Telephone Encounter (Signed)
Please refer to telephone note from this date. 

## 2016-12-03 NOTE — Telephone Encounter (Signed)
Dorothea Ogle (PA) with [facility] called office requesting to speak with Dr. Jannifer Franklin patient at ER now.  Please call [phone]

## 2016-12-06 NOTE — Pre-Procedure Instructions (Signed)
Dreanna BELLA BRUMMET  12/06/2016      [place], [place], [place] [address] Phone: [phone] Fax: [phone]  [facility] [place], [place] [address] Phone: [phone] Fax: [phone]    Your procedure is scheduled on Friday April 13.  Report to [facility] Admitting at 8:00 A.M.  Call this number if you have problems the morning of surgery:  [phone]   Remember:  Do not eat food or drink liquids after midnight.  Take these medicines the morning of surgery with A SIP OF WATER: gabapentin (neurontin), lamotrigine (lamictal), levetiracetam (Keppra), acetaminophen (tylenol) if needed, albuterol (please bring inhaler to hospital with you  Take all other medications as prescribed except 7 days prior to surgery STOP taking any Aspirin, Aleve, Naproxen, Ibuprofen, Motrin, Advil, Goody's, BC's, all herbal medications, fish oil, and all vitamins    Do not wear jewelry, make-up or nail polish.  Do not wear lotions, powders, or perfumes, or deoderant.  Do not shave 48 hours prior to surgery.  Men may shave face and neck.  Do not bring valuables to the hospital.  [facility] is not responsible for any belongings or valuables.  Contacts, dentures or bridgework may not be worn into surgery.  Leave your suitcase in the car.  After surgery it may be brought to your room.  For patients admitted to the hospital, discharge time will be determined by your treatment team.  Patients discharged the day of surgery will not be allowed to drive home.  Special instructions:    [place]- Preparing For Surgery  Before surgery, you can play an important role. Because skin is not sterile, your skin needs to be as free of germs as possible. You can reduce the number of germs on your skin by  washing with CHG (chlorahexidine gluconate) Soap before surgery.  CHG is an antiseptic cleaner which kills germs and bonds with the skin to continue killing germs even after washing.  Please do not use if you have an allergy to CHG or antibacterial soaps. If your skin becomes reddened/irritated stop using the CHG.  Do not shave (including legs and underarms) for at least 48 hours prior to first CHG shower. It is OK to shave your face.  Please follow these instructions carefully.   1. Shower the NIGHT BEFORE SURGERY and the MORNING OF SURGERY with CHG.   2. If you chose to wash your hair, wash your hair first as usual with your normal shampoo.  3. After you shampoo, rinse your hair and body thoroughly to remove the shampoo.  4. Use CHG as you would any other liquid soap. You can apply CHG directly to the skin and wash gently with a scrungie or a clean washcloth.   5. Apply the CHG Soap to your body ONLY FROM THE NECK DOWN.  Do not use on open wounds or open sores. Avoid contact with your eyes, ears, mouth and genitals (private parts). Wash genitals (private parts) with your normal soap.  6. Wash thoroughly, paying special attention to the area where your surgery will be performed.  7. Thoroughly rinse your body with warm water from the neck down.  8. DO NOT shower/wash with your normal soap after using and rinsing off the CHG Soap.  9. Pat yourself  dry with a CLEAN TOWEL.   10. Wear CLEAN PAJAMAS   11. Place CLEAN SHEETS on your bed the night of your first shower and DO NOT SLEEP WITH PETS.    Day of Surgery: Do not apply any deodorants/lotions. Please wear clean clothes to the hospital/surgery center.      Please read over the following fact sheets that you were given. Coughing and Deep Breathing

## 2016-12-07 ENCOUNTER — Other Ambulatory Visit (HOSPITAL_COMMUNITY): Payer: 59

## 2016-12-07 ENCOUNTER — Encounter (HOSPITAL_COMMUNITY)
Admission: RE | Admit: 2016-12-07 | Discharge: 2016-12-07 | Disposition: A | Discharge status: Home / Self Care | Payer: 59 | Source: Ambulatory Visit | Attending: Neurosurgery | Admitting: Neurosurgery

## 2016-12-07 ENCOUNTER — Encounter (HOSPITAL_COMMUNITY): Payer: Self-pay

## 2016-12-07 DIAGNOSIS — D329 Benign neoplasm of meninges, unspecified: Secondary | ICD-10-CM

## 2016-12-07 DIAGNOSIS — Z01818 Encounter for other preprocedural examination: Secondary | ICD-10-CM

## 2016-12-07 DIAGNOSIS — R569 Unspecified convulsions: Secondary | ICD-10-CM | POA: Diagnosis not present

## 2016-12-07 DIAGNOSIS — Z86011 Personal history of benign neoplasm of the brain: Secondary | ICD-10-CM | POA: Diagnosis not present

## 2016-12-07 DIAGNOSIS — Z6841 Body Mass Index (BMI) 40.0 and over, adult: Secondary | ICD-10-CM | POA: Diagnosis not present

## 2016-12-07 DIAGNOSIS — Z9049 Acquired absence of other specified parts of digestive tract: Secondary | ICD-10-CM | POA: Diagnosis not present

## 2016-12-07 DIAGNOSIS — F17200 Nicotine dependence, unspecified, uncomplicated: Secondary | ICD-10-CM | POA: Diagnosis not present

## 2016-12-07 DIAGNOSIS — Z975 Presence of (intrauterine) contraceptive device: Secondary | ICD-10-CM | POA: Diagnosis not present

## 2016-12-07 DIAGNOSIS — J45909 Unspecified asthma, uncomplicated: Secondary | ICD-10-CM | POA: Diagnosis not present

## 2016-12-07 LAB — CBC
HEMATOCRIT: 39.8 % (ref 36.0–46.0)
Hemoglobin: 13.2 g/dL (ref 12.0–15.0)
MCH: 30.2 pg (ref 26.0–34.0)
MCHC: 33.2 g/dL (ref 30.0–36.0)
MCV: 91.1 fL (ref 78.0–100.0)
PLATELETS: 495 10*3/uL — AB (ref 150–400)
RBC: 4.37 MIL/uL (ref 3.87–5.11)
RDW: 13.3 % (ref 11.5–15.5)
WBC: 7.5 10*3/uL (ref 4.0–10.5)

## 2016-12-07 LAB — BASIC METABOLIC PANEL
ANION GAP: 9 (ref 5–15)
BUN: 8 mg/dL (ref 6–20)
CO2: 26 mmol/L (ref 22–32)
Calcium: 9.6 mg/dL (ref 8.9–10.3)
Chloride: 105 mmol/L (ref 101–111)
Creatinine, Ser: 0.73 mg/dL (ref 0.44–1.00)
GFR calc Af Amer: >60 mL/min (ref >60)
GLUCOSE: 96 mg/dL (ref 65–99)
POTASSIUM: 4.1 mmol/L (ref 3.5–5.1)
Sodium: 140 mmol/L (ref 135–145)

## 2016-12-07 LAB — HCG, SERUM, QUALITATIVE: Preg, Serum: NEGATIVE

## 2016-12-07 LAB — ABO/RH: ABO/RH(D): A POS

## 2016-12-07 MED ORDER — CHLORHEXIDINE GLUCONATE CLOTH 2 % EX PADS: 6.0000 | MEDICATED_PAD | Freq: Once | CUTANEOUS | Status: DC

## 2016-12-07 NOTE — Progress Notes (Signed)
PCP - [place] Cardiologist - denies   EKG - 12/03/16  Pt has hx of seizures, recently seizures have been getting more frequent.    Patient denies shortness of breath, fever, cough and chest pain at PAT appointment   Patient verbalized understanding of instructions that was given to them at the PAT appointment. Patient expressed that there were no further questions.  Patient was also instructed that they will need to review over the PAT instructions again at home before the surgery.

## 2016-12-08 NOTE — Progress Notes (Signed)
Anesthesia Chart Review: Patient is a 31 year old female scheduled for bicoronal craniotomy for resection of tumor, application of cranial navigation on 12/10/16 by Dr. Kathyrn Sheriff.  History includes never smoker, asthma, lipoma excision '13, meningioma with seizures (diagnosed 2017), cholecystectomy 11/23/16. Last ED visit for seizure 12/03/16, and increasing more frequently over the past month. Neurology increased Lamictal--patient already scheduled for meningioma resection. BMI is consistent with morbid obesity.   PCP is Dr. Kelton Pillar.  Meds include albuterol, Neurontin, Norco, Lamictal, Keppra, Mirena, Linzess, Zofran ODT.   BP 112/71   Pulse 86   Temp 36.9 C   Resp 18   Ht 5\' 1"  (1.549 m)   Wt 223 lb 14.4 oz (101.6 kg)   LMP 11/15/2016 (Exact Date)   SpO2 98%   BMI 42.31 kg/m   EKG 12/03/16: SR, low voltage, precordial leads.  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 Partial encasement LEFT optic nerve cisternal segment. Otherwise normal MRI of the brain with and without contrast.  Preoperative labs noted. Serum pregnancy test negative.BMET WNL. H/H 13.2/39.8. PLT 495. T&S done.   If no acute changes then I anticipate that she can proceed as planned.  George Hugh [facility] Short Stay Center/Anesthesiology Phone [phone] 12/08/2016 11:50 AM

## 2016-12-09 ENCOUNTER — Encounter (HOSPITAL_COMMUNITY): Payer: Self-pay | Admitting: Certified Registered Nurse Anesthetist

## 2016-12-09 MED ORDER — CEFAZOLIN SODIUM-DEXTROSE 2-4 GM/100ML-% IV SOLN
2.0000 g | INTRAVENOUS | Status: AC
  Administered 2016-12-10 (×2): 2 g via INTRAVENOUS
  Filled 2016-12-09: qty 100

## 2016-12-10 ENCOUNTER — Inpatient Hospital Stay (HOSPITAL_COMMUNITY): Payer: 59

## 2016-12-10 ENCOUNTER — Inpatient Hospital Stay (HOSPITAL_COMMUNITY)
Admission: RE | Disposition: A | Discharge status: Home / Self Care | Payer: Self-pay | Source: Ambulatory Visit | Attending: Neurosurgery

## 2016-12-10 ENCOUNTER — Inpatient Hospital Stay (HOSPITAL_COMMUNITY)
Admission: RE | Admit: 2016-12-10 | Discharge: 2016-12-13 | DRG: 026 | Disposition: A | Discharge status: Home / Self Care | Payer: 59 | Source: Ambulatory Visit | Attending: Neurosurgery | Admitting: Neurosurgery

## 2016-12-10 ENCOUNTER — Inpatient Hospital Stay (HOSPITAL_COMMUNITY): Payer: 59 | Admitting: Vascular Surgery

## 2016-12-10 ENCOUNTER — Encounter (HOSPITAL_COMMUNITY): Payer: Self-pay | Admitting: *Deleted

## 2016-12-10 ENCOUNTER — Inpatient Hospital Stay (HOSPITAL_COMMUNITY): Payer: 59 | Admitting: Certified Registered Nurse Anesthetist

## 2016-12-10 ENCOUNTER — Other Ambulatory Visit (HOSPITAL_COMMUNITY): Payer: Self-pay | Admitting: *Deleted

## 2016-12-10 DIAGNOSIS — R569 Unspecified convulsions: Secondary | ICD-10-CM | POA: Diagnosis present

## 2016-12-10 DIAGNOSIS — Z452 Encounter for adjustment and management of vascular access device: Secondary | ICD-10-CM

## 2016-12-10 DIAGNOSIS — Z881 Allergy status to other antibiotic agents status: Secondary | ICD-10-CM | POA: Diagnosis not present

## 2016-12-10 DIAGNOSIS — D320 Benign neoplasm of cerebral meninges: Secondary | ICD-10-CM | POA: Diagnosis not present

## 2016-12-10 DIAGNOSIS — D496 Neoplasm of unspecified behavior of brain: Secondary | ICD-10-CM | POA: Diagnosis present

## 2016-12-10 DIAGNOSIS — Z975 Presence of (intrauterine) contraceptive device: Secondary | ICD-10-CM

## 2016-12-10 DIAGNOSIS — J45909 Unspecified asthma, uncomplicated: Secondary | ICD-10-CM | POA: Diagnosis present

## 2016-12-10 DIAGNOSIS — Z9049 Acquired absence of other specified parts of digestive tract: Secondary | ICD-10-CM

## 2016-12-10 DIAGNOSIS — Z91013 Allergy to seafood: Secondary | ICD-10-CM

## 2016-12-10 DIAGNOSIS — D329 Benign neoplasm of meninges, unspecified: Secondary | ICD-10-CM | POA: Diagnosis not present

## 2016-12-10 DIAGNOSIS — Z888 Allergy status to other drugs, medicaments and biological substances status: Secondary | ICD-10-CM | POA: Diagnosis not present

## 2016-12-10 DIAGNOSIS — Z86011 Personal history of benign neoplasm of the brain: Secondary | ICD-10-CM

## 2016-12-10 DIAGNOSIS — Z886 Allergy status to analgesic agent status: Secondary | ICD-10-CM | POA: Diagnosis not present

## 2016-12-10 DIAGNOSIS — F17200 Nicotine dependence, unspecified, uncomplicated: Secondary | ICD-10-CM | POA: Diagnosis present

## 2016-12-10 DIAGNOSIS — J9811 Atelectasis: Secondary | ICD-10-CM | POA: Diagnosis not present

## 2016-12-10 DIAGNOSIS — Z91010 Allergy to peanuts: Secondary | ICD-10-CM | POA: Diagnosis not present

## 2016-12-10 DIAGNOSIS — Z6841 Body Mass Index (BMI) 40.0 and over, adult: Secondary | ICD-10-CM | POA: Diagnosis not present

## 2016-12-10 DIAGNOSIS — E669 Obesity, unspecified: Secondary | ICD-10-CM

## 2016-12-10 DIAGNOSIS — R51 Headache: Secondary | ICD-10-CM | POA: Diagnosis not present

## 2016-12-10 HISTORY — PX: CRANIOTOMY: SHX93

## 2016-12-10 HISTORY — PX: APPLICATION OF CRANIAL NAVIGATION: SHX6578

## 2016-12-10 HISTORY — PX: INTRAPERITONEAL PORT PLACEMENT: SHX6583

## 2016-12-10 LAB — PREPARE RBC (CROSSMATCH)

## 2016-12-10 SURGERY — CRANIOTOMY TUMOR EXCISION
Anesthesia: General

## 2016-12-10 MED ORDER — ACETAMINOPHEN 650 MG RE SUPP: 650.0000 mg | RECTAL | Status: DC | PRN

## 2016-12-10 MED ORDER — ROCURONIUM BROMIDE 10 MG/ML (PF) SYRINGE
PREFILLED_SYRINGE | INTRAVENOUS | Status: DC | PRN
  Administered 2016-12-10: 50 mg via INTRAVENOUS
  Administered 2016-12-10 (×2): 20 mg via INTRAVENOUS
  Administered 2016-12-10: 5 mg via INTRAVENOUS
  Administered 2016-12-10 (×2): 20 mg via INTRAVENOUS

## 2016-12-10 MED ORDER — ESMOLOL HCL 100 MG/10ML IV SOLN
INTRAVENOUS | Status: DC | PRN
  Administered 2016-12-10 (×2): 10 mg via INTRAVENOUS

## 2016-12-10 MED ORDER — SODIUM CHLORIDE 0.9 % IV SOLN
0.0500 ug/kg/min | INTRAVENOUS | Status: AC
  Administered 2016-12-10: 0.05 ug/kg/min via INTRAVENOUS
  Filled 2016-12-10: qty 5000

## 2016-12-10 MED ORDER — ONDANSETRON HCL 4 MG PO TABS: 4.0000 mg | ORAL_TABLET | ORAL | Status: DC | PRN

## 2016-12-10 MED ORDER — SODIUM CHLORIDE 0.9 % IV SOLN
1000.0000 mg | Freq: Two times a day (BID) | INTRAVENOUS | Status: DC
  Administered 2016-12-10 – 2016-12-12 (×4): 1000 mg via INTRAVENOUS
  Filled 2016-12-10 (×4): qty 10

## 2016-12-10 MED ORDER — ARTIFICIAL TEARS OP OINT
TOPICAL_OINTMENT | OPHTHALMIC | Status: DC | PRN
  Administered 2016-12-10: 1 via OPHTHALMIC

## 2016-12-10 MED ORDER — ONDANSETRON HCL 4 MG/2ML IJ SOLN
INTRAMUSCULAR | Status: AC
  Filled 2016-12-10: qty 2

## 2016-12-10 MED ORDER — MANNITOL 25 % IV SOLN
INTRAVENOUS | Status: DC | PRN
  Administered 2016-12-10: 50 g via INTRAVENOUS

## 2016-12-10 MED ORDER — BUPIVACAINE HCL (PF) 0.5 % IJ SOLN
INTRAMUSCULAR | Status: AC
  Filled 2016-12-10: qty 30

## 2016-12-10 MED ORDER — THROMBIN 5000 UNITS EX SOLR
CUTANEOUS | Status: AC
  Filled 2016-12-10: qty 5000

## 2016-12-10 MED ORDER — ESMOLOL HCL 100 MG/10ML IV SOLN
INTRAVENOUS | Status: AC
  Filled 2016-12-10: qty 10

## 2016-12-10 MED ORDER — HYDROMORPHONE HCL 1 MG/ML IJ SOLN
0.2500 mg | INTRAMUSCULAR | Status: DC | PRN
  Administered 2016-12-10: 0.5 mg via INTRAVENOUS

## 2016-12-10 MED ORDER — DEXAMETHASONE SODIUM PHOSPHATE 10 MG/ML IJ SOLN
INTRAMUSCULAR | Status: DC | PRN
  Administered 2016-12-10: 10 mg via INTRAVENOUS

## 2016-12-10 MED ORDER — PROMETHAZINE HCL 25 MG PO TABS: 12.5000 mg | ORAL_TABLET | ORAL | Status: DC | PRN

## 2016-12-10 MED ORDER — LIDOCAINE 2% (20 MG/ML) 5 ML SYRINGE
INTRAMUSCULAR | Status: AC
  Filled 2016-12-10: qty 5

## 2016-12-10 MED ORDER — BACITRACIN ZINC 500 UNIT/GM EX OINT
TOPICAL_OINTMENT | CUTANEOUS | Status: AC
  Filled 2016-12-10: qty 28.35

## 2016-12-10 MED ORDER — ACETAMINOPHEN 325 MG PO TABS
650.0000 mg | ORAL_TABLET | ORAL | Status: DC | PRN
  Administered 2016-12-11 – 2016-12-12 (×2): 650 mg via ORAL
  Filled 2016-12-10 (×2): qty 2

## 2016-12-10 MED ORDER — PROPOFOL 10 MG/ML IV BOLUS
INTRAVENOUS | Status: AC
  Filled 2016-12-10: qty 20

## 2016-12-10 MED ORDER — SUGAMMADEX SODIUM 200 MG/2ML IV SOLN
INTRAVENOUS | Status: DC | PRN
  Administered 2016-12-10: 300 mg via INTRAVENOUS

## 2016-12-10 MED ORDER — EPINEPHRINE 0.3 MG/0.3ML IJ SOAJ: 0.3000 mg | Freq: Every day | INTRAMUSCULAR | Status: DC | PRN

## 2016-12-10 MED ORDER — DEXAMETHASONE SODIUM PHOSPHATE 10 MG/ML IJ SOLN
9.0000 mg | Freq: Four times a day (QID) | INTRAMUSCULAR | Status: AC
  Administered 2016-12-10 – 2016-12-11 (×4): 9 mg via INTRAVENOUS
  Filled 2016-12-10 (×4): qty 1

## 2016-12-10 MED ORDER — SUGAMMADEX SODIUM 500 MG/5ML IV SOLN
INTRAVENOUS | Status: AC
  Filled 2016-12-10: qty 5

## 2016-12-10 MED ORDER — MORPHINE SULFATE (PF) 2 MG/ML IV SOLN
1.0000 mg | INTRAVENOUS | Status: DC | PRN
  Administered 2016-12-10 (×3): 2 mg via INTRAVENOUS
  Administered 2016-12-11: 1 mg via INTRAVENOUS
  Administered 2016-12-11: 2 mg via INTRAVENOUS
  Administered 2016-12-11 (×2): 1 mg via INTRAVENOUS
  Administered 2016-12-11 – 2016-12-12 (×4): 2 mg via INTRAVENOUS
  Filled 2016-12-10 (×11): qty 1

## 2016-12-10 MED ORDER — LIDOCAINE 2% (20 MG/ML) 5 ML SYRINGE
INTRAMUSCULAR | Status: DC | PRN
  Administered 2016-12-10: 60 mg via INTRAVENOUS

## 2016-12-10 MED ORDER — FENTANYL CITRATE (PF) 250 MCG/5ML IJ SOLN
INTRAMUSCULAR | Status: AC
  Filled 2016-12-10: qty 5

## 2016-12-10 MED ORDER — MIDAZOLAM HCL 2 MG/2ML IJ SOLN
INTRAMUSCULAR | Status: AC
  Filled 2016-12-10: qty 6

## 2016-12-10 MED ORDER — LIDOCAINE-EPINEPHRINE 1 %-1:100000 IJ SOLN
INTRAMUSCULAR | Status: DC | PRN
  Administered 2016-12-10: 20 mL

## 2016-12-10 MED ORDER — DEXAMETHASONE SODIUM PHOSPHATE 4 MG/ML IJ SOLN
4.0000 mg | Freq: Four times a day (QID) | INTRAMUSCULAR | Status: DC
  Administered 2016-12-11 – 2016-12-12 (×3): 4 mg via INTRAVENOUS
  Filled 2016-12-10 (×3): qty 1

## 2016-12-10 MED ORDER — HYDROMORPHONE HCL 1 MG/ML IJ SOLN
INTRAMUSCULAR | Status: AC
  Filled 2016-12-10: qty 1

## 2016-12-10 MED ORDER — PHENYLEPHRINE HCL 10 MG/ML IJ SOLN
INTRAMUSCULAR | Status: DC | PRN
  Administered 2016-12-10: 15 ug/min via INTRAVENOUS

## 2016-12-10 MED ORDER — SODIUM CHLORIDE 0.9 % IV SOLN
INTRAVENOUS | Status: DC
  Administered 2016-12-10 – 2016-12-11 (×3): via INTRAVENOUS

## 2016-12-10 MED ORDER — DOCUSATE SODIUM 100 MG PO CAPS
100.0000 mg | ORAL_CAPSULE | Freq: Two times a day (BID) | ORAL | Status: DC
  Administered 2016-12-11 – 2016-12-13 (×5): 100 mg via ORAL
  Filled 2016-12-10 (×5): qty 1

## 2016-12-10 MED ORDER — THROMBIN 5000 UNITS EX SOLR
OROMUCOSAL | Status: DC | PRN
  Administered 2016-12-10 (×2): via TOPICAL

## 2016-12-10 MED ORDER — ONDANSETRON HCL 4 MG/2ML IJ SOLN
INTRAMUSCULAR | Status: DC | PRN
  Administered 2016-12-10: 4 mg via INTRAVENOUS

## 2016-12-10 MED ORDER — BACITRACIN ZINC 500 UNIT/GM EX OINT
TOPICAL_OINTMENT | CUTANEOUS | Status: DC | PRN
  Administered 2016-12-10: 1 via TOPICAL

## 2016-12-10 MED ORDER — SODIUM CHLORIDE 0.9 % IR SOLN
Status: DC | PRN
  Administered 2016-12-10: 10:00:00

## 2016-12-10 MED ORDER — 0.9 % SODIUM CHLORIDE (POUR BTL) OPTIME
TOPICAL | Status: DC | PRN
  Administered 2016-12-10 (×3): 1000 mL

## 2016-12-10 MED ORDER — GABAPENTIN 100 MG PO CAPS
100.0000 mg | ORAL_CAPSULE | Freq: Three times a day (TID) | ORAL | Status: DC
  Administered 2016-12-11 – 2016-12-13 (×7): 100 mg via ORAL
  Filled 2016-12-10 (×8): qty 1

## 2016-12-10 MED ORDER — SODIUM CHLORIDE 0.9 % IV SOLN
Freq: Once | INTRAVENOUS | Status: AC
  Administered 2016-12-10 (×2): via INTRAVENOUS

## 2016-12-10 MED ORDER — THROMBIN 20000 UNITS EX SOLR
CUTANEOUS | Status: AC
  Filled 2016-12-10: qty 20000

## 2016-12-10 MED ORDER — LABETALOL HCL 5 MG/ML IV SOLN: 10.0000 mg | INTRAVENOUS | Status: DC | PRN

## 2016-12-10 MED ORDER — PROMETHAZINE HCL 25 MG/ML IJ SOLN: 6.2500 mg | INTRAMUSCULAR | Status: DC | PRN

## 2016-12-10 MED ORDER — ROCURONIUM BROMIDE 50 MG/5ML IV SOSY
PREFILLED_SYRINGE | INTRAVENOUS | Status: AC
  Filled 2016-12-10: qty 10

## 2016-12-10 MED ORDER — SURGIFOAM 100 EX MISC
CUTANEOUS | Status: DC | PRN
  Administered 2016-12-10: 10:00:00 via TOPICAL

## 2016-12-10 MED ORDER — HEMOSTATIC AGENTS (NO CHARGE) OPTIME
TOPICAL | Status: DC | PRN
  Administered 2016-12-10: 1 via TOPICAL

## 2016-12-10 MED ORDER — BISACODYL 10 MG RE SUPP: 10.0000 mg | Freq: Every day | RECTAL | Status: DC | PRN

## 2016-12-10 MED ORDER — SENNA 8.6 MG PO TABS
1.0000 | ORAL_TABLET | Freq: Two times a day (BID) | ORAL | Status: DC
  Administered 2016-12-11 – 2016-12-13 (×5): 8.6 mg via ORAL
  Filled 2016-12-10 (×5): qty 1

## 2016-12-10 MED ORDER — LACTATED RINGERS IV SOLN
INTRAVENOUS | Status: DC
  Administered 2016-12-10: 08:00:00 via INTRAVENOUS

## 2016-12-10 MED ORDER — BUPIVACAINE HCL (PF) 0.5 % IJ SOLN
INTRAMUSCULAR | Status: DC | PRN
  Administered 2016-12-10: 20 mL

## 2016-12-10 MED ORDER — PANTOPRAZOLE SODIUM 40 MG IV SOLR
40.0000 mg | Freq: Every day | INTRAVENOUS | Status: DC
  Administered 2016-12-10: 40 mg via INTRAVENOUS
  Filled 2016-12-10: qty 40

## 2016-12-10 MED ORDER — LAMOTRIGINE 100 MG PO TABS
100.0000 mg | ORAL_TABLET | Freq: Two times a day (BID) | ORAL | Status: DC
  Administered 2016-12-11 – 2016-12-13 (×5): 100 mg via ORAL
  Filled 2016-12-10 (×6): qty 1

## 2016-12-10 MED ORDER — ALBUTEROL SULFATE (2.5 MG/3ML) 0.083% IN NEBU
3.0000 mL | INHALATION_SOLUTION | Freq: Four times a day (QID) | RESPIRATORY_TRACT | Status: DC | PRN
  Administered 2016-12-10: 3 mL via RESPIRATORY_TRACT
  Filled 2016-12-10: qty 3

## 2016-12-10 MED ORDER — ONDANSETRON HCL 4 MG/2ML IJ SOLN
4.0000 mg | INTRAMUSCULAR | Status: DC | PRN
  Administered 2016-12-10 (×2): 4 mg via INTRAVENOUS
  Filled 2016-12-10: qty 2

## 2016-12-10 MED ORDER — SODIUM CHLORIDE 0.9 % IV SOLN
1000.0000 mg | INTRAVENOUS | Status: AC
  Administered 2016-12-10: 1000 mg via INTRAVENOUS
  Filled 2016-12-10: qty 10

## 2016-12-10 MED ORDER — LEVETIRACETAM 500 MG PO TABS
1000.0000 mg | ORAL_TABLET | Freq: Two times a day (BID) | ORAL | Status: DC
  Filled 2016-12-10: qty 2

## 2016-12-10 MED ORDER — REMIFENTANIL HCL 1 MG IV SOLR
INTRAVENOUS | Status: DC | PRN
  Administered 2016-12-10 (×2): 25 ug via INTRAVENOUS

## 2016-12-10 MED ORDER — LIDOCAINE-EPINEPHRINE 1 %-1:100000 IJ SOLN
INTRAMUSCULAR | Status: AC
  Filled 2016-12-10: qty 1

## 2016-12-10 MED ORDER — PROPOFOL 10 MG/ML IV BOLUS
INTRAVENOUS | Status: DC | PRN
  Administered 2016-12-10: 50 mg via INTRAVENOUS
  Administered 2016-12-10: 150 mg via INTRAVENOUS
  Administered 2016-12-10: 50 mg via INTRAVENOUS

## 2016-12-10 MED ORDER — CEFAZOLIN SODIUM 1 G IJ SOLR
INTRAMUSCULAR | Status: AC
  Filled 2016-12-10: qty 20

## 2016-12-10 MED ORDER — MIDAZOLAM HCL 5 MG/5ML IJ SOLN
INTRAMUSCULAR | Status: DC | PRN
Start: 2016-12-10 — End: 2016-12-10
  Administered 2016-12-10: 1 mg via INTRAVENOUS
  Administered 2016-12-10: 3 mg via INTRAVENOUS

## 2016-12-10 MED ORDER — HYDROCODONE-ACETAMINOPHEN 5-325 MG PO TABS
1.0000 | ORAL_TABLET | ORAL | Status: DC | PRN
  Administered 2016-12-10 – 2016-12-13 (×11): 1 via ORAL
  Filled 2016-12-10 (×11): qty 1

## 2016-12-10 MED ORDER — ARTIFICIAL TEARS OP OINT
TOPICAL_OINTMENT | OPHTHALMIC | Status: AC
  Filled 2016-12-10: qty 3.5

## 2016-12-10 MED ORDER — DEXAMETHASONE SODIUM PHOSPHATE 4 MG/ML IJ SOLN: 4.0000 mg | Freq: Three times a day (TID) | INTRAMUSCULAR | Status: DC

## 2016-12-10 MED ORDER — FENTANYL CITRATE (PF) 100 MCG/2ML IJ SOLN
INTRAMUSCULAR | Status: DC | PRN
  Administered 2016-12-10 (×3): 50 ug via INTRAVENOUS
  Administered 2016-12-10: 100 ug via INTRAVENOUS

## 2016-12-10 MED ORDER — CEFAZOLIN SODIUM-DEXTROSE 2-4 GM/100ML-% IV SOLN
2.0000 g | Freq: Three times a day (TID) | INTRAVENOUS | Status: AC
  Administered 2016-12-10 – 2016-12-11 (×2): 2 g via INTRAVENOUS
  Filled 2016-12-10 (×2): qty 100

## 2016-12-10 MED ORDER — LABETALOL HCL 5 MG/ML IV SOLN
INTRAVENOUS | Status: DC | PRN
  Administered 2016-12-10 (×2): 5 mg via INTRAVENOUS
  Administered 2016-12-10: 10 mg via INTRAVENOUS

## 2016-12-10 MED ORDER — LINACLOTIDE 290 MCG PO CAPS
290.0000 ug | ORAL_CAPSULE | Freq: Every day | ORAL | Status: DC | PRN
  Filled 2016-12-10: qty 1

## 2016-12-10 SURGICAL SUPPLY — 111 items
BANDAGE GAUZE 4  KLING STR (GAUZE/BANDAGES/DRESSINGS) ×2 IMPLANT
BATTERY IQ STERILE (MISCELLANEOUS) ×2 IMPLANT
BENZOIN TINCTURE PRP APPL 2/3 (GAUZE/BANDAGES/DRESSINGS) IMPLANT
BLADE CLIPPER SURG (BLADE) ×2 IMPLANT
BLADE SAW GIGLI 16 STRL (MISCELLANEOUS) IMPLANT
BLADE SURG 15 STRL LF DISP TIS (BLADE) IMPLANT
BLADE SURG 15 STRL SS (BLADE)
BLADE ULTRA TIP 2M (BLADE) ×2 IMPLANT
BNDG GAUZE ELAST 4 BULKY (GAUZE/BANDAGES/DRESSINGS) ×2 IMPLANT
BUR ACORN 6.0 PRECISION (BURR) ×2 IMPLANT
BUR ROUND FLUTED 4 SOFT TCH (BURR) IMPLANT
BUR SPIRAL ROUTER 2.3 (BUR) ×2 IMPLANT
CANISTER SUCT 3000ML PPV (MISCELLANEOUS) ×4 IMPLANT
CARTRIDGE OIL MAESTRO DRILL (MISCELLANEOUS) ×1 IMPLANT
CATH FOLEY 2WAY SLVR  5CC 16FR (CATHETERS) ×1
CATH FOLEY 2WAY SLVR 5CC 16FR (CATHETERS) ×1 IMPLANT
CATH VENTRIC 35X38 W/TROCAR LG (CATHETERS) IMPLANT
CLIP RANEY DISP (INSTRUMENTS) ×2 IMPLANT
CLIP TI MEDIUM 6 (CLIP) IMPLANT
CONT SPEC 4OZ CLIKSEAL STRL BL (MISCELLANEOUS) ×2 IMPLANT
COVER MAYO STAND STRL (DRAPES) IMPLANT
DECANTER SPIKE VIAL GLASS SM (MISCELLANEOUS) ×2 IMPLANT
DIFFUSER DRILL AIR PNEUMATIC (MISCELLANEOUS) ×2 IMPLANT
DRAIN SUBARACHNOID (WOUND CARE) IMPLANT
DRAPE HALF SHEET 40X57 (DRAPES) ×2 IMPLANT
DRAPE INCISE 23X17 IOBAN STRL (DRAPES) ×1
DRAPE INCISE IOBAN 23X17 STRL (DRAPES) ×1 IMPLANT
DRAPE MICROSCOPE LEICA (MISCELLANEOUS) ×2 IMPLANT
DRAPE NEUROLOGICAL W/INCISE (DRAPES) ×2 IMPLANT
DRAPE STERI IOBAN 125X83 (DRAPES) IMPLANT
DRAPE SURG 17X23 STRL (DRAPES) IMPLANT
DRAPE WARM FLUID 44X44 (DRAPE) ×2 IMPLANT
DRSG ADAPTIC 3X8 NADH LF (GAUZE/BANDAGES/DRESSINGS) IMPLANT
DRSG TELFA 3X8 NADH (GAUZE/BANDAGES/DRESSINGS) ×2 IMPLANT
DURAMATRIX ONLAY 3X3 (Plate) ×2 IMPLANT
DURAPREP 6ML APPLICATOR 50/CS (WOUND CARE) IMPLANT
ELECT REM PT RETURN 9FT ADLT (ELECTROSURGICAL) ×2
ELECTRODE REM PT RTRN 9FT ADLT (ELECTROSURGICAL) ×1 IMPLANT
EVACUATOR 1/8 PVC DRAIN (DRAIN) IMPLANT
EVACUATOR SILICONE 100CC (DRAIN) IMPLANT
FORCEPS BIPOLAR SPETZLER 8 1.0 (NEUROSURGERY SUPPLIES) ×2 IMPLANT
GAUZE SPONGE 4X4 12PLY STRL (GAUZE/BANDAGES/DRESSINGS) ×2 IMPLANT
GAUZE SPONGE 4X4 16PLY XRAY LF (GAUZE/BANDAGES/DRESSINGS) ×4 IMPLANT
GLOVE BIO SURGEON STRL SZ7 (GLOVE) IMPLANT
GLOVE BIOGEL PI IND STRL 7.0 (GLOVE) IMPLANT
GLOVE BIOGEL PI IND STRL 7.5 (GLOVE) ×1 IMPLANT
GLOVE BIOGEL PI INDICATOR 7.0 (GLOVE)
GLOVE BIOGEL PI INDICATOR 7.5 (GLOVE) ×1
GLOVE ECLIPSE 6.5 STRL STRAW (GLOVE) ×2 IMPLANT
GLOVE ECLIPSE 7.0 STRL STRAW (GLOVE) IMPLANT
GLOVE ECLIPSE 7.5 STRL STRAW (GLOVE) ×2 IMPLANT
GLOVE ECLIPSE 8.0 STRL XLNG CF (GLOVE) ×10 IMPLANT
GLOVE EXAM NITRILE LRG STRL (GLOVE) IMPLANT
GLOVE EXAM NITRILE XL STR (GLOVE) IMPLANT
GLOVE EXAM NITRILE XS STR PU (GLOVE) IMPLANT
GOWN STRL REUS W/ TWL LRG LVL3 (GOWN DISPOSABLE) ×2 IMPLANT
GOWN STRL REUS W/ TWL XL LVL3 (GOWN DISPOSABLE) ×1 IMPLANT
GOWN STRL REUS W/TWL 2XL LVL3 (GOWN DISPOSABLE) ×4 IMPLANT
GOWN STRL REUS W/TWL LRG LVL3 (GOWN DISPOSABLE) ×2
GOWN STRL REUS W/TWL XL LVL3 (GOWN DISPOSABLE) ×1
HEMOSTAT POWDER KIT SURGIFOAM (HEMOSTASIS) ×4 IMPLANT
HEMOSTAT SURGICEL 2X14 (HEMOSTASIS) ×2 IMPLANT
HOOK DURA (MISCELLANEOUS) ×2 IMPLANT
IV NS 1000ML (IV SOLUTION)
IV NS 1000ML BAXH (IV SOLUTION) IMPLANT
KIT BASIN OR (CUSTOM PROCEDURE TRAY) ×2 IMPLANT
KIT DRAIN CSF ACCUDRAIN (MISCELLANEOUS) IMPLANT
KIT ROOM TURNOVER OR (KITS) ×2 IMPLANT
KNIFE ARACHNOID DISP AM-24-S (MISCELLANEOUS) ×2 IMPLANT
MARKER SPHERE PSV REFLC 13MM (MARKER) ×10 IMPLANT
NEEDLE HYPO 22GX1.5 SAFETY (NEEDLE) ×2 IMPLANT
NEEDLE HYPO 25X1 1.5 SAFETY (NEEDLE) ×2 IMPLANT
NEEDLE SPNL 18GX3.5 QUINCKE PK (NEEDLE) IMPLANT
NS IRRIG 1000ML POUR BTL (IV SOLUTION) ×6 IMPLANT
OIL CARTRIDGE MAESTRO DRILL (MISCELLANEOUS) ×2
PACK CRANIOTOMY (CUSTOM PROCEDURE TRAY) ×2 IMPLANT
PATTIES SURGICAL .25X.25 (GAUZE/BANDAGES/DRESSINGS) IMPLANT
PATTIES SURGICAL .5 X.5 (GAUZE/BANDAGES/DRESSINGS) IMPLANT
PATTIES SURGICAL .5 X3 (DISPOSABLE) ×2 IMPLANT
PATTIES SURGICAL 1/4 X 3 (GAUZE/BANDAGES/DRESSINGS) IMPLANT
PATTIES SURGICAL 1X1 (DISPOSABLE) ×2 IMPLANT
PERFORATOR LRG  14-11MM (BIT) ×1
PERFORATOR LRG 14-11MM (BIT) ×1 IMPLANT
PIN MAYFIELD SKULL DISP (PIN) ×2 IMPLANT
PLATE 1.5  2HOLE LNG NEURO (Plate) ×2 IMPLANT
PLATE 1.5 2HOLE LNG NEURO (Plate) ×2 IMPLANT
PLATE 1.5/0.2 85X50M HEX PNL (Plate) ×2 IMPLANT
PLATE 1.5/0.5 18.5MM BURR HOLE (Plate) ×2 IMPLANT
RUBBERBAND STERILE (MISCELLANEOUS) ×8 IMPLANT
SCREW SELF DRILL HT 1.5/4MM (Screw) ×28 IMPLANT
SET TUBING W/EXT DISP (INSTRUMENTS) ×2 IMPLANT
SPECIMEN JAR SMALL (MISCELLANEOUS) IMPLANT
SPONGE NEURO XRAY DETECT 1X3 (DISPOSABLE) IMPLANT
SPONGE SURGIFOAM ABS GEL 100 (HEMOSTASIS) ×2 IMPLANT
STAPLER VISISTAT 35W (STAPLE) ×4 IMPLANT
STOCKINETTE 6  STRL (DRAPES) ×1
STOCKINETTE 6 STRL (DRAPES) ×1 IMPLANT
SUT ETHILON 3 0 FSL (SUTURE) IMPLANT
SUT ETHILON 3 0 PS 1 (SUTURE) IMPLANT
SUT NURALON 4 0 TR CR/8 (SUTURE) ×2 IMPLANT
SUT SILK 0 TIES 10X30 (SUTURE) IMPLANT
SUT VIC AB 0 CT1 18XCR BRD8 (SUTURE) ×2 IMPLANT
SUT VIC AB 0 CT1 8-18 (SUTURE) ×2
SUT VIC AB 3-0 SH 8-18 (SUTURE) ×8 IMPLANT
TIP STRAIGHT 25KHZ (INSTRUMENTS) ×2 IMPLANT
TOWEL GREEN STERILE (TOWEL DISPOSABLE) ×2 IMPLANT
TOWEL GREEN STERILE FF (TOWEL DISPOSABLE) ×2 IMPLANT
TRAY FOLEY W/METER SILVER 16FR (SET/KITS/TRAYS/PACK) ×2 IMPLANT
TUBE CONNECTING 12X1/4 (SUCTIONS) ×2 IMPLANT
UNDERPAD 30X30 (UNDERPADS AND DIAPERS) IMPLANT
WATER STERILE IRR 1000ML POUR (IV SOLUTION) ×2 IMPLANT

## 2016-12-10 NOTE — Anesthesia Procedure Notes (Signed)
Arterial Line Insertion Performed by: Myrtie Soman, CRNA  Patient location: Pre-op. Preanesthetic checklist: patient identified, IV checked, site marked, risks and benefits discussed, surgical consent, monitors and equipment checked, pre-op evaluation, timeout performed and anesthesia consent Lidocaine 1% used for infiltration and patient sedated radial was placed Catheter size: 20 G Hand hygiene performed  and maximum sterile barriers used   Attempts: 5 or more (2 attempts left radial by Bea Laura, unable to advance catheter. Tim CRNA attempted placement x 2 left radial, unable to advance catheter. Brittney CNRA attempted x1 right wrist and able to get catheter to advance. ) Procedure performed without using ultrasound guided technique. Ultrasound Notes:anatomy identified

## 2016-12-10 NOTE — Anesthesia Procedure Notes (Signed)
Central Venous Catheter Insertion Performed by: Myrtie Soman, anesthesiologist Start/End4/13/2018 9:30 AM, 12/10/2016 9:45 AM Patient location: Pre-op. Preanesthetic checklist: patient identified, IV checked, site marked, risks and benefits discussed, surgical consent, monitors and equipment checked, pre-op evaluation, timeout performed and anesthesia consent Position: Trendelenburg Lidocaine 1% used for infiltration and patient sedated Hand hygiene performed , maximum sterile barriers used  and Seldinger technique used Catheter size: 8 Fr Total catheter length 16. Central line was placed.Double lumen Procedure performed using ultrasound guided technique. Ultrasound Notes:anatomy identified, needle tip was noted to be adjacent to the nerve/plexus identified, no ultrasound evidence of intravascular and/or intraneural injection and image(s) printed for medical record Attempts: 1 Following insertion, dressing applied, line sutured and Biopatch. Post procedure assessment: blood return through all ports  Patient tolerated the procedure well with no immediate complications.

## 2016-12-10 NOTE — H&P (Signed)
CC:  Brain tumor  HPI: Miss Kendra [name] is a 31 year old woman I am seeing for the above, at the request of Dr. Jannifer Franklin. 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 More recently, in November of last year, she had a witnessed seizure where she was found to be jerking all 4 extremities. She was very confused and disoriented after that, and was taken to the hospital. She was started on Keppra. Repeat MRI was obtained which demonstrated interval growth of the olfactory groove meningioma. She does report subtle increase in the severity of her frontal headaches. She is not reporting any changes in vision, smell, or taste. She does not have any new numbness tingling or weakness.   PMH: Past Medical History:  Diagnosis Date  . Asthma   . Convulsions/seizures ([place]) 07/27/2016  . H/O: C-section   . Lipoma   . Lipoma of LUQ abdomen, s/p removal 06/27/2012 05/24/2012  . Meningioma ([place]) 07/27/2016   Anterior fossa    PSH: Past Surgical History:  Procedure Laterality Date  . BREAST SURGERY  12/30/11   breast reduction  . CESAREAN SECTION  09/09/2010   [place]  . CESAREAN SECTION N/A 01/13/2015   Procedure: CESAREAN SECTION;  Surgeon: Janyth Pupa, DO;  Location: [facility];  Service: Obstetrics;  Laterality: N/A;  EDD 01/18/15 would like [place] dressing  . CHOLECYSTECTOMY N/A 11/23/2016   Procedure: LAPAROSCOPIC CHOLECYSTECTOMY WITH INTRAOPERATIVE CHOLANGIOGRAM;  Surgeon: Donnie Mesa, MD;  Location: [facility];  Service: General;  Laterality: N/A;  . LYMPH NODE DISSECTION  2013   Abdominal    SH: Social History  Substance Use Topics  . Smoking status: Never Smoker  . Smokeless tobacco: Never Used  . Alcohol use No    MEDS: Prior to Admission medications   Medication Sig Start Date End Date Taking? Authorizing Provider  acetaminophen (TYLENOL) 500 MG tablet Take 1,000 mg by mouth every 6 (six) hours as needed (for pain/headaches.).   Yes Historical Provider,  MD  albuterol (PROVENTIL HFA;VENTOLIN HFA) 108 (90 BASE) MCG/ACT inhaler Inhale 2 puffs into the lungs every 6 (six) hours as needed for wheezing or shortness of breath.   Yes Historical Provider, MD  diphenhydrAMINE (BENADRYL) 25 mg capsule Take 1 capsule (25 mg total) by mouth every 6 (six) hours as needed. 07/17/16  Yes Courteney Lyn Mackuen, MD  gabapentin (NEURONTIN) 100 MG capsule Take 1 capsule (100 mg total) by mouth 3 (three) times daily. Patient taking differently: Take 100 mg by mouth 2 (two) times daily.  10/21/16  Yes Kathrynn Ducking, MD  lamoTRIgine (LAMICTAL) 100 MG tablet Take 1 tablet (100 mg total) by mouth 2 (two) times daily. 12/03/16  Yes Kathrynn Ducking, MD  levETIRAcetam (KEPPRA) 500 MG tablet Take 1 tablet (500 mg total) by mouth 2 (two) times daily. 07/13/16  Yes Kristen N Ward, DO  levonorgestrel (MIRENA) 20 MCG/24HR IUD 1 each by Intrauterine route once.   Yes Historical Provider, MD  LINZESS 290 MCG CAPS capsule Take 290 mcg by mouth daily as needed (constipation).  07/16/16  Yes Historical Provider, MD  EPINEPHrine 0.3 mg/0.3 mL IJ SOAJ injection Inject 0.3 mg into the muscle daily as needed (for anaphylatic allergic reactions).     Historical Provider, MD  HYDROcodone-acetaminophen (NORCO/VICODIN) 5-325 MG tablet Take 1 tablet by mouth every 4 (four) hours as needed. Patient not taking: Reported on 12/02/2016 11/15/16   Isla Pence, MD  HYDROcodone-acetaminophen (NORCO/VICODIN) 5-325 MG tablet Take 1-2 tablets  by mouth every 6 (six) hours as needed for moderate pain. Patient not taking: Reported on 12/02/2016 11/23/16   Donnie Mesa, MD  ondansetron (ZOFRAN ODT) 4 MG disintegrating tablet Take 1 tablet (4 mg total) by mouth every 8 (eight) hours as needed. Patient not taking: Reported on 12/02/2016 11/15/16   Isla Pence, MD    ALLERGY: Allergies  Allergen Reactions  . Asa Buff (Mag [Buffered Aspirin] Anaphylaxis  . Erythromycin Anaphylaxis  . Iodine Anaphylaxis and  Hives  . Peanut-Containing Drug Products Anaphylaxis and Hives    Pt is allergic to peanut oil  . Shellfish Allergy Anaphylaxis, Hives, Swelling and Other (See Comments)    Pt throat swells    ROS: ROS  NEUROLOGIC EXAM: Awake, alert, oriented Memory and concentration grossly intact Speech fluent, appropriate CN grossly intact Motor exam: Upper Extremities Deltoid Bicep Tricep Grip  Right 5/5 5/5 5/5 5/5  Left 5/5 5/5 5/5 5/5   Lower Extremity IP Quad PF DF EHL  Right 5/5 5/5 5/5 5/5 5/5  Left 5/5 5/5 5/5 5/5 5/5   Sensation grossly intact to LT  [facility]: MRI of the brain was reviewed, and compared to prior. This demonstrates enlargement of a olfactory groove meningioma, which extends back to the level of the planum sphenoidale. There does not appear to be any compression of the optic apparatus. FLAIR sequence does demonstrate edema of the right mesial frontal lobe adjacent to the tumor.  IMPRESSION: - 31 y.o. female with enlargement with associated edema of an olfactory groove / planum sphenoidale meningioma. With her young age, increase in size, and presence of edema, resection is indicated.  PLAN: - Proceed with bifrontal craniotomy for resection of tumor  I have reviewed the risks, benefits, and alternatives to surgery with the patient and her family in the office. All questions were answered and consent was obtained.

## 2016-12-10 NOTE — OR Nursing (Signed)
Patient allergic to iodine not used ,hosipta; approved prep used for foley insertion

## 2016-12-10 NOTE — Anesthesia Preprocedure Evaluation (Signed)
Anesthesia Evaluation  Patient identified by MRN, date of birth, ID band Patient awake    Reviewed: Allergy & Precautions, NPO status , Patient's Chart, lab work & pertinent test results  Airway Mallampati: II  TM Distance: <3 FB Neck ROM: Full    Dental no notable dental hx.    Pulmonary neg pulmonary ROS,    Pulmonary exam normal breath sounds clear to auscultation       Cardiovascular negative cardio ROS Normal cardiovascular exam Rhythm:Regular Rate:Normal     Neuro/Psych negative neurological ROS  negative psych ROS   GI/Hepatic negative GI ROS, Neg liver ROS,   Endo/Other  Morbid obesity  Renal/GU negative Renal ROS  negative genitourinary   Musculoskeletal negative musculoskeletal ROS (+)   Abdominal   Peds negative pediatric ROS (+)  Hematology negative hematology ROS (+)   Anesthesia Other Findings   Reproductive/Obstetrics negative OB ROS                             Anesthesia Physical Anesthesia Plan  ASA: II  Anesthesia Plan: General   Post-op Pain Management:    Induction: Intravenous  Airway Management Planned: Oral ETT  Additional Equipment: Arterial line  Intra-op Plan:   Post-operative Plan: Possible Post-op intubation/ventilation  Informed Consent: I have reviewed the patients History and Physical, chart, labs and discussed the procedure including the risks, benefits and alternatives for the proposed anesthesia with the patient or authorized representative who has indicated his/her understanding and acceptance.   Dental advisory given  Plan Discussed with: CRNA and Surgeon  Anesthesia Plan Comments:         Anesthesia Quick Evaluation

## 2016-12-10 NOTE — Op Note (Signed)
PREOP DIAGNOSIS:  1. Anterior skull base meningioma   POSTOP DIAGNOSIS: Same  PROCEDURE: 1. Transbasal approach to anterior skull base 2. Resection of anterior skull base meningioma 3. Use of intraoperative stereotaxy 4. Use of intraoperative microscope for microdissection  SURGEON: Dr. Consuella Lose, MD  ASSISTANT: Dr. Charlie Pitter, MD  ANESTHESIA: General Endotracheal  EBL: 600cc  SPECIMENS: Skull base tumor for permanent pathology  DRAINS: None  COMPLICATIONS: None immediate  CONDITION: Stable to PACU  HISTORY: Kendra [name] is a 31 y.o. female with a known history of anterior skull base meningioma. This has been followed radiographically. She recently had worsening headache, and continued seizures. Repeat MRI scan demonstrated growth of the tumor, as well as new onset of frontal lobe edema. Given her age, symptoms, and radiographic findings, surgical resection was indicated. The risks and benefits of the surgery were explained in detail to the patient and her family. After all questions were answered informed consent was obtained and witnessed.  PROCEDURE IN DETAIL: The patient was brought to the operating room. After induction of general anesthesia, the patient was positioned on the operative table in the [place] headholder in the supine position. All pressure points were meticulously padded. Utilizing the preoperative stereotactic MRI scan, surface markers were co-registered until satisfactory accuracy was achieved. A standard bicoronal incision was then marked out to allow access to the skull base. Skin incision was then marked out and prepped and draped in the usual sterile fashion.  After timeout was conducted, incision was infiltrated with local anesthetic with epinephrine. Bicoronal skin incision was then opened sharply, with care taken to preserve the pericranium. Skin flap was then elevated in the loose areolar plane and reflected anteriorly. A frontal pericranial  flap was then elevated based anteriorly, and placed in a moist gauze.  Bur holes were then created in bilateral pterion, and bur holes were created in the midline. These were connected with the craniotome, and a bifrontal craniotomy flap was elevated. The dura of the frontal lobes was densely adherent to the bone surface, and was removed with the bone flap. Bleeding on the epidural surface was easily controlled with passive hemostats. High-speed drill was then used to drill down the inner surface of the frontal bone to allow good visualization of the anterior skull base. The falx was then coagulated and cut.  The microscope was then draped sterilely and brought into the field, and the remainder of the case was done under the microscope using microdissection. Retractors were used to elevate the frontal lobe from the anterior skull base, and the tumor was identified easily. Using a combination of bipolar electrocautery and suction, we initially peeled the tumor away from the anterior skull base. Once a portion of this tumor was devascularized, we are able to easily remove a large portion of the tumor from the right frontal lobe. Debulking of the tumor allowed further dissection of the left frontal lobe, which was again removed. The attachment of the tumor to the anterior skull base was coagulated, and was slowly elevated working posteriorly. Posterior margin of the tumor was then identified, and did appear to be extending partially into the anterior aspect of the sella. Microdissectors were used to dissect the tumor away from surrounding arachnoid tissue. Bipolar electrocautery was then used to coagulate the portion of the tumor within the sella. The dura along the anterior skull base was also coagulated completely.  Once the tumor was resected, the wound is irrigated with copious amounts of normal saline irrigation.  Good hemostasis was achieved on the brain surface with a combination of bipolar electrocautery,  morcellized Gelfoam with thrombin. The brain surface was then covered with Surgicel. A small opening in the frontal sinus was then addressed. The mucosal was exonerated, the sinus cavity was filled with Gelfoam and covered with bone wax. The previously harvested pericranial flap was then laid over the anterior skull base in the region of the tumor resection. The brain surface was then covered with collagen dural substitute. Bone flap was then plated with standard titanium plates and screws.   The  galea was closed in standard fashion with interrupted 0 and 3-0 Vicryl stitches. Skin was closed using standard surgical skin staples. Sterile dressing was then applied after the [place] head holder was removed. The patient was then transferred to the stretcher and taken to the PACU in stable hemodynamic condition.  At the end of the case all sponge, needle, cottonoid, and instrument counts were correct.

## 2016-12-10 NOTE — Anesthesia Procedure Notes (Signed)
Procedure Name: Intubation Date/Time: 12/10/2016 10:42 AM Performed by: Willeen Cass P Pre-anesthesia Checklist: Patient identified, Emergency Drugs available, Suction available and Patient being monitored Patient Re-evaluated:Patient Re-evaluated prior to inductionOxygen Delivery Method: Circle system utilized Preoxygenation: Pre-oxygenation with 100% oxygen Intubation Type: IV induction Ventilation: Mask ventilation without difficulty Laryngoscope Size: Mac and 3 Grade View: Grade I Tube type: Oral Tube size: 7.0 mm Number of attempts: 1 Airway Equipment and Method: Stylet Placement Confirmation: ETT inserted through vocal cords under direct vision,  positive ETCO2,  CO2 detector and breath sounds checked- equal and bilateral Secured at: 22 cm Dental Injury: Teeth and Oropharynx as per pre-operative assessment

## 2016-12-10 NOTE — Consult Note (Signed)
   [facility] CM Inpatient Consult   12/10/2016  Kendra [name] [birth year] [id number]   Screened for potential needs for Link to [facility] Care Management program as benefit of being a [employer] employee/dependent [employer] insurance. Currently in surgery. Will follow up on Monday if still in hospital.   Marthenia Rolling, [place], RN,BSN [facility] Liaison [phone]

## 2016-12-10 NOTE — Anesthesia Postprocedure Evaluation (Signed)
Anesthesia Post Note  Patient: Kendra [name]  Procedure(s) Performed: Procedure(s) (LRB): BICORONAL CRANIOTOMY FOR RESECTION OF TUMOR (N/A) APPLICATION OF CRANIAL NAVIGATION (N/A)  Patient location during evaluation: PACU Anesthesia Type: General Level of consciousness: awake and alert Pain management: pain level controlled Vital Signs Assessment: post-procedure vital signs reviewed and stable Respiratory status: spontaneous breathing, nonlabored ventilation, respiratory function stable and patient connected to nasal cannula oxygen Cardiovascular status: blood pressure returned to baseline and stable Postop Assessment: no signs of nausea or vomiting Anesthetic complications: no       Last Vitals:  Vitals:   12/10/16 1720 12/10/16 1733  BP: 109/76 110/75  Pulse: 88 86  Resp: 16 16  Temp: 36.4 C 36.4 C    Last Pain:  Vitals:   12/10/16 1720  TempSrc:   PainSc: Asleep                 Kimbely Whiteaker S

## 2016-12-10 NOTE — Progress Notes (Signed)
Notified on-call neurosurgery MD that pt stated "I cannot take oral medication at this time b/c nausea." Received orders to change route of keppra medication from oral to IV piggy back. Verified w/ pharmacy the correct dosages from PO to IV. Also informed MD that pt has been complaining of numbness in left upper extremity - thinking it could be r/t numerous arterial sticks prior to surgery attempting to establish arterial line for BP management.   MD also informed RN that pt was safe to miss lamictal and gabapentin medications for 2200.  Will continue to monitor.   Guadalupe Maple, RN

## 2016-12-10 NOTE — Transfer of Care (Signed)
Immediate Anesthesia Transfer of Care Note  Patient: Kendra [name]  Procedure(s) Performed: Procedure(s) with comments: BICORONAL CRANIOTOMY FOR RESECTION OF TUMOR (N/A) - BICORONAL CRANIOTOMY FOR RESECTION OF TUMOR  APPLICATION OF CRANIAL NAVIGATION (N/A)  Patient Location: PACU  Anesthesia Type:General  Level of Consciousness: awake  Airway & Oxygen Therapy: Patient Spontanous Breathing and Patient connected to face mask oxygen  Post-op Assessment: Report given to RN, Post -op Vital signs reviewed and stable and Patient moving all extremities X 4  Post vital signs: Reviewed and stable  Last Vitals:  Vitals:   12/10/16 0811 12/10/16 1621  BP: 129/84 124/67  Pulse: 87 95  Resp: 18 11  Temp: 36.9 C 36.5 C    Last Pain:  Vitals:   12/10/16 0811  TempSrc: Oral         Complications: No apparent anesthesia complications

## 2016-12-11 ENCOUNTER — Inpatient Hospital Stay (HOSPITAL_COMMUNITY): Payer: 59

## 2016-12-11 LAB — POCT I-STAT 7, (LYTES, BLD GAS, ICA,H+H)
Acid-base deficit: 3 mmol/L — ABNORMAL HIGH (ref 0.0–2.0)
BICARBONATE: 22.9 mmol/L (ref 20.0–28.0)
Calcium, Ion: 1.14 mmol/L — ABNORMAL LOW (ref 1.15–1.40)
HCT: 36 % (ref 36.0–46.0)
HEMOGLOBIN: 12.2 g/dL (ref 12.0–15.0)
O2 SAT: 97 %
PCO2 ART: 40.6 mmHg (ref 32.0–48.0)
PO2 ART: 92 mmHg (ref 83.0–108.0)
Patient temperature: 35.3
Potassium: 4.4 mmol/L (ref 3.5–5.1)
Sodium: 138 mmol/L (ref 135–145)
TCO2: 24 mmol/L (ref 0–100)
pH, Arterial: 7.35 (ref 7.350–7.450)

## 2016-12-11 LAB — MRSA PCR SCREENING: MRSA BY PCR: NEGATIVE

## 2016-12-11 MED ORDER — SODIUM CHLORIDE 0.9% FLUSH
10.0000 mL | Freq: Two times a day (BID) | INTRAVENOUS | Status: DC
  Administered 2016-12-11 – 2016-12-12 (×2): 10 mL

## 2016-12-11 MED ORDER — CHLORHEXIDINE GLUCONATE CLOTH 2 % EX PADS: 6.0000 | MEDICATED_PAD | Freq: Every day | CUTANEOUS | Status: DC

## 2016-12-11 MED ORDER — SODIUM CHLORIDE 0.9% FLUSH: 10.0000 mL | Freq: Two times a day (BID) | INTRAVENOUS | Status: DC

## 2016-12-11 MED ORDER — GADOBENATE DIMEGLUMINE 529 MG/ML IV SOLN
20.0000 mL | Freq: Once | INTRAVENOUS | Status: AC | PRN
  Administered 2016-12-11: 20 mL via INTRAVENOUS

## 2016-12-11 MED ORDER — SODIUM CHLORIDE 0.9% FLUSH: 10.0000 mL | INTRAVENOUS | Status: DC | PRN

## 2016-12-11 NOTE — Progress Notes (Signed)
Patient transported to MRI 

## 2016-12-11 NOTE — Progress Notes (Signed)
Postop day 1. No events or problems overnight. Minimal headache. Patient conversant without complaints otherwise. Does note a little left forearm numbness.  Afebrile. Vital signs are stable. Urine output good. Awake and alert. Oriented and appropriate. Speech is fluent. Vision intact. Gaze conjugate. Extraocular movements full. Face symmetric. Motor and sensory function of the extremities normal. Wound clean and dry.  Follow-up MRI scan pending.  Doing well following bifrontal craniotomy and resection of anterior skull base tumor. Continue ICU observation.

## 2016-12-12 MED ORDER — DEXAMETHASONE 4 MG PO TABS
4.0000 mg | ORAL_TABLET | Freq: Once | ORAL | Status: AC
  Administered 2016-12-12: 4 mg via ORAL
  Filled 2016-12-12: qty 1

## 2016-12-12 MED ORDER — DEXAMETHASONE 4 MG PO TABS
4.0000 mg | ORAL_TABLET | Freq: Three times a day (TID) | ORAL | Status: DC
  Administered 2016-12-12 – 2016-12-13 (×2): 4 mg via ORAL
  Filled 2016-12-12 (×2): qty 1

## 2016-12-12 MED ORDER — PANTOPRAZOLE SODIUM 40 MG PO TBEC
40.0000 mg | DELAYED_RELEASE_TABLET | Freq: Every day | ORAL | Status: DC
  Administered 2016-12-12: 40 mg via ORAL
  Filled 2016-12-12: qty 1

## 2016-12-12 MED ORDER — LEVETIRACETAM 500 MG PO TABS
1000.0000 mg | ORAL_TABLET | Freq: Two times a day (BID) | ORAL | Status: DC
  Administered 2016-12-12 – 2016-12-13 (×2): 1000 mg via ORAL
  Filled 2016-12-12 (×2): qty 2

## 2016-12-12 NOTE — Progress Notes (Signed)
Patient ID: Kendra [name], female   DOB: 08-Oct-1985, 31 y.o.   MRN: [id number] Postop day 2. Patient doing very well. Minimal headache. No visual problems. Awake and alert. Oriented and appropriate. Speech fluent. Neurologic exam intact. Follow-up MRI scan yesterday looked good. No evidence of residual tumor. No evidence of stroke or hemorrhage.  Transfer to floor. Continue efforts at mobilization. Possible discharge early this week.

## 2016-12-13 LAB — TYPE AND SCREEN
ABO/RH(D): A POS
ANTIBODY SCREEN: NEGATIVE
UNIT DIVISION: 0
Unit division: 0

## 2016-12-13 LAB — BPAM RBC
BLOOD PRODUCT EXPIRATION DATE: 201805042359
Blood Product Expiration Date: 201805042359
ISSUE DATE / TIME: 201804131046
ISSUE DATE / TIME: 201804131046
Unit Type and Rh: 6200
Unit Type and Rh: 6200

## 2016-12-13 MED ORDER — HYDROCODONE-ACETAMINOPHEN 5-325 MG PO TABS: 1.0000 | ORAL_TABLET | ORAL | 0 refills | Status: DC | PRN

## 2016-12-13 MED ORDER — PREDNISONE 10 MG (21) PO TBPK: ORAL_TABLET | ORAL | 0 refills | Status: DC

## 2016-12-13 MED ORDER — LEVETIRACETAM 500 MG PO TABS: 1000.0000 mg | ORAL_TABLET | Freq: Two times a day (BID) | ORAL | 1 refills | Status: DC

## 2016-12-13 MED FILL — Thrombin For Soln 5000 Unit: CUTANEOUS | Qty: 5000 | Status: AC

## 2016-12-13 NOTE — Consult Note (Signed)
   [facility] CM Inpatient Consult   12/13/2016  Kendra [name] 04/18/1986 [id number]     Spoke with Ms. Rosen' nurse prior to bedside visit. Came to speak with Kendra [name] prior to discharge on behalf of [place] to Wellness program for [employer] employees/dependents with [facility] insurance. [place] works at [employer]. Explained Link to [employer]. She is denies having any Link to Wellness needs at this time. However, she is agreeable to post hospital discharge call. Confirmed best contact number as [phone]. She also gave her mother's number Kendra [name] at [phone] for a secondary number to reach Kendra [name] post hospital discharge. Provided Link to [employer] and contact information. Appreciative of visit.   Marthenia Rolling, MSN-Ed, RN,BSN [facility] Liaison [phone]

## 2016-12-13 NOTE — Progress Notes (Signed)
No issues overnight. Pt has minimal HA, no other complaints. No visual changes or weakness.  EXAM:  BP 116/76 (BP Location: Left Arm)   Pulse 69   Temp 98 F (36.7 C) (Oral)   Resp 16   Ht 5\' 1"  (1.549 m)   Wt 101.5 kg (223 lb 14 oz)   LMP 11/15/2016 (Exact Date)   SpO2 97%   BMI 42.30 kg/m   Awake, alert, oriented  Speech fluent, appropriate  CN grossly intact  5/5 BUE/BLE  Wound c/d/i  IMPRESSION:  31 y.o. female POD#3 s/p birontal crani for skull base tumor, doing well  PLAN: - d/c home today

## 2016-12-13 NOTE — Discharge Summary (Signed)
Physician Discharge Summary  Patient ID: KIOWA PEIFER MRN: [id number] DOB/AGE: 02/27/1986 31 y.o.  Admit date: 12/10/2016 Discharge date: 12/13/2016  Admission Diagnoses:  Meningioma  Discharge Diagnoses:  Same Active Problems:   Meningioma [facility])   Discharged Condition: Stable  Hospital Course:  Kendra [name] is a 31 y.o. female electively admitted for craniotomy and resection of skull base tumor. She was at baseline postoperatively. MRI did not demonstrate any residual tumor. She was ambulating well, tolerating diet, voiding normally, pain under control.  Treatments: Surgery - Craniotomy for resection of tumor  Discharge Exam: Blood pressure 114/62, pulse 74, temperature 98 F (36.7 C), temperature source Oral, resp. rate 16, height 5\' 1"  (1.549 m), weight 101.5 kg (223 lb 14 oz), last menstrual period 11/15/2016, SpO2 98 %, not currently breastfeeding. Awake, alert, oriented Speech fluent, appropriate CN grossly intact 5/5 BUE/BLE Wound c/d/i  Disposition: 01-Home or Self Care  Discharge Instructions    Call MD for:  redness, tenderness, or signs of infection (pain, swelling, redness, odor or green/yellow discharge around incision site)    Complete by:  As directed    Call MD for:  temperature >100.4    Complete by:  As directed    Diet - low sodium heart healthy    Complete by:  As directed    Discharge instructions    Complete by:  As directed    Walk at home as much as possible, at least 4 times / day   Increase activity slowly    Complete by:  As directed    Lifting restrictions    Complete by:  As directed    No lifting > 10 lbs   May shower / Bathe    Complete by:  As directed    48 hours after surgery   May walk up steps    Complete by:  As directed    No dressing needed    Complete by:  As directed    Other Restrictions    Complete by:  As directed    No bending/twisting at waist     Allergies as of 12/13/2016      Reactions   Asa Buff (mag  [buffered Aspirin] Anaphylaxis   Erythromycin Anaphylaxis   Iodine Anaphylaxis, Hives   Peanut-containing Drug Products Anaphylaxis, Hives   Pt is allergic to peanut oil   Shellfish Allergy Anaphylaxis, Hives, Swelling, Other (See Comments)   Pt throat swells      Medication List    TAKE these medications   acetaminophen 500 MG tablet Commonly known as:  TYLENOL Take 1,000 mg by mouth every 6 (six) hours as needed (for pain/headaches.).   albuterol 108 (90 Base) MCG/ACT inhaler Commonly known as:  PROVENTIL HFA;VENTOLIN HFA Inhale 2 puffs into the lungs every 6 (six) hours as needed for wheezing or shortness of breath.   diphenhydrAMINE 25 mg capsule Commonly known as:  BENADRYL Take 1 capsule (25 mg total) by mouth every 6 (six) hours as needed.   EPINEPHrine 0.3 mg/0.3 mL Soaj injection Commonly known as:  EPI-PEN Inject 0.3 mg into the muscle daily as needed (for anaphylatic allergic reactions).   gabapentin 100 MG capsule Commonly known as:  NEURONTIN Take 1 capsule (100 mg total) by mouth 3 (three) times daily. What changed:  when to take this   HYDROcodone-acetaminophen 5-325 MG tablet Commonly known as:  NORCO/VICODIN Take 1 tablet by mouth every 4 (four) hours as needed. What changed:  Another medication with the  same name was added. Make sure you understand how and when to take each.   HYDROcodone-acetaminophen 5-325 MG tablet Commonly known as:  NORCO/VICODIN Take 1-2 tablets by mouth every 6 (six) hours as needed for moderate pain. What changed:  Another medication with the same name was added. Make sure you understand how and when to take each.   HYDROcodone-acetaminophen 5-325 MG tablet Commonly known as:  NORCO/VICODIN Take 1 tablet by mouth every 4 (four) hours as needed for moderate pain. What changed:  You were already taking a medication with the same name, and this prescription was added. Make sure you understand how and when to take each.    lamoTRIgine 100 MG tablet Commonly known as:  LAMICTAL Take 1 tablet (100 mg total) by mouth 2 (two) times daily.   levETIRAcetam 500 MG tablet Commonly known as:  KEPPRA Take 2 tablets (1,000 mg total) by mouth 2 (two) times daily. What changed:  how much to take   levonorgestrel 20 MCG/24HR IUD Commonly known as:  MIRENA 1 each by Intrauterine route once.   LINZESS 290 MCG Caps capsule Generic drug:  linaclotide Take 290 mcg by mouth daily as needed (constipation).   ondansetron 4 MG disintegrating tablet Commonly known as:  ZOFRAN ODT Take 1 tablet (4 mg total) by mouth every 8 (eight) hours as needed.   predniSONE 10 MG (21) Tbpk tablet Commonly known as:  STERAPRED UNI-PAK 21 TAB Take tapering dose as directed on package      Follow-up Information    Odyssey Vasbinder, C, MD Follow up in 2 week(s).   Specialty:  Neurosurgery Contact information: [address] [place] [address] [phone]           Signed: Consuella Lose, Loletha Grayer 12/13/2016, 8:54 AM

## 2016-12-14 ENCOUNTER — Encounter (HOSPITAL_COMMUNITY): Payer: Self-pay | Admitting: Neurosurgery

## 2016-12-17 ENCOUNTER — Other Ambulatory Visit: Payer: Self-pay | Admitting: *Deleted

## 2016-12-17 ENCOUNTER — Encounter: Payer: Self-pay | Admitting: *Deleted

## 2016-12-17 NOTE — Patient Outreach (Signed)
Kendra [name]) [name] [name]  12/17/2016  Kendra [name] [birth year] [id number]  Subjective: Telephone call to patient's home number, spoke with patient, and HIPAA verified.  Discussed Kendra [name] [name] [name] [facility] Transition of [name] follow up, patient voiced understanding, and is in agreement to complete follow up. Patient states she is doing much better, staying with mother, who is providing assistance as needed, and has a follow up appointment scheduled with surgeon on 12/27/16.   Patient gave RNCM verbal consent to speak with god mother Kendra [name] ) regarding her healthcare.  Spoke with Kendra [name] discussed [name] [name] services and she did not have any other questions for this RNCM.  Medication review completed, patient did not get prescriptiion filled for Lamictal and Neurontin, she will follow up with pharmacy & MD if needed.  RNCM advised patient of [employer] Employee outpatient pharmacy benefits, provided contact number [phone]) for [place],  patient voices understanding, states she has not been utilizing this benefit, pay higher cost for medications, is very appreciative of the information, and states she will follow up to get medications transferred from her local pharmacy to [facility] outpatient.  Patient states per [employer] family medical leave act (FMLA) will go into effect for her as of 12/28/16 due employment start date on 12/29/15.  States she does not have the [name] indemnity supplemental insurance benefit.  Patient states she does not have any transition of [name], [name] coordination, disease [name], disease monitoring, transportation, community resource, or pharmacy needs at this time.  States she is very appreciative of the follow up and is in agreement to receive Kendra [name] information.   Objective: Per chart review, hospitalized 12/10/16 - 12/13/16 for Meningioma.  Status post Craniotomy for resection of tumor on 12/10/16.  Patient's health [name]  power of attorney and living will in chart.     Assessment: Received UMR Transition of [name] referral on 12/14/16.  Transition of [name] follow up completed, no [name] [name] needs, and will proceed with case closure.    Plan: RNCM will send patient successful outreach letter, Kendra [name] pamphlet, and magnet. RNCM will send case closure due to follow up completed / no [name] [name] needs request to Kendra [name] at [place] [name].   Kendra [name] H. Annia Friendly, BSN, [place] [name] Telephonic CM Phone: [phone] Fax: [phone]

## 2016-12-20 ENCOUNTER — Ambulatory Visit: Payer: 59 | Admitting: Allergy & Immunology

## 2016-12-20 ENCOUNTER — Other Ambulatory Visit: Payer: Self-pay | Admitting: *Deleted

## 2017-01-03 DIAGNOSIS — J4540 Moderate persistent asthma, uncomplicated: Secondary | ICD-10-CM | POA: Diagnosis not present

## 2017-01-03 DIAGNOSIS — T781XXA Other adverse food reactions, not elsewhere classified, initial encounter: Secondary | ICD-10-CM | POA: Diagnosis not present

## 2017-01-03 DIAGNOSIS — Z91013 Allergy to seafood: Secondary | ICD-10-CM | POA: Diagnosis not present

## 2017-01-03 DIAGNOSIS — J3089 Other allergic rhinitis: Secondary | ICD-10-CM | POA: Diagnosis not present

## 2017-01-17 ENCOUNTER — Ambulatory Visit: Payer: 59 | Admitting: Adult Health

## 2017-01-18 ENCOUNTER — Encounter: Payer: Self-pay | Admitting: Adult Health

## 2017-01-31 NOTE — Addendum Note (Signed)
Addendum  created 01/31/17 1318 by Khila Papp, MD   Sign clinical note    

## 2017-01-31 NOTE — Anesthesia Postprocedure Evaluation (Signed)
Anesthesia Post Note  Patient: Kendra [name]  Procedure(s) Performed: Procedure(s) (LRB): BICORONAL CRANIOTOMY FOR RESECTION OF TUMOR (N/A) APPLICATION OF CRANIAL NAVIGATION (N/A)     Anesthesia Post Evaluation  Last Vitals:  Vitals:   12/13/16 0329 12/13/16 0800  BP: 116/76 114/62  Pulse: 69 74  Resp: 16   Temp:      Last Pain:  Vitals:   12/13/16 0800  TempSrc:   PainSc: 6                  Akela Pocius S

## 2017-04-25 ENCOUNTER — Encounter (HOSPITAL_COMMUNITY): Payer: Self-pay

## 2017-04-25 ENCOUNTER — Emergency Department (HOSPITAL_COMMUNITY)
Admission: EM | Admit: 2017-04-25 | Discharge: 2017-04-26 | Disposition: A | Discharge status: Home / Self Care | Payer: 59 | Attending: Emergency Medicine | Admitting: Emergency Medicine

## 2017-04-25 DIAGNOSIS — J45909 Unspecified asthma, uncomplicated: Secondary | ICD-10-CM | POA: Insufficient documentation

## 2017-04-25 DIAGNOSIS — Z91010 Allergy to peanuts: Secondary | ICD-10-CM | POA: Insufficient documentation

## 2017-04-25 DIAGNOSIS — R569 Unspecified convulsions: Secondary | ICD-10-CM | POA: Diagnosis not present

## 2017-04-25 DIAGNOSIS — Z79899 Other long term (current) drug therapy: Secondary | ICD-10-CM | POA: Insufficient documentation

## 2017-04-25 LAB — CBG MONITORING, ED: Glucose-Capillary: 117 mg/dL — ABNORMAL HIGH (ref 65–99)

## 2017-04-25 MED ORDER — LEVETIRACETAM 500 MG PO TABS: 1000.0000 mg | ORAL_TABLET | Freq: Once | ORAL | Status: DC

## 2017-04-25 MED ORDER — LORAZEPAM 2 MG/ML IJ SOLN
INTRAMUSCULAR | Status: AC
  Filled 2017-04-25: qty 1

## 2017-04-25 MED ORDER — SODIUM CHLORIDE 0.9 % IV SOLN
1000.0000 mg | Freq: Once | INTRAVENOUS | Status: AC
  Administered 2017-04-25: 1000 mg via INTRAVENOUS
  Filled 2017-04-25: qty 10

## 2017-04-25 MED ORDER — LORAZEPAM 2 MG/ML IJ SOLN
1.0000 mg | Freq: Once | INTRAMUSCULAR | Status: AC
  Administered 2017-04-25: 1 mg via INTRAVENOUS

## 2017-04-25 MED ORDER — SODIUM CHLORIDE 0.9 % IV BOLUS (SEPSIS)
1000.0000 mL | Freq: Once | INTRAVENOUS | Status: AC
  Administered 2017-04-25: 1000 mL via INTRAVENOUS

## 2017-04-25 MED FILL — SYMBICORT 80-4.5 MCG INH: 80-4.5 | 30 days supply | Qty: 10 | Fill #0

## 2017-04-25 NOTE — ED Notes (Signed)
Bed: WA03 Expected date:  Expected time:  Means of arrival:  Comments: 31 yr old F, seizure

## 2017-04-25 NOTE — ED Triage Notes (Signed)
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 Patient reports her last seizure was in April 2018. Patient was sitting in chair when she experienced witnessed seizure, gran mal, lasting approx 1-2 min. Patient's family states the patient did not hit her head or fall out of the chair in which she was sitting. Patient reports she is supposed to be taking an antiseizure medication, but states she stopped taking it 2-3 weeks ago. Patient unsure of the name of the medication. Per report, patient was post ichtal when EMS arrived on scene- patient is AxOx4 in triage. CBG for EMS was 99.

## 2017-04-25 NOTE — ED Provider Notes (Signed)
[place] DEPT Provider Note   CSN: [id number] Arrival date & time: 04/25/17  2044     History   Chief Complaint Chief Complaint  Patient presents with  . Seizures    HPI Kendra [name] is a 31 y.o. female.  HPI  31 y.o. female with a hx of Seizures, Meningioma, presents to the Emergency Department today due to witnessed seizure. This was removed in April 2018. Last seizure was April 2018. Per mother, pt was sitting upright in chair around 1930 and had witnessed grand-mal seizure lasting 1-2 minutes. Mother denies any fall or trauma. On arrival to ED, patient had another gran mal seizure lasting 1-2 minutes around 2115. Urinary incontinence noted. Pt back to baseline after seizure. States that she has not been taking Keppra medication x 1-2 weeks due to not having seizure since tumor removal. Denies N/V/D. No CP/SOB/ABD pain. No headaches. No numbness/tingling. No fevers. No other symptoms noted   Seizure medications: Keppra 1000mg  BID Lamictal 100mg  BID  Past Medical History:  Diagnosis Date  . Asthma   . Convulsions/seizures ([place]) 07/27/2016  . H/O: C-section   . Lipoma   . Lipoma of LUQ abdomen, s/p removal 06/27/2012 05/24/2012  . Meningioma ([place]) 07/27/2016   Anterior fossa    Patient Active Problem List   Diagnosis Date Noted  . Convulsions/seizures ([place]) 07/27/2016  . Meningioma ([place]) 07/27/2016  . Common migraine with intractable migraine 07/27/2016  . Status post repeat low transverse cesarean section 01/14/2015  . Lipoma of LUQ abdomen, s/p removal 06/27/2012 05/24/2012  . 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 Chlamydia infection 12/13/2011  . 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 APPLICATION OF CRANIAL NAVIGATION N/A 12/10/2016   Procedure: APPLICATION OF CRANIAL NAVIGATION;  Surgeon: Consuella Lose, MD;  Location: [place];  Service: Neurosurgery;  Laterality: N/A;  .  BREAST SURGERY  12/30/11   breast reduction  . CESAREAN SECTION  09/09/2010   [place]  . CESAREAN SECTION N/A 01/13/2015   Procedure: CESAREAN SECTION;  Surgeon: Janyth Pupa, DO;  Location: [facility];  Service: Obstetrics;  Laterality: N/A;  EDD 01/18/15 would like [place] dressing  . CHOLECYSTECTOMY N/A 11/23/2016   Procedure: LAPAROSCOPIC CHOLECYSTECTOMY WITH INTRAOPERATIVE CHOLANGIOGRAM;  Surgeon: Donnie Mesa, MD;  Location: [place];  Service: General;  Laterality: N/A;  . CRANIOTOMY N/A 12/10/2016   Procedure: BICORONAL CRANIOTOMY FOR RESECTION OF TUMOR;  Surgeon: Consuella Lose, MD;  Location: [place];  Service: Neurosurgery;  Laterality: N/A;  BICORONAL CRANIOTOMY FOR RESECTION OF TUMOR   . LYMPH NODE DISSECTION  2013   Abdominal    OB History    Gravida Para Term Preterm AB Living   2 2 2     2    SAB TAB Ectopic Multiple Live Births         0 1       Home Medications    Prior to Admission medications   Medication Sig Start Date End Date Taking? Authorizing Provider  acetaminophen (TYLENOL) 500 MG tablet Take 1,000 mg by mouth every 6 (six) hours as needed (for pain/headaches.).    [provider]  albuterol (PROVENTIL HFA;VENTOLIN HFA) 108 (90 BASE) MCG/ACT inhaler Inhale 2 puffs into the lungs every 6 (six) hours as needed for wheezing or shortness of breath.    [provider]  diphenhydrAMINE (BENADRYL) 25 mg capsule Take 1 capsule (25 mg total) by mouth every 6 (six) hours as  needed. 07/17/16   Mackuen, Courteney Lyn, MD  EPINEPHrine 0.3 mg/0.3 mL IJ SOAJ injection Inject 0.3 mg into the muscle daily as needed (for anaphylatic allergic reactions).     [provider]  famotidine (PEPCID) 20 MG tablet  11/05/16   [provider]  gabapentin (NEURONTIN) 100 MG capsule Take 1 capsule (100 mg total) by mouth 3 (three) times daily. Patient taking differently: Take 100 mg by mouth 2 (two) times daily.  10/21/16   Kathrynn Ducking, MD    HYDROcodone-acetaminophen (NORCO/VICODIN) 5-325 MG tablet Take 1 tablet by mouth every 4 (four) hours as needed. Patient not taking: Reported on 12/17/2016 11/15/16   Isla Pence, MD  HYDROcodone-acetaminophen (NORCO/VICODIN) 5-325 MG tablet Take 1-2 tablets by mouth every 6 (six) hours as needed for moderate pain. Patient not taking: Reported on 12/02/2016 11/23/16   Donnie Mesa, MD  HYDROcodone-acetaminophen (NORCO/VICODIN) 5-325 MG tablet Take 1 tablet by mouth every 4 (four) hours as needed for moderate pain. 12/13/16   Consuella Lose, MD  lamoTRIgine (LAMICTAL) 100 MG tablet Take 1 tablet (100 mg total) by mouth 2 (two) times daily. Patient not taking: Reported on 12/17/2016 12/03/16   Kathrynn Ducking, MD  levETIRAcetam (KEPPRA) 500 MG tablet Take 2 tablets (1,000 mg total) by mouth 2 (two) times daily. 12/13/16   Consuella Lose, MD  levonorgestrel (MIRENA) 20 MCG/24HR IUD 1 each by Intrauterine route once.    [provider]  LINZESS 290 MCG CAPS capsule Take 290 mcg by mouth daily as needed (constipation).  07/16/16   [provider]  ondansetron (ZOFRAN ODT) 4 MG disintegrating tablet Take 1 tablet (4 mg total) by mouth every 8 (eight) hours as needed. Patient not taking: Reported on 12/02/2016 11/15/16   Isla Pence, MD  predniSONE (DELTASONE) 10 MG tablet  12/13/16   [provider]    Family History Family History  Problem Relation Age of Onset  . Kidney disease Mother        kidney stones  . Cancer Maternal Grandmother        breast  . Cancer Maternal Grandfather        Colon  . Deep vein thrombosis Maternal Grandfather        in throat  . Cancer Maternal Aunt        breast  . Heart disease Maternal Uncle   . Stroke Maternal Uncle   . Liver cancer Maternal Uncle   . Diabetes Paternal Grandfather     Social History Social History  Substance Use Topics  . Smoking status: Never Smoker  . Smokeless tobacco: Never Used  . Alcohol use  No     Allergies   Asa buff (mag [buffered aspirin]; Erythromycin; Iodine; Peanut-containing drug products; and Shellfish allergy   Review of Systems Review of Systems ROS reviewed and all are negative for acute change except as noted in the HPI.  Physical Exam Updated Vital Signs BP 113/61 (BP Location: Right Arm)   Pulse 98   Temp 98.7 F (37.1 C) (Oral)   Resp 18   Ht 5\' 2"  (1.575 m)   Wt 101.2 kg (223 lb)   SpO2 95%   BMI 40.79 kg/m   Physical Exam  Constitutional: She is oriented to person, place, and time. Vital signs are normal. She appears well-developed and well-nourished.  Pt easily awakened. Responds to questioning appropriately   HENT:  Head: Normocephalic and atraumatic.  Right Ear: Hearing normal.  Left Ear: Hearing normal.  Eyes:  Pupils are equal, round, and reactive to light. Conjunctivae and EOM are normal.  Neck: Normal range of motion. Neck supple.  Cardiovascular: Normal rate, regular rhythm, normal heart sounds and intact distal pulses.   Pulmonary/Chest: Effort normal and breath sounds normal.  Abdominal: Soft.  Musculoskeletal: Normal range of motion.  Neurological: She is alert and oriented to person, place, and time. No cranial nerve deficit or sensory deficit.  Cranial Nerves:  II: Pupils equal, round, reactive to light III,IV, VI: ptosis not present, extra-ocular motions intact bilaterally  V,VII: smile symmetric, facial light touch sensation equal VIII: hearing grossly normal bilaterally  IX,X: midline uvula rise  XI: bilateral shoulder shrug equal and strong XII: midline tongue extension  Skin: Skin is warm and dry.  Psychiatric: She has a normal mood and affect. Her speech is normal and behavior is normal. Thought content normal.  Nursing note and vitals reviewed.  ED Treatments / Results  Labs (all labs ordered are listed, but only abnormal results are displayed) Labs Reviewed  CBG MONITORING, ED - Abnormal; Notable for the  following:       Result Value   Glucose-Capillary 117 (*)    All other components within normal limits    EKG  EKG Interpretation None      Radiology No results found.  Procedures Procedures (including critical care time)  Medications Ordered in ED Medications  LORazepam (ATIVAN) injection 1 mg (1 mg Intravenous Given 04/25/17 2131)  levETIRAcetam (KEPPRA) 1,000 mg in sodium chloride 0.9 % 100 mL IVPB (0 mg Intravenous Stopped 04/25/17 2222)  sodium chloride 0.9 % bolus 1,000 mL (1,000 mLs Intravenous New Bag/Given 04/25/17 2229)   Initial Impression / Assessment and Plan / ED Course  I have reviewed the triage vital signs and the nursing notes.  Pertinent labs & imaging results that were available during my care of the patient were reviewed by me and considered in my medical decision making (see chart for details).  Final Clinical Impressions(s) / ED Diagnoses  {I have reviewed and evaluated the relevant laboratory values.   {I have reviewed the relevant previous healthcare records.  {I obtained HPI from historian. {Patient discussed with supervising physician.  ED Course:  Assessment: Pt is a 31 y.o. female with a hx of Seizures, Meningioma, presents to the Emergency Department today due to witnessed seizure. This was removed in April 2018. Last seizure was April 2018. Per mother, pt was sitting upright in chair around 1930 and had witnessed grand-mal seizure lasting 1-2 minutes. Mother denies any fall or trauma. On arrival to ED, patient had another gran mal seizure lasting 1-2 minutes around 2115. Urinary incontinence noted. Pt back to baseline after seizure. States that she has not been taking Keppra medication x 1-2 weeks. Denies N/V/D. No CP/SOB/ABD pain. No headaches. No numbness/tingling. No fevers. On exam, pt in NAD. Nontoxic/nonseptic appearing. VSS. Afebrile. Lungs CTA. Heart RRR. Abdomen nontender soft. Pt baseline after seizure that was witnessed in ED. During event, I  held patient while nurse retrieved Ativan. Verbally ordered 1mg . Given 1mg  Ativan during event as well as 1g Keppra IV. Order review of Ativan that was verbally given showed 2mg . I had noticed this order placed and DCed the Ativan and replaced with 1mg , but the medication was already given. Observed in ED without reoccurrence. Seen by Supervising physician. Discussed that CT Scan of head unnecessary as pt has been non compliant with seizure medication which likely caused seizure. Encouraged patient to continue anti-seizure medication and follow  up with PCP. Plan is to observe in ED until patient more arousalable after Ativan. DC home with follow up to Neurologist. At time of discharge, Patient is in no acute distress. Vital Signs are stable. Patient is able to ambulate. Patient able to tolerate PO.   Disposition/Plan:  [place] 12:40 AM Sign out to Armstead Peaks, PA-C Pt to f/u with Neurologist in the next week for evaluation and treatment of symptoms. Return precautions given Pt acknowledges and agrees with plan  Supervising Physician Orlie Dakin, MD  Final diagnoses:  Seizure [facility])    New Prescriptions New Prescriptions   No medications on file       Shary Decamp, Hershal Coria 04/26/17 [place], [place], MD 04/27/17 1140

## 2017-04-25 NOTE — ED Notes (Signed)
Pt's CBG=117

## 2017-04-25 NOTE — ED Notes (Signed)
Patient remains post-ictal and is responsive to pain only. Patient's husband at bedside and has been updated on patient's POC.

## 2017-04-25 NOTE — ED Notes (Signed)
Upon arrival via EMS, patient was AxOx4. This nurse stepped out of the room to get tubes for IV blood draw, and stepped back into the room to find the patient seizing. The seizure lasted 3 minutes 17seconds. Patient placed in left lateral recumbant position and patient's airway was secured. Suction was set up at bedside if it was needed. Patient did have an episode of incontinence on herself. Seizure pads placed on bed.EDPA arrived as the patient's seizure was ending. 1mg  IV ativan administered per verbal order. Patient's mother was at the bedside during this and was updated by EDPA on patient's POC. Patient post-ictal at this time, but is responsive to pain. Will continue to monitor closely.

## 2017-04-25 NOTE — ED Provider Notes (Signed)
Patient with 2 seizures today. One prior to coming here and a second one here. Patient had told nurse that  she had not taken her Keppra in 2 weeks because she had not had any seizures.   Orlie Dakin, MD 04/25/17 2216

## 2017-04-26 DIAGNOSIS — Z91010 Allergy to peanuts: Secondary | ICD-10-CM | POA: Diagnosis not present

## 2017-04-26 DIAGNOSIS — R569 Unspecified convulsions: Secondary | ICD-10-CM | POA: Diagnosis not present

## 2017-04-26 DIAGNOSIS — Z79899 Other long term (current) drug therapy: Secondary | ICD-10-CM | POA: Diagnosis not present

## 2017-04-26 DIAGNOSIS — J45909 Unspecified asthma, uncomplicated: Secondary | ICD-10-CM | POA: Diagnosis not present

## 2017-04-26 MED FILL — levETIRAcetam 500 MG TABS: 500 | 15 days supply | Qty: 60 | Fill #0

## 2017-04-26 NOTE — ED Provider Notes (Signed)
Sign out from Shary Decamp, PA-C at shift change  Hx of brain tumor resection earlier this year; was on Keppra but had not taken in 2 weeks  Had seizure PTA today and another in ED, given Ativan and Keppra  Observe until able to walk, d/c home to resume anti-seizure meds, has meds at home  Patient ambulatory prior to discharge. Patient will go home with husband. Patient advised again to resume taking anti-seizure medications. Patient understands and agrees with plan. Patient discharged in satisfactory condition.   Frederica Kuster, PA-C 04/26/17 [place], Delice Bison, DO 04/26/17 [phone]

## 2017-04-26 NOTE — Discharge Instructions (Signed)
Please read and follow all provided instructions.  Your diagnoses today include:  1. Seizure ([place])    Tests performed today include: Vital signs. See below for your results today.   Medications prescribed:  Take as prescribed. PLEASE TAKE YOUR SEIZURE [place] care instructions:  Follow any educational materials contained in this packet.  Follow-up instructions: Please follow-up with your primary care provider for further evaluation of symptoms and treatment   Return instructions:  Please return to the Emergency Department if you do not get better, if you get worse, or new symptoms OR  - Fever (temperature greater than 101.8F)  - Bleeding that does not stop with holding pressure to the area    -Severe pain (please note that you may be more sore the day after your accident)  - Chest Pain  - Difficulty breathing  - Severe nausea or vomiting  - Inability to tolerate food and liquids  - Passing out  - Skin becoming red around your wounds  - Change in mental status (confusion or lethargy)  - New numbness or weakness    Please return if you have any other emergent concerns.  Additional Information:  Your vital signs today were: BP 117/82    Pulse (!) 109    Temp 98.7 F (37.1 C) (Oral)    Resp (!) 25    Ht 5\' 2"  (1.575 m)    Wt 101.2 kg (223 lb)    SpO2 95%    BMI 40.79 kg/m  If your blood pressure (BP) was elevated above 135/85 this visit, please have this repeated by your doctor within one month. ---------------

## 2017-05-03 ENCOUNTER — Encounter: Payer: Self-pay | Admitting: Adult Health

## 2017-05-03 ENCOUNTER — Ambulatory Visit (INDEPENDENT_AMBULATORY_CARE_PROVIDER_SITE_OTHER): Payer: 59 | Admitting: Adult Health

## 2017-05-03 VITALS — BP 118/80 | HR 85 | Wt 231.6 lb

## 2017-05-03 DIAGNOSIS — Z86011 Personal history of benign neoplasm of the brain: Secondary | ICD-10-CM

## 2017-05-03 DIAGNOSIS — R569 Unspecified convulsions: Secondary | ICD-10-CM

## 2017-05-03 MED ORDER — LAMOTRIGINE 25 MG PO TABS: ORAL_TABLET | ORAL | 5 refills | Status: DC

## 2017-05-03 MED FILL — lamoTRIgine 25 MG TABS: 25 | 30 days supply | Qty: 180 | Fill #0

## 2017-05-03 NOTE — Patient Instructions (Addendum)
Your Plan:  Continue Keppra 500 mg twice a day Start Lamictal 25 mg:  Week1 : 1 tablet twice a day Week 2 : 2 tablets twice a day Week 3: 3 tablets twice a day Then call Kendra [name].  You should not drive, operate heavy machinery, perform activities at heights or participate in water activities until 6 months seizure free  Thank you for coming to see Kendra [name] at [facility]. I hope we have been able to provide you high quality care today.  You may receive a patient satisfaction survey over the next few weeks. We would appreciate your feedback and comments so that we may continue to improve ourselves and the health of our patients.  Lamotrigine tablets What is this medicine? LAMOTRIGINE (la MOE [place] Limes) is used to control seizures in adults and children with epilepsy and Lennox-Gastaut syndrome. It is also used in adults to treat bipolar disorder. This medicine may be used for other purposes; ask your health care provider or pharmacist if you have questions. COMMON BRAND NAME(S): Lamictal What should I tell my health care provider before I take this medicine? They need to know if you have any of these conditions: -a history of depression or bipolar disorder -aseptic meningitis during prior use of lamotrigine -folate deficiency -kidney disease -liver disease -suicidal thoughts, plans, or attempt; a previous suicide attempt by you or a family member -an unusual or allergic reaction to lamotrigine or other seizure medications, other medicines, foods, dyes, or preservatives -pregnant or trying to get pregnant -breast-feeding How should I use this medicine? Take this medicine by mouth with a glass of water. Follow the directions on the prescription label. Do not chew these tablets. If this medicine upsets your stomach, take it with food or milk. Take your doses at regular intervals. Do not take your medicine more often than directed. A special MedGuide will be given to you by the  pharmacist with each new prescription and refill. Be sure to read this information carefully each time. Talk to your pediatrician regarding the use of this medicine in children. While this drug may be prescribed for children as young as 2 years for selected conditions, precautions do apply. Overdosage: If you think you have taken too much of this medicine contact a poison control center or emergency room at once. NOTE: This medicine is only for you. Do not share this medicine with others. What if I miss a dose? If you miss a dose, take it as soon as you can. If it is almost time for your next dose, take only that dose. Do not take double or extra doses. What may interact with this medicine? -carbamazepine -female hormones, including contraceptive or birth control pills -methotrexate -phenobarbital -phenytoin -primidone -pyrimethamine -rifampin -trimethoprim -valproic acid This list may not describe all possible interactions. Give your health care provider a list of all the medicines, herbs, non-prescription drugs, or dietary supplements you use. Also tell them if you smoke, drink alcohol, or use illegal drugs. Some items may interact with your medicine. What should I watch for while using this medicine? Visit your doctor or health care professional for regular checks on your progress. If you take this medicine for seizures, wear a Medic Alert bracelet or necklace. Carry an identification card with information about your condition, medicines, and doctor or health care professional. It is important to take this medicine exactly as directed. When first starting treatment, your dose will need to be adjusted slowly. It may take weeks or months  before your dose is stable. You should contact your doctor or health care professional if your seizures get worse or if you have any new types of seizures. Do not stop taking this medicine unless instructed by your doctor or health care professional. Stopping  your medicine suddenly can increase your seizures or their severity. Contact your doctor or health care professional right away if you develop a rash while taking this medicine. Rashes may be very severe and sometimes require treatment in the hospital. Deaths from rashes have occurred. Serious rashes occur more often in children than adults taking this medicine. It is more common for these serious rashes to occur during the first 2 months of treatment, but a rash can occur at any time. You may get drowsy, dizzy, or have blurred vision. Do not drive, use machinery, or do anything that needs mental alertness until you know how this medicine affects you. To reduce dizzy or fainting spells, do not sit or stand up quickly, especially if you are an older patient. Alcohol can increase drowsiness and dizziness. Avoid alcoholic drinks. If you are taking this medicine for bipolar disorder, it is important to report any changes in your mood to your doctor or health care professional. If your condition gets worse, you get mentally depressed, feel very hyperactive or manic, have difficulty sleeping, or have thoughts of hurting yourself or committing suicide, you need to get help from your health care professional right away. If you are a caregiver for someone taking this medicine for bipolar disorder, you should also report these behavioral changes right away. The use of this medicine may increase the chance of suicidal thoughts or actions. Pay special attention to how you are responding while on this medicine. Your mouth may get dry. Chewing sugarless gum or sucking hard candy, and drinking plenty of water may help. Contact your doctor if the problem does not go away or is severe. Women who become pregnant while using this medicine may enroll in the [place] Pregnancy Registry by calling [id number]. This registry collects information about the safety of antiepileptic drug use during  pregnancy. What side effects may I notice from receiving this medicine? Side effects that you should report to your doctor or health care professional as soon as possible: -allergic reactions like skin rash, itching or hives, swelling of the face, lips, or tongue -blurred or double vision -difficulty walking or controlling muscle movements -fever -headache, stiff neck, and sensitivity to light -painful sores in the mouth, eyes, or nose -redness, blistering, peeling or loosening of the skin, including inside the mouth -severe muscle pain -swollen lymph glands -uncontrollable eye movements -unusual bruising or bleeding -unusually weak or tired -vomiting -worsening of mood, thoughts or actions of suicide or dying -yellowing of the eyes or skin Side effects that usually do not require medical attention (report to your doctor or health care professional if they continue or are bothersome): -diarrhea or constipation -difficulty sleeping -nausea -tremors This list may not describe all possible side effects. Call your doctor for medical advice about side effects. You may report side effects to FDA at 1-800-FDA-1088. Where should I keep my medicine? Keep out of reach of children. Store at room temperature between 15 and 30 degrees C (59 and 86 degrees F). Throw away any unused medicine after the expiration date. NOTE: This sheet is a summary. It may not cover all possible information. If you have questions about this medicine, talk to your doctor, pharmacist, or health care provider.  2018 Elsevier/Gold Standard (2015-09-18 09:29:40)

## 2017-05-03 NOTE — Progress Notes (Signed)
I have read the note, and I agree with the clinical assessment and plan.  Kendra [name],Kendra [name]   

## 2017-05-03 NOTE — Progress Notes (Signed)
PATIENT: Kendra [name] DOB: [birth year]  REASON FOR VISIT: follow up HISTORY FROM: patient  HISTORY OF PRESENT ILLNESS: Today 05/03/17 Kendra [name] is a 31 year old female with a history of resection of a frontal meningioma as well as seizures. She returns today for follow-up. Before her surgery in April she was switched to Lamictal and she could not tolerate Keppra. She states after her surgery she was discharged on Keppra and Lamictal. She reports that she was doing quite well. At the beginning in July  she has not had any seizures so she decided to stop her medications. She states August 27 she had 2 seizures. Her mom witnessed the seizures reports that she was convulsing in all extremities. Her second seizure she did bite her tongue and have loss of bladder. She was taken to the emergency room. She was evaluated by the ED and discharged on Keppra. However she is not restarted the Lamictal. She returns today for an evaluation.  HISTORY Kendra [name] is a 31 year old right-handed black female with a history of morbid obesity, and a frontal meningioma that has significantly enlarged over time. The patient is planning to have surgery in April 2018. The patient has seizures that are likely related to the meningioma. She is having daily headaches at this point since December 2017. The headaches have become more prominent, and are generally in the left occipital area, spread down into the left neck and shoulder, with pain down the left arm and left leg. The patient denies any increased pain with turning her head. She indicates that she had a recent seizure on 10/19/2016 associated with tongue biting and urinary incontinence that occurred at night while in bed. The patient is on Keppra taking 500 mg twice daily, but she is barely tolerating the medication secondary to drowsiness. She is not sure that she can go up on the dose and still function. The patient questions whether the left side is slightly weak or  not. The patient denies any significant gait imbalance, but no falls. She does not have any visual complaints. She comes to this office on an urgent basis today.  REVIEW OF SYSTEMS: Out of a complete 14 system review of symptoms, the patient complains only of the following symptoms, and all other reviewed systems are negative.  Seizure, memory loss  ALLERGIES: Allergies  Allergen Reactions  . Asa Buff (Mag [Buffered Aspirin] Anaphylaxis  . Erythromycin Anaphylaxis  . Iodine Anaphylaxis and Hives  . Peanut-Containing Drug Products Anaphylaxis and Hives    Pt is allergic to peanut oil  . Shellfish Allergy Anaphylaxis, Hives, Swelling and Other (See Comments)    Pt throat swells    HOME MEDICATIONS: Outpatient Medications Prior to Visit  Medication Sig Dispense Refill  . acetaminophen (TYLENOL) 500 MG tablet Take 1,000 mg by mouth every 6 (six) hours as needed (for pain/headaches.).    Marland Kitchen albuterol (PROVENTIL HFA;VENTOLIN HFA) 108 (90 BASE) MCG/ACT inhaler Inhale 2 puffs into the lungs every 6 (six) hours as needed for wheezing or shortness of breath.    . diphenhydrAMINE (BENADRYL) 25 mg capsule Take 1 capsule (25 mg total) by mouth every 6 (six) hours as needed. 30 capsule 0  . EPINEPHrine 0.3 mg/0.3 mL IJ SOAJ injection Inject 0.3 mg into the muscle daily as needed (for anaphylatic allergic reactions).     . famotidine (PEPCID) 20 MG tablet     . gabapentin (NEURONTIN) 100 MG capsule Take 1 capsule (100 mg total) by mouth 3 (three) times  daily. (Patient taking differently: Take 100 mg by mouth 2 (two) times daily. ) 90 capsule 2  . lamoTRIgine (LAMICTAL) 100 MG tablet Take 1 tablet (100 mg total) by mouth 2 (two) times daily. 60 tablet 3  . levETIRAcetam (KEPPRA) 500 MG tablet Take 2 tablets (1,000 mg total) by mouth 2 (two) times daily. 60 tablet 1  . levonorgestrel (MIRENA) 20 MCG/24HR IUD 1 each by Intrauterine route once.    Marland Kitchen LINZESS 290 MCG CAPS capsule Take 290 mcg by mouth daily  as needed (constipation).   3  . ondansetron (ZOFRAN ODT) 4 MG disintegrating tablet Take 1 tablet (4 mg total) by mouth every 8 (eight) hours as needed. 10 tablet 0  . predniSONE (DELTASONE) 10 MG tablet     . HYDROcodone-acetaminophen (NORCO/VICODIN) 5-325 MG tablet Take 1 tablet by mouth every 4 (four) hours as needed. (Patient not taking: Reported on 12/17/2016) 10 tablet 0  . HYDROcodone-acetaminophen (NORCO/VICODIN) 5-325 MG tablet Take 1-2 tablets by mouth every 6 (six) hours as needed for moderate pain. (Patient not taking: Reported on 12/02/2016) 30 tablet 0  . HYDROcodone-acetaminophen (NORCO/VICODIN) 5-325 MG tablet Take 1 tablet by mouth every 4 (four) hours as needed for moderate pain. 30 tablet 0   No facility-administered medications prior to visit.     PAST MEDICAL HISTORY: Past Medical History:  Diagnosis Date  . Asthma   . Convulsions/seizures ([place]) 07/27/2016   most recent 04/25/17  . H/O: C-section   . Headache   . Lipoma   . Lipoma of LUQ abdomen, s/p removal 06/27/2012 05/24/2012  . Meningioma ([place]) 07/27/2016   Anterior fossa    PAST SURGICAL HISTORY: Past Surgical History:  Procedure Laterality Date  . APPLICATION OF CRANIAL NAVIGATION N/A 12/10/2016   Procedure: APPLICATION OF CRANIAL NAVIGATION;  Surgeon: Consuella Lose, MD;  Location: [place];  Service: Neurosurgery;  Laterality: N/A;  . BREAST SURGERY  12/30/11   breast reduction  . CESAREAN SECTION  09/09/2010   [place]  . CESAREAN SECTION N/A 01/13/2015   Procedure: CESAREAN SECTION;  Surgeon: Janyth Pupa, DO;  Location: [facility];  Service: Obstetrics;  Laterality: N/A;  EDD 01/18/15 would like [place] dressing  . CHOLECYSTECTOMY N/A 11/23/2016   Procedure: LAPAROSCOPIC CHOLECYSTECTOMY WITH INTRAOPERATIVE CHOLANGIOGRAM;  Surgeon: Donnie Mesa, MD;  Location: [facility];  Service: General;  Laterality: N/A;  . CRANIOTOMY N/A 12/10/2016   Procedure: BICORONAL CRANIOTOMY FOR RESECTION OF TUMOR;  Surgeon: Consuella Lose,  MD;  Location: [facility] [place];  Service: Neurosurgery;  Laterality: N/A;  BICORONAL CRANIOTOMY FOR RESECTION OF TUMOR   . LYMPH NODE DISSECTION  2013   Abdominal    FAMILY HISTORY: Family History  Problem Relation Age of Onset  . Kidney disease Mother        kidney stones  . Cancer Maternal Grandmother        breast  . Cancer Maternal Grandfather        Colon  . Deep vein thrombosis Maternal Grandfather        in throat  . Cancer Maternal Aunt        breast  . Heart disease Maternal Uncle   . Stroke Maternal Uncle   . Liver cancer Maternal Uncle   . Diabetes Paternal Grandfather     SOCIAL HISTORY: Social History   Social History  . Marital status: Single    Spouse name: N/A  . Number of children: 2  . Years of education: 12   Occupational History  . [employer]  Social History Main Topics  . Smoking status: Never Smoker  . Smokeless tobacco: Never Used  . Alcohol use No  . Drug use: No     Comment: pt denies  . Sexual activity: Yes    Partners: Male    Birth control/ protection: IUD   Other Topics Concern  . Not on file   Social History Narrative   Lives at home w/ her 2 children   Right-handed   Caffeine: couple of cups per month      PHYSICAL EXAM  Vitals:   05/03/17 0851  BP: 118/80  Pulse: 85  Weight: 231 lb 9.6 oz (105.1 kg)   Body mass index is 42.36 kg/m.  Generalized: Well developed, in no acute distress   Neurological examination  Mentation: Alert oriented to time, place, history taking. Follows all commands speech and language fluent Cranial nerve II-XII: Pupils were equal round reactive to light. Extraocular movements were full, visual field were full on confrontational test. Facial sensation and strength were normal. Uvula tongue midline. Head turning and shoulder shrug  were normal and symmetric. Motor: The motor testing reveals 5 over 5 strength of all 4 extremities. Good symmetric motor tone is noted throughout.  Sensory: Sensory testing is  intact to soft touch on all 4 extremities. No evidence of extinction is noted.  Coordination: Cerebellar testing reveals good finger-nose-finger and heel-to-shin bilaterally.  Gait and station: Gait is normal. Tandem gait is normal. Romberg is negative. No drift is seen.  Reflexes: Deep tendon reflexes are symmetric and normal bilaterally.   DIAGNOSTIC DATA (LABS, IMAGING, TESTING) - I reviewed patient records, labs, notes, testing and imaging myself where available.  Lab Results  Component Value Date   WBC 7.5 12/07/2016   HGB 12.2 12/10/2016   HCT 36.0 12/10/2016   MCV 91.1 12/07/2016   PLT 495 (H) 12/07/2016      Component Value Date/Time   NA 138 12/10/2016 1412   K 4.4 12/10/2016 1412   CL 105 12/07/2016 1226   CO2 26 12/07/2016 1226   GLUCOSE 96 12/07/2016 1226   BUN 8 12/07/2016 1226   CREATININE 0.73 12/07/2016 1226   CALCIUM 9.6 12/07/2016 1226   PROT 6.5 07/13/2016 0215   ALBUMIN 3.6 07/13/2016 0215   AST 16 07/13/2016 0215   ALT 12 (L) 07/13/2016 0215   ALKPHOS 70 07/13/2016 0215   BILITOT 0.4 07/13/2016 0215   GFRNONAA >60 12/07/2016 1226   GFRAA >60 12/07/2016 1226      ASSESSMENT AND PLAN 31 y.o. year old female  has a past medical history of Asthma; Convulsions/seizures ([place]) (07/27/2016); H/O: C-section; Headache; Lipoma; Lipoma of LUQ abdomen, s/p removal 06/27/2012 (05/24/2012); and Meningioma ([place]) (07/27/2016). here with:  1. Seizures 2. Meningioma resection  The patient had 2 additional seizures most likely that she stopped her medications abruptly in July. She is currently on Keppra 500 mg twice a day. In the past she has had breakthrough seizures on this dose. I will restart Lamictal. She'll begin taking 25 mg twice a day for 1 week then 50 mg twice a day for the second week and then 75 mg twice a day on the third week. After the end of the third week she will call our office and at that time we will increase her to 100 mg twice a day. She voiced  understanding. She will continue on Keppra. Patient is unable to drive, operate heavy machinery, perform activities at heights or participate in water activities until 6  months seizure free. She voiced understanding. When she is on Lamictal 100 mg twice a day will consider slowly decreasing her dose of Keppra. She voiced understanding. She will follow-up in 3 months or sooner if needed.   I spent 25 minutes with the patient. 50% of this time was spent answering her questions regarding her seizures and medication dosing.    Ward Givens, MSN, NP-C 05/03/2017, 8:59 AM [facility] [address], [place], [place] [address] [phone]

## 2017-05-05 ENCOUNTER — Telehealth: Payer: Self-pay | Admitting: Adult Health

## 2017-05-05 NOTE — Telephone Encounter (Signed)
[employer] on Ward Givens, NP's desk for review, signature.

## 2017-05-05 NOTE — Telephone Encounter (Signed)
Pt calling wanting to know if paperwork was received via fax that requires filling out.  Pt not sure if it was sent to the attention of NP Megan (since she just saw pt) or if it was sent to Dr Jannifer Franklin, she is needing it completed and sent to [employer].  Pt is requesting a call back to confirm

## 2017-05-09 DIAGNOSIS — D329 Benign neoplasm of meninges, unspecified: Secondary | ICD-10-CM | POA: Diagnosis not present

## 2017-05-09 DIAGNOSIS — Z0000 Encounter for general adult medical examination without abnormal findings: Secondary | ICD-10-CM | POA: Diagnosis not present

## 2017-05-09 DIAGNOSIS — K5909 Other constipation: Secondary | ICD-10-CM | POA: Diagnosis not present

## 2017-05-09 DIAGNOSIS — Z131 Encounter for screening for diabetes mellitus: Secondary | ICD-10-CM | POA: Diagnosis not present

## 2017-05-09 DIAGNOSIS — R569 Unspecified convulsions: Secondary | ICD-10-CM | POA: Diagnosis not present

## 2017-05-09 DIAGNOSIS — J45909 Unspecified asthma, uncomplicated: Secondary | ICD-10-CM | POA: Diagnosis not present

## 2017-05-09 DIAGNOSIS — Z13220 Encounter for screening for lipoid disorders: Secondary | ICD-10-CM | POA: Diagnosis not present

## 2017-05-09 DIAGNOSIS — Z23 Encounter for immunization: Secondary | ICD-10-CM | POA: Diagnosis not present

## 2017-05-10 NOTE — Telephone Encounter (Signed)
[employer] FML[employer] papers completed, signed, sent to medical records for processing. 

## 2017-05-11 NOTE — Telephone Encounter (Signed)
LM on VM to advise patient to call our office that her FMLA paperwork has been completed and a $50 fee is due for completing forms.

## 2017-05-12 DIAGNOSIS — Z0289 Encounter for other administrative examinations: Secondary | ICD-10-CM

## 2017-05-18 ENCOUNTER — Emergency Department (HOSPITAL_COMMUNITY)
Admission: EM | Admit: 2017-05-18 | Discharge: 2017-05-18 | Disposition: A | Discharge status: Home / Self Care | Payer: 59 | Attending: Emergency Medicine | Admitting: Emergency Medicine

## 2017-05-18 ENCOUNTER — Encounter (HOSPITAL_COMMUNITY): Payer: Self-pay | Admitting: Emergency Medicine

## 2017-05-18 DIAGNOSIS — R0602 Shortness of breath: Secondary | ICD-10-CM | POA: Diagnosis not present

## 2017-05-18 DIAGNOSIS — Z79899 Other long term (current) drug therapy: Secondary | ICD-10-CM | POA: Insufficient documentation

## 2017-05-18 DIAGNOSIS — R0789 Other chest pain: Secondary | ICD-10-CM | POA: Diagnosis present

## 2017-05-18 DIAGNOSIS — J45909 Unspecified asthma, uncomplicated: Secondary | ICD-10-CM | POA: Insufficient documentation

## 2017-05-18 DIAGNOSIS — T7840XA Allergy, unspecified, initial encounter: Secondary | ICD-10-CM | POA: Diagnosis not present

## 2017-05-18 MED ORDER — EPINEPHRINE 0.3 MG/0.3ML IJ SOAJ: 0.3000 mg | Freq: Once | INTRAMUSCULAR | 0 refills | Status: AC

## 2017-05-18 MED ORDER — METHYLPREDNISOLONE SODIUM SUCC 125 MG IJ SOLR
125.0000 mg | Freq: Once | INTRAMUSCULAR | Status: AC
  Administered 2017-05-18: 125 mg via INTRAVENOUS
  Filled 2017-05-18: qty 2

## 2017-05-18 MED ORDER — PREDNISONE 20 MG PO TABS: 60.0000 mg | ORAL_TABLET | Freq: Every day | ORAL | 0 refills | Status: AC

## 2017-05-18 MED ORDER — DIPHENHYDRAMINE HCL 50 MG/ML IJ SOLN
25.0000 mg | Freq: Once | INTRAMUSCULAR | Status: AC
  Administered 2017-05-18: 25 mg via INTRAVENOUS
  Filled 2017-05-18: qty 1

## 2017-05-18 NOTE — Discharge Instructions (Signed)
Please read and follow all provided instructions.  Your diagnoses today include:  1. Allergic reaction, initial encounter     Tests performed today include: Vital signs. See below for your results today.   Medications prescribed:  Take as prescribed   Home care instructions:  Follow any educational materials contained in this packet.  Follow-up instructions: Please follow-up with your primary care provider for further evaluation of symptoms and treatment   Return instructions:  Please return to the Emergency Department if you do not get better, if you get worse, or new symptoms OR  - Fever (temperature greater than 101.36F)  - Bleeding that does not stop with holding pressure to the area    -Severe pain (please note that you may be more sore the day after your accident)  - Chest Pain  - Difficulty breathing  - Severe nausea or vomiting  - Inability to tolerate food and liquids  - Passing out  - Skin becoming red around your wounds  - Change in mental status (confusion or lethargy)  - New numbness or weakness    Please return if you have any other emergent concerns.  Additional Information:  Your vital signs today were: BP 103/63    Pulse 85    Temp 98.5 F (36.9 C) (Oral)    Resp 18    Ht 5\' 2"  (1.575 m)    Wt 104.3 kg (230 lb)    SpO2 100%    BMI 42.07 kg/m  If your blood pressure (BP) was elevated above 135/85 this visit, please have this repeated by your doctor within one month. ---------------

## 2017-05-18 NOTE — ED Triage Notes (Signed)
Pt reports sudden onset of itching/chest tightness and SOB starting approx 6pm. Pt attempted to take benadryl X2 with no relief. Pt still was itching so she states she used her epi pen approx 1845. Swelling noted to pt face and R upper lip.   O2 sats 96% RA, lung sounds diminished.

## 2017-05-18 NOTE — ED Notes (Addendum)
PT states understanding of care given, follow up care, and medication prescribed. PT ambulated from ED to car with a steady gait. 

## 2017-05-18 NOTE — ED Provider Notes (Signed)
[facility] DEPT Provider Note   CSN: [id number] Arrival date & time: 05/18/17  1918     History   Chief Complaint Chief Complaint  Patient presents with  . Allergic Reaction    HPI Kendra [name] is a 31 y.o. female.  HPI  31 y.o. female with a hx of Asthma, Meningioma, presents to the Emergency Department today due to sudden onset itching/chest tightness with shortness of breath that occurred around 6pm. Pt unsure of exposure. Does not think she ate anything different. No insect bites. Does note taking headache medication for the past few days, but has taken in the past. Pt took Benadryl x 2 with minimal relief. Noted continued itching and used her Epi Pen around 1845. Notes swelling to face and right upper lip. No CP/SOB/ABD pain currently. No difficulty breathing. Speaking well. No fevers. No cough/congestion. No other symptoms noted.   Past Medical History:  Diagnosis Date  . Asthma   . Convulsions/seizures ([place]) 07/27/2016   most recent 04/25/17  . H/O: C-section   . Headache   . Lipoma   . Lipoma of LUQ abdomen, s/p removal 06/27/2012 05/24/2012  . Meningioma ([place]) 07/27/2016   Anterior fossa    Patient Active Problem List   Diagnosis Date Noted  . Convulsions/seizures ([place]) 07/27/2016  . Meningioma ([place]) 07/27/2016  . Common migraine with intractable migraine 07/27/2016  . Status post repeat low transverse cesarean section 01/14/2015  . Lipoma of LUQ abdomen, s/p removal 06/27/2012 05/24/2012  . 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 Chlamydia infection 12/13/2011  . 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 APPLICATION OF CRANIAL NAVIGATION N/A 12/10/2016   Procedure: APPLICATION OF CRANIAL NAVIGATION;  Surgeon: Consuella Lose, MD;  Location: [facility];  Service: Neurosurgery;  Laterality: N/A;  . BREAST SURGERY  12/30/11   breast reduction  . CESAREAN SECTION  09/09/2010   [place]  .  CESAREAN SECTION N/A 01/13/2015   Procedure: CESAREAN SECTION;  Surgeon: Janyth Pupa, DO;  Location: [facility];  Service: Obstetrics;  Laterality: N/A;  EDD 01/18/15 would like [place] dressing  . CHOLECYSTECTOMY N/A 11/23/2016   Procedure: LAPAROSCOPIC CHOLECYSTECTOMY WITH INTRAOPERATIVE CHOLANGIOGRAM;  Surgeon: Donnie Mesa, MD;  Location: [facility];  Service: General;  Laterality: N/A;  . CRANIOTOMY N/A 12/10/2016   Procedure: BICORONAL CRANIOTOMY FOR RESECTION OF TUMOR;  Surgeon: Consuella Lose, MD;  Location: [facility];  Service: Neurosurgery;  Laterality: N/A;  BICORONAL CRANIOTOMY FOR RESECTION OF TUMOR   . LYMPH NODE DISSECTION  2013   Abdominal    OB History    Gravida Para Term Preterm AB Living   2 2 2     2    SAB TAB Ectopic Multiple Live Births         0 1       Home Medications    Prior to Admission medications   Medication Sig Start Date End Date Taking? Authorizing Provider  acetaminophen (TYLENOL) 500 MG tablet Take 1,000 mg by mouth every 6 (six) hours as needed (for pain/headaches.).    [provider]  albuterol (PROVENTIL HFA;VENTOLIN HFA) 108 (90 BASE) MCG/ACT inhaler Inhale 2 puffs into the lungs every 6 (six) hours as needed for wheezing or shortness of breath.    [provider]  diphenhydrAMINE (BENADRYL) 25 mg capsule Take 1 capsule (25 mg total) by mouth every 6 (six) hours as needed. 07/17/16   Mackuen, Fredia Sorrow, MD  EPINEPHrine  0.3 mg/0.3 mL IJ SOAJ injection Inject 0.3 mg into the muscle daily as needed (for anaphylatic allergic reactions).     [provider]  famotidine (PEPCID) 20 MG tablet  11/05/16   [provider]  gabapentin (NEURONTIN) 100 MG capsule Take 1 capsule (100 mg total) by mouth 3 (three) times daily. Patient taking differently: Take 100 mg by mouth 2 (two) times daily.  10/21/16   Kathrynn Ducking, MD  lamoTRIgine (LAMICTAL) 25 MG tablet Take 1 tablet 25 mg BID for 1 week, then 2 tablets BID for 2 weeks, then  3 tablets BID for 2 weeks. 05/03/17   Ward Givens, NP  levETIRAcetam (KEPPRA) 500 MG tablet Take 2 tablets (1,000 mg total) by mouth 2 (two) times daily. 12/13/16   Consuella Lose, MD  levonorgestrel (MIRENA) 20 MCG/24HR IUD 1 each by Intrauterine route once.    [provider]  LINZESS 290 MCG CAPS capsule Take 290 mcg by mouth daily as needed (constipation).  07/16/16   [provider]  ondansetron (ZOFRAN ODT) 4 MG disintegrating tablet Take 1 tablet (4 mg total) by mouth every 8 (eight) hours as needed. 11/15/16   Isla Pence, MD  predniSONE (DELTASONE) 10 MG tablet  12/13/16   [provider]    Family History Family History  Problem Relation Age of Onset  . Kidney disease Mother        kidney stones  . Cancer Maternal Grandmother        breast  . Cancer Maternal Grandfather        Colon  . Deep vein thrombosis Maternal Grandfather        in throat  . Cancer Maternal Aunt        breast  . Heart disease Maternal Uncle   . Stroke Maternal Uncle   . Liver cancer Maternal Uncle   . Diabetes Paternal Grandfather     Social History Social History  Substance Use Topics  . Smoking status: Never Smoker  . Smokeless tobacco: Never Used  . Alcohol use No     Allergies   Asa buff (mag [buffered aspirin]; Erythromycin; Iodine; Peanut-containing drug products; and Shellfish allergy   Review of Systems Review of Systems ROS reviewed and all are negative for acute change except as noted in the HPI.  Physical Exam Updated Vital Signs BP (!) 127/101 (BP Location: Left Arm)   Pulse 85   Temp 98.5 F (36.9 C) (Oral)   Resp 20   Ht 5\' 2"  (1.575 m)   Wt 104.3 kg (230 lb)   SpO2 99%   BMI 42.07 kg/m   Physical Exam  Constitutional: She is oriented to person, place, and time. Vital signs are normal. She appears well-developed and well-nourished. No distress.  HENT:  Head: Normocephalic and atraumatic.  Right Ear: Hearing, tympanic membrane,  external ear and ear canal normal.  Left Ear: Hearing, tympanic membrane, external ear and ear canal normal.  Nose: Nose normal.  Mouth/Throat: Uvula is midline, oropharynx is clear and moist and mucous membranes are normal. No trismus in the jaw. No oropharyngeal exudate, posterior oropharyngeal erythema or tonsillar abscesses.  Right upper lip with swelling noted. Posterior oropharynx without edema. Phonating well. ROM of neck intact.   Eyes: Pupils are equal, round, and reactive to light. Conjunctivae and EOM are normal.  Neck: Normal range of motion. Neck supple. No tracheal deviation present.  Cardiovascular: Normal rate, regular rhythm, S1 normal, S2 normal, normal heart sounds, intact distal pulses  and normal pulses.   Pulmonary/Chest: Effort normal and breath sounds normal. No respiratory distress. She has no decreased breath sounds. She has no wheezes. She has no rhonchi. She has no rales.  Abdominal: Normal appearance and bowel sounds are normal. There is no tenderness.  Musculoskeletal: Normal range of motion.  Neurological: She is alert and oriented to person, place, and time.  Skin: Skin is warm and dry.  Diffuse urticarial rash noted. No erythema. No signs of infection. Swelling to bilateral hands and feet. NVI  Psychiatric: She has a normal mood and affect. Her speech is normal and behavior is normal. Thought content normal.  Nursing note and vitals reviewed.    ED Treatments / Results  Labs (all labs ordered are listed, but only abnormal results are displayed) Labs Reviewed - No data to display  EKG  EKG Interpretation None       Radiology No results found.  Procedures Procedures (including critical care time)  Medications Ordered in ED Medications  methylPREDNISolone sodium succinate (SOLU-MEDROL) 125 mg/2 mL injection 125 mg (125 mg Intravenous Given 05/18/17 2109)  diphenhydrAMINE (BENADRYL) injection 25 mg (25 mg Intravenous Given 05/18/17 2109)      Initial Impression / Assessment and Plan / ED Course  I have reviewed the triage vital signs and the nursing notes.  Pertinent labs & imaging results that were available during my care of the patient were reviewed by me and considered in my medical decision making (see chart for details).  Final Clinical Impressions(s) / ED Diagnoses     {I have reviewed the relevant previous healthcare records.  {I obtained HPI from historian.   ED Course:  Assessment: Pt is a 31 y.o. female with a hx of Asthma, Meningioma, presents to the Emergency Department today due to sudden onset itching/chest tightness with shortness of breath that occurred around 6pm. Pt unsure of exposure. Does not think she ate anything different. No insect bites. Does note taking headache medication for the past few days, but has taken in the past. Pt took Benadryl x 2 with minimal relief. Noted continued itching and used her Epi Pen around 1845. Notes swelling to face and right upper lip. No CP/SOB/ABD pain currently. No difficulty breathing. Speaking well. No fevers. No cough/congestion. On exam, pt in NAD. Nontoxic/nonseptic appearing. VSS. Afebrile. Lungs CTA. Heart RRR. Abdomen nontender soft. Right upper lip with swelling noted. Posterior oropharynx without edema. Phonating well. ROM of neck intact. Given solumedrol and benadryl in ED with improvement. Observed in ED without cahnge. Plan is to DC home with follow up to PCP. At time of discharge, Patient is in no acute distress. Vital Signs are stable. Patient is able to ambulate. Patient able to tolerate PO.    Disposition/Plan:  DC Home Additional Verbal discharge instructions given and discussed with patient.  Pt Instructed to f/u with PCP in the next week for evaluation and treatment of symptoms. Return precautions given Pt acknowledges and agrees with plan  Supervising Physician Orlie Dakin, MD  Final diagnoses:  Allergic reaction, initial encounter    New  Prescriptions New Prescriptions   No medications on file     Shary Decamp, Hershal Coria 05/18/17 [place], MD 05/19/17 [phone]

## 2017-05-19 MED FILL — predniSONE 20 MG TABS: 20 | 5 days supply | Qty: 15 | Fill #0

## 2017-05-19 MED FILL — EPINEPHRINE 0.3 MG AUTO-INJ: 0.3 | 30 days supply | Qty: 2 | Fill #0

## 2017-06-01 DIAGNOSIS — Z760 Encounter for issue of repeat prescription: Secondary | ICD-10-CM | POA: Diagnosis not present

## 2017-06-16 ENCOUNTER — Ambulatory Visit
Admission: RE | Admit: 2017-06-16 | Discharge: 2017-06-16 | Disposition: A | Discharge status: Home / Self Care | Payer: 59 | Source: Ambulatory Visit | Attending: Physician Assistant | Admitting: Physician Assistant

## 2017-06-16 ENCOUNTER — Other Ambulatory Visit: Payer: Self-pay | Admitting: Physician Assistant

## 2017-06-16 DIAGNOSIS — R52 Pain, unspecified: Secondary | ICD-10-CM

## 2017-06-16 DIAGNOSIS — Z6841 Body Mass Index (BMI) 40.0 and over, adult: Secondary | ICD-10-CM | POA: Diagnosis not present

## 2017-06-16 DIAGNOSIS — M25561 Pain in right knee: Secondary | ICD-10-CM | POA: Diagnosis not present

## 2017-06-16 DIAGNOSIS — M25569 Pain in unspecified knee: Secondary | ICD-10-CM | POA: Diagnosis not present

## 2017-06-16 DIAGNOSIS — M25562 Pain in left knee: Secondary | ICD-10-CM | POA: Diagnosis not present

## 2017-06-16 DIAGNOSIS — E669 Obesity, unspecified: Secondary | ICD-10-CM | POA: Diagnosis not present

## 2017-06-16 MED FILL — MELOXICAM 15 MG TABLET: 15 | 30 days supply | Qty: 30 | Fill #0

## 2017-07-14 DIAGNOSIS — J4540 Moderate persistent asthma, uncomplicated: Secondary | ICD-10-CM | POA: Diagnosis not present

## 2017-07-14 DIAGNOSIS — Z91018 Allergy to other foods: Secondary | ICD-10-CM | POA: Diagnosis not present

## 2017-07-14 DIAGNOSIS — K219 Gastro-esophageal reflux disease without esophagitis: Secondary | ICD-10-CM | POA: Diagnosis not present

## 2017-07-14 DIAGNOSIS — R635 Abnormal weight gain: Secondary | ICD-10-CM | POA: Diagnosis not present

## 2017-07-14 DIAGNOSIS — Z91013 Allergy to seafood: Secondary | ICD-10-CM | POA: Diagnosis not present

## 2017-07-14 DIAGNOSIS — J3089 Other allergic rhinitis: Secondary | ICD-10-CM | POA: Diagnosis not present

## 2017-07-14 DIAGNOSIS — K5904 Chronic idiopathic constipation: Secondary | ICD-10-CM | POA: Diagnosis not present

## 2017-07-14 DIAGNOSIS — R1031 Right lower quadrant pain: Secondary | ICD-10-CM | POA: Diagnosis not present

## 2017-07-14 MED FILL — SYMBICORT 80-4.5 MCG INH: 80-4.5 | 30 days supply | Qty: 10 | Fill #0

## 2017-07-18 MED FILL — LINZESS 290 MCG CAPSULE: 290 | 90 days supply | Qty: 90 | Fill #0

## 2017-07-18 MED FILL — OMEPRAZOLE DR 40 MG CAPSULE: 40 | 30 days supply | Qty: 30 | Fill #0

## 2017-07-26 MED FILL — VENTOLIN HFA 90 MCG INHALER: 108 (90 BAS | 17 days supply | Qty: 18 | Fill #0

## 2017-08-01 ENCOUNTER — Telehealth: Payer: Self-pay | Admitting: Neurology

## 2017-08-01 MED ORDER — GABAPENTIN 100 MG PO CAPS: 100.0000 mg | ORAL_CAPSULE | Freq: Three times a day (TID) | ORAL | 3 refills | Status: DC

## 2017-08-01 MED ORDER — LEVETIRACETAM 500 MG PO TABS: 1000.0000 mg | ORAL_TABLET | Freq: Two times a day (BID) | ORAL | 3 refills | Status: DC

## 2017-08-01 MED FILL — GABAPENTIN 100 MG CAP: 100 | 30 days supply | Qty: 90 | Fill #0

## 2017-08-01 MED FILL — levETIRAcetam 500 MG TABS: 500 | 15 days supply | Qty: 60 | Fill #0

## 2017-08-01 NOTE — Telephone Encounter (Signed)
Faxed printed/signed rx Keppra to [facility] at [phone]. Received fax confirmation.

## 2017-08-01 NOTE — Addendum Note (Signed)
Addended by: Hope Pigeon on: 08/01/2017 09:45 AM   Modules accepted: Orders

## 2017-08-01 NOTE — Telephone Encounter (Addendum)
Called patient back. Clarified which medications she would like refills on. Pt needs refills on gabapentin 100mg  capsule and keppra 500mg  tablet.  Updated her pharmacy to [facility].  She wanted to also keep [facility] on [address] as back up in case she needs rx after [place] hours.

## 2017-08-01 NOTE — Telephone Encounter (Signed)
Please call pt. Regarding medication. Pt. States that her medicine was supposed to be refilled and she would also like to change her pharmacy.

## 2017-08-03 ENCOUNTER — Ambulatory Visit: Payer: 59 | Admitting: Adult Health

## 2017-09-30 ENCOUNTER — Encounter (HOSPITAL_COMMUNITY): Payer: Self-pay | Admitting: Emergency Medicine

## 2017-09-30 ENCOUNTER — Ambulatory Visit (HOSPITAL_COMMUNITY)
Admission: EM | Admit: 2017-09-30 | Discharge: 2017-09-30 | Disposition: A | Discharge status: Home / Self Care | Payer: 59 | Attending: Internal Medicine | Admitting: Internal Medicine

## 2017-09-30 ENCOUNTER — Other Ambulatory Visit: Payer: Self-pay

## 2017-09-30 DIAGNOSIS — J069 Acute upper respiratory infection, unspecified: Secondary | ICD-10-CM | POA: Diagnosis not present

## 2017-09-30 DIAGNOSIS — B9789 Other viral agents as the cause of diseases classified elsewhere: Secondary | ICD-10-CM

## 2017-09-30 MED ORDER — PREDNISONE 20 MG PO TABS: 40.0000 mg | ORAL_TABLET | Freq: Every day | ORAL | 0 refills | Status: AC

## 2017-09-30 MED ORDER — IPRATROPIUM BROMIDE 0.06 % NA SOLN: 2.0000 | Freq: Four times a day (QID) | NASAL | 0 refills | Status: DC

## 2017-09-30 MED ORDER — BENZONATATE 100 MG PO CAPS: 100.0000 mg | ORAL_CAPSULE | Freq: Three times a day (TID) | ORAL | 0 refills | Status: DC

## 2017-09-30 MED ORDER — CETIRIZINE-PSEUDOEPHEDRINE ER 5-120 MG PO TB12: 1.0000 | ORAL_TABLET | Freq: Every day | ORAL | 0 refills | Status: DC

## 2017-09-30 NOTE — Discharge Instructions (Signed)
Prednisone as directed. Tessalon for cough. Continue flonase. Start atrovent nasal spray, zyrtec-D for nasal congestion/drainage. You can use over the counter nasal saline rinse such as neti pot for nasal congestion. Keep hydrated, your urine should be clear to pale yellow in color. Tylenol/motrin for fever and pain. Monitor for any worsening of symptoms, chest pain, shortness of breath, wheezing, swelling of the throat, follow up for reevaluation.   For sore throat try using a honey-based tea. Use 3 teaspoons of honey with juice squeezed from half lemon. Place shaved pieces of ginger into 1/2-1 cup of water and warm over stove top. Then mix the ingredients and repeat every 4 hours as needed.

## 2017-09-30 NOTE — ED Provider Notes (Signed)
Kendra [name]    CSN: [id number] Arrival date & time: 09/30/17  1643     History   Chief Complaint Chief Complaint  Patient presents with  . Cough    HPI Kendra [name] is a 32 y.o. female.   32 year old female comes in with one week history of URI symptoms. Mild productive cough, nasal congestion, rhinorrhea, frontal headache. Denies fever, chills, night sweats. Has been using albuterol inhaler more often with good relief. otc cold medications without relief. Positive sick contact. Never smoker.       Past Medical History:  Diagnosis Date  . Asthma   . Convulsions/seizures ([place]) 07/27/2016   most recent 04/25/17  . H/O: C-section   . Headache   . Lipoma   . Lipoma of LUQ abdomen, s/p removal 06/27/2012 05/24/2012  . Meningioma ([place]) 07/27/2016   Anterior fossa    Patient Active Problem List   Diagnosis Date Noted  . Convulsions/seizures ([place]) 07/27/2016  . Meningioma ([place]) 07/27/2016  . Common migraine with intractable migraine 07/27/2016  . Status post repeat low transverse cesarean section 01/14/2015  . Lipoma of LUQ abdomen, s/p removal 06/27/2012 05/24/2012  . 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 Chlamydia infection 12/13/2011  . 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 APPLICATION OF CRANIAL NAVIGATION N/A 12/10/2016   Procedure: APPLICATION OF CRANIAL NAVIGATION;  Surgeon: Consuella Lose, MD;  Location: [facility];  Service: Neurosurgery;  Laterality: N/A;  . BREAST SURGERY  12/30/11   breast reduction  . CESAREAN SECTION  09/09/2010   [place]  . CESAREAN SECTION N/A 01/13/2015   Procedure: CESAREAN SECTION;  Surgeon: Janyth Pupa, DO;  Location: [facility];  Service: Obstetrics;  Laterality: N/A;  EDD 01/18/15 would like [place] dressing  . CHOLECYSTECTOMY N/A 11/23/2016   Procedure: LAPAROSCOPIC CHOLECYSTECTOMY WITH INTRAOPERATIVE CHOLANGIOGRAM;  Surgeon: Donnie Mesa, MD;   Location: [facility];  Service: General;  Laterality: N/A;  . CRANIOTOMY N/A 12/10/2016   Procedure: BICORONAL CRANIOTOMY FOR RESECTION OF TUMOR;  Surgeon: Consuella Lose, MD;  Location: [facility];  Service: Neurosurgery;  Laterality: N/A;  BICORONAL CRANIOTOMY FOR RESECTION OF TUMOR   . LYMPH NODE DISSECTION  2013   Abdominal    OB History    Gravida Para Term Preterm AB Living   2 2 2     2    SAB TAB Ectopic Multiple Live Births         0 1       Home Medications    Prior to Admission medications   Medication Sig Start Date End Date Taking? Authorizing Provider  acetaminophen (TYLENOL) 500 MG tablet Take 1,000 mg by mouth every 6 (six) hours as needed (for pain/headaches.).    [provider]  albuterol (PROVENTIL HFA;VENTOLIN HFA) 108 (90 BASE) MCG/ACT inhaler Inhale 2 puffs into the lungs every 6 (six) hours as needed for wheezing or shortness of breath.    [provider]  benzonatate (TESSALON) 100 MG capsule Take 1 capsule (100 mg total) by mouth every 8 (eight) hours. 09/30/17   Tasia Catchings, Seraphina Mitchner V, PA-C  cetirizine-pseudoephedrine (ZYRTEC-D) 5-120 MG tablet Take 1 tablet by mouth daily. 09/30/17   Tasia Catchings, Vitor Overbaugh V, PA-C  diphenhydrAMINE (BENADRYL) 25 mg capsule Take 1 capsule (25 mg total) by mouth every 6 (six) hours as needed. 07/17/16   Mackuen, Courteney Lyn, MD  gabapentin (NEURONTIN) 100 MG capsule Take 1 capsule (100 mg total) by mouth  3 (three) times daily. 08/01/17   Ward Givens, NP  ipratropium (ATROVENT) 0.06 % nasal spray Place 2 sprays into both nostrils 4 (four) times daily. 09/30/17   Ok Edwards, PA-C  lamoTRIgine (LAMICTAL) 25 MG tablet Take 1 tablet 25 mg BID for 1 week, then 2 tablets BID for 2 weeks, then 3 tablets BID for 2 weeks. Patient taking differently: Take 50 mg by mouth 2 (two) times daily.  05/03/17   Ward Givens, NP  levETIRAcetam (KEPPRA) 500 MG tablet Take 2 tablets (1,000 mg total) by mouth 2 (two) times daily. 08/01/17   Ward Givens, NP    levonorgestrel (MIRENA) 20 MCG/24HR IUD 1 each by Intrauterine route once.    [provider]  LINZESS 290 MCG CAPS capsule Take 290 mcg by mouth daily as needed (constipation).  07/16/16   [provider]  ondansetron (ZOFRAN ODT) 4 MG disintegrating tablet Take 1 tablet (4 mg total) by mouth every 8 (eight) hours as needed. 11/15/16   Isla Pence, MD  predniSONE (DELTASONE) 20 MG tablet Take 2 tablets (40 mg total) by mouth daily for 4 days. 09/30/17 10/04/17  Ok Edwards, PA-C    Family History Family History  Problem Relation Age of Onset  . Kidney disease Mother        kidney stones  . Cancer Maternal Grandmother        breast  . Cancer Maternal Grandfather        Colon  . Deep vein thrombosis Maternal Grandfather        in throat  . Cancer Maternal Aunt        breast  . Heart disease Maternal Uncle   . Stroke Maternal Uncle   . Liver cancer Maternal Uncle   . Diabetes Paternal Grandfather     Social History Social History   Tobacco Use  . Smoking status: Never Smoker  . Smokeless tobacco: Never Used  Substance Use Topics  . Alcohol use: No  . Drug use: No    Comment: pt denies     Allergies   Asa buff (mag [buffered aspirin]; Erythromycin; Iodine; Peanut-containing drug products; and Shellfish allergy   Review of Systems Review of Systems  Reason unable to perform ROS: See HPI as above.     Physical Exam Triage Vital Signs ED Triage Vitals  Enc Vitals Group     BP 09/30/17 1721 124/73     Pulse Rate 09/30/17 1721 92     Resp 09/30/17 1721 18     Temp 09/30/17 1721 99.2 F (37.3 C)     Temp src --      SpO2 09/30/17 1721 99 %     Weight --      Height --      Head Circumference --      [place]ak Flow --      Pain Score 09/30/17 1722 9     Pain Loc --      Pain Edu? --      Excl. in [place]? --    No data found.  Updated Vital Signs BP 124/73   Pulse 92   Temp 99.2 F (37.3 C)   Resp 18   SpO2 99%   Physical Exam   Constitutional: She is oriented to person, place, and time. She appears well-developed and well-nourished. No distress.  HENT:  Head: Normocephalic and atraumatic.  Right Ear: Tympanic membrane, external ear and ear canal normal. Tympanic membrane is not erythematous and not  bulging.  Left Ear: Tympanic membrane, external ear and ear canal normal. Tympanic membrane is not erythematous and not bulging.  Nose: Mucosal edema and rhinorrhea present. Right sinus exhibits frontal sinus tenderness. Right sinus exhibits no maxillary sinus tenderness. Left sinus exhibits frontal sinus tenderness. Left sinus exhibits no maxillary sinus tenderness.  Mouth/Throat: Uvula is midline, oropharynx is clear and moist and mucous membranes are normal.  Eyes: Conjunctivae are normal. Pupils are equal, round, and reactive to light.  Neck: Normal range of motion. Neck supple.  Cardiovascular: Normal rate, regular rhythm and normal heart sounds. Exam reveals no gallop and no friction rub.  No murmur heard. Pulmonary/Chest: Effort normal and breath sounds normal. She has no decreased breath sounds. She has no wheezes. She has no rhonchi. She has no rales.  Lymphadenopathy:    She has no cervical adenopathy.  Neurological: She is alert and oriented to person, place, and time.  Skin: Skin is warm and dry.  Psychiatric: She has a normal mood and affect. Her behavior is normal. Judgment normal.     UC Treatments / Results  Labs (all labs ordered are listed, but only abnormal results are displayed) Labs Reviewed - No data to display  EKG  EKG Interpretation None       Radiology No results found.  Procedures Procedures (including critical care time)  Medications Ordered in UC Medications - No data to display   Initial Impression / Assessment and Plan / UC Course  I have reviewed the triage vital signs and the nursing notes.  Pertinent labs & imaging results that were available during my care of the  patient were reviewed by me and considered in my medical decision making (see chart for details).    Prednisone as directed for sinus pressure. Other symptomatic treatment discussed. Push fluids. Return precautions given. Patient expresses understanding and agrees to plan.   Final Clinical Impressions(s) / UC Diagnoses   Final diagnoses:  Viral URI with cough    ED Discharge Orders        Ordered    ipratropium (ATROVENT) 0.06 % nasal spray  4 times daily     09/30/17 1816    cetirizine-pseudoephedrine (ZYRTEC-D) 5-120 MG tablet  Daily     09/30/17 1816    benzonatate (TESSALON) 100 MG capsule  Every 8 hours     09/30/17 1816    predniSONE (DELTASONE) 20 MG tablet  Daily     09/30/17 1816        Ok Edwards, PA-C 09/30/17 1821

## 2017-09-30 NOTE — ED Triage Notes (Signed)
Pt c/o coughing x1 week, using her inhaler.

## 2017-10-04 ENCOUNTER — Emergency Department (HOSPITAL_COMMUNITY)
Admission: EM | Admit: 2017-10-04 | Discharge: 2017-10-05 | Disposition: A | Discharge status: Home / Self Care | Payer: 59 | Attending: Emergency Medicine | Admitting: Emergency Medicine

## 2017-10-04 ENCOUNTER — Encounter (HOSPITAL_COMMUNITY): Payer: Self-pay

## 2017-10-04 ENCOUNTER — Other Ambulatory Visit: Payer: Self-pay

## 2017-10-04 DIAGNOSIS — K920 Hematemesis: Secondary | ICD-10-CM

## 2017-10-04 DIAGNOSIS — K649 Unspecified hemorrhoids: Secondary | ICD-10-CM | POA: Diagnosis not present

## 2017-10-04 DIAGNOSIS — Z79899 Other long term (current) drug therapy: Secondary | ICD-10-CM | POA: Diagnosis not present

## 2017-10-04 DIAGNOSIS — R10816 Epigastric abdominal tenderness: Secondary | ICD-10-CM | POA: Insufficient documentation

## 2017-10-04 DIAGNOSIS — R110 Nausea: Secondary | ICD-10-CM | POA: Diagnosis not present

## 2017-10-04 DIAGNOSIS — J45909 Unspecified asthma, uncomplicated: Secondary | ICD-10-CM | POA: Insufficient documentation

## 2017-10-04 DIAGNOSIS — Z86011 Personal history of benign neoplasm of the brain: Secondary | ICD-10-CM | POA: Insufficient documentation

## 2017-10-04 DIAGNOSIS — N939 Abnormal uterine and vaginal bleeding, unspecified: Secondary | ICD-10-CM | POA: Insufficient documentation

## 2017-10-04 DIAGNOSIS — R10815 Periumbilic abdominal tenderness: Secondary | ICD-10-CM | POA: Diagnosis not present

## 2017-10-04 DIAGNOSIS — Z91013 Allergy to seafood: Secondary | ICD-10-CM | POA: Diagnosis not present

## 2017-10-04 DIAGNOSIS — R350 Frequency of micturition: Secondary | ICD-10-CM | POA: Diagnosis not present

## 2017-10-04 DIAGNOSIS — R1010 Upper abdominal pain, unspecified: Secondary | ICD-10-CM | POA: Insufficient documentation

## 2017-10-04 DIAGNOSIS — R195 Other fecal abnormalities: Secondary | ICD-10-CM | POA: Diagnosis not present

## 2017-10-04 LAB — URINALYSIS, ROUTINE W REFLEX MICROSCOPIC
BILIRUBIN URINE: NEGATIVE
Glucose, UA: NEGATIVE mg/dL
HGB URINE DIPSTICK: NEGATIVE
Ketones, ur: NEGATIVE mg/dL
Leukocytes, UA: NEGATIVE
Nitrite: NEGATIVE
PROTEIN: NEGATIVE mg/dL
Specific Gravity, Urine: 1.028 (ref 1.005–1.030)
pH: 6 (ref 5.0–8.0)

## 2017-10-04 LAB — COMPREHENSIVE METABOLIC PANEL
ALK PHOS: 92 U/L (ref 38–126)
ALT: 13 U/L — AB (ref 14–54)
AST: 14 U/L — ABNORMAL LOW (ref 15–41)
Albumin: 4.1 g/dL (ref 3.5–5.0)
Anion gap: 12 (ref 5–15)
BILIRUBIN TOTAL: 0.8 mg/dL (ref 0.3–1.2)
BUN: 11 mg/dL (ref 6–20)
CALCIUM: 9.1 mg/dL (ref 8.9–10.3)
CO2: 26 mmol/L (ref 22–32)
CREATININE: 0.68 mg/dL (ref 0.44–1.00)
Chloride: 100 mmol/L — ABNORMAL LOW (ref 101–111)
Glucose, Bld: 93 mg/dL (ref 65–99)
Potassium: 3.6 mmol/L (ref 3.5–5.1)
Sodium: 138 mmol/L (ref 135–145)
Total Protein: 7.6 g/dL (ref 6.5–8.1)

## 2017-10-04 LAB — CBC
HCT: 42.7 % (ref 36.0–46.0)
Hemoglobin: 14 g/dL (ref 12.0–15.0)
MCH: 30 pg (ref 26.0–34.0)
MCHC: 32.8 g/dL (ref 30.0–36.0)
MCV: 91.6 fL (ref 78.0–100.0)
PLATELETS: 507 10*3/uL — AB (ref 150–400)
RBC: 4.66 MIL/uL (ref 3.87–5.11)
RDW: 13.3 % (ref 11.5–15.5)
WBC: 11.4 10*3/uL — ABNORMAL HIGH (ref 4.0–10.5)

## 2017-10-04 LAB — WET PREP, GENITAL
Sperm: NONE SEEN
TRICH WET PREP: NONE SEEN
Yeast Wet Prep HPF POC: NONE SEEN

## 2017-10-04 LAB — LIPASE, BLOOD: Lipase: 27 U/L (ref 11–51)

## 2017-10-04 LAB — I-STAT BETA HCG BLOOD, ED (MC, WL, AP ONLY)

## 2017-10-04 MED ORDER — PANTOPRAZOLE SODIUM 20 MG PO TBEC
20.0000 mg | DELAYED_RELEASE_TABLET | Freq: Once | ORAL | Status: AC
  Administered 2017-10-05: 20 mg via ORAL
  Filled 2017-10-04: qty 1

## 2017-10-04 MED ORDER — ONDANSETRON 4 MG PO TBDP
4.0000 mg | ORAL_TABLET | Freq: Once | ORAL | Status: AC | PRN
  Administered 2017-10-04: 4 mg via ORAL
  Filled 2017-10-04: qty 1

## 2017-10-04 NOTE — ED Triage Notes (Signed)
Pt endorses having blood in her stools for the past week and had an episode of vomiting with blood this morning. Pt has an IUD in and states "I have been having vaginal bleeding for the past week and I have an IUD in" VSS.

## 2017-10-04 NOTE — ED Provider Notes (Signed)
[facility] EMERGENCY DEPARTMENT Provider Note   CSN: [id number] Arrival date & time: 10/04/17  1224     History   Chief Complaint Chief Complaint  Patient presents with  . Hematemesis  . Blood In Stools    HPI [place] is a 32 y.o. female w PMHx asthma, presenting to ED with multiple complaints. First complaint is 1 episode of hematemesis and hematochezia that occurred earlier today.  Patient states she began having abdominal discomfort that felt like a sensation of a bowel movement.  She states she then had a normal bowel movement, however which was streaked with bright red blood.  She states that she then felt nauseous and had an episode of emesis with some bright red blood in it.  She states she feels a burning pain in her upper abdomen now.  Reports she has hemorrhoids, and also hx of acid reflux. No hx of PUD. Pt's second complaint today is regarding vaginal bleeding that began on Sunday. Reports bleeding is like a period. Patient reports she has an IUD in place times 2-3 years, and was not having any periods throughout that time.  She states she has been having vaginal bleeding since Sunday, which is unusual for her.  Endorses some increased urinary frequency. She is currently sexually active w one female partner.    Denies F/C, appetite change, pelvic pain, vaginal discharge. Not on anticoagulation. The history is provided by the patient.    Past Medical History:  Diagnosis Date  . Asthma   . Convulsions/seizures ([place]) 07/27/2016   most recent 04/25/17  . H/O: C-section   . Headache   . Lipoma   . Lipoma of LUQ abdomen, s/p removal 06/27/2012 05/24/2012  . Meningioma ([place]) 07/27/2016   Anterior fossa    Patient Active Problem List   Diagnosis Date Noted  . Convulsions/seizures ([place]) 07/27/2016  . Meningioma ([place]) 07/27/2016  . Common migraine with intractable migraine 07/27/2016  . Status post repeat low transverse cesarean section 01/14/2015  .  Lipoma of LUQ abdomen, s/p removal 06/27/2012 05/24/2012  . 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 Chlamydia infection 12/13/2011  . 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 APPLICATION OF CRANIAL NAVIGATION N/A 12/10/2016   Procedure: APPLICATION OF CRANIAL NAVIGATION;  Surgeon: Consuella Lose, MD;  Location: [facility];  Service: Neurosurgery;  Laterality: N/A;  . BRAIN SURGERY    . BREAST SURGERY  12/30/11   breast reduction  . CESAREAN SECTION  09/09/2010   [place]  . CESAREAN SECTION N/A 01/13/2015   Procedure: CESAREAN SECTION;  Surgeon: Janyth Pupa, DO;  Location: [facility];  Service: Obstetrics;  Laterality: N/A;  EDD 01/18/15 would like [place] dressing  . CHOLECYSTECTOMY N/A 11/23/2016   Procedure: LAPAROSCOPIC CHOLECYSTECTOMY WITH INTRAOPERATIVE CHOLANGIOGRAM;  Surgeon: Donnie Mesa, MD;  Location: [facility];  Service: General;  Laterality: N/A;  . CRANIOTOMY N/A 12/10/2016   Procedure: BICORONAL CRANIOTOMY FOR RESECTION OF TUMOR;  Surgeon: Consuella Lose, MD;  Location: [facility];  Service: Neurosurgery;  Laterality: N/A;  BICORONAL CRANIOTOMY FOR RESECTION OF TUMOR   . LYMPH NODE DISSECTION  2013   Abdominal    OB History    Gravida Para Term Preterm AB Living   2 2 2     2    SAB TAB Ectopic Multiple Live Births         0 1       Home Medications  Prior to Admission medications   Medication Sig Start Date End Date Taking? Authorizing Provider  acetaminophen (TYLENOL) 500 MG tablet Take 1,000 mg by mouth every 6 (six) hours as needed (for pain/headaches.).    [provider]  albuterol (PROVENTIL HFA;VENTOLIN HFA) 108 (90 BASE) MCG/ACT inhaler Inhale 2 puffs into the lungs every 6 (six) hours as needed for wheezing or shortness of breath.    [provider]  benzonatate (TESSALON) 100 MG capsule Take 1 capsule (100 mg total) by mouth every 8 (eight) hours. 09/30/17   Tasia Catchings, Amy V, PA-C    cetirizine-pseudoephedrine (ZYRTEC-D) 5-120 MG tablet Take 1 tablet by mouth daily. 09/30/17   Tasia Catchings, Amy V, PA-C  diphenhydrAMINE (BENADRYL) 25 mg capsule Take 1 capsule (25 mg total) by mouth every 6 (six) hours as needed. 07/17/16   Mackuen, Courteney Lyn, MD  gabapentin (NEURONTIN) 100 MG capsule Take 1 capsule (100 mg total) by mouth 3 (three) times daily. 08/01/17   Ward Givens, NP  ipratropium (ATROVENT) 0.06 % nasal spray Place 2 sprays into both nostrils 4 (four) times daily. 09/30/17   Ok Edwards, PA-C  lamoTRIgine (LAMICTAL) 25 MG tablet Take 1 tablet 25 mg BID for 1 week, then 2 tablets BID for 2 weeks, then 3 tablets BID for 2 weeks. Patient taking differently: Take 50 mg by mouth 2 (two) times daily.  05/03/17   Ward Givens, NP  levETIRAcetam (KEPPRA) 500 MG tablet Take 2 tablets (1,000 mg total) by mouth 2 (two) times daily. 08/01/17   Ward Givens, NP  levonorgestrel (MIRENA) 20 MCG/24HR IUD 1 each by Intrauterine route once.    [provider]  LINZESS 290 MCG CAPS capsule Take 290 mcg by mouth daily as needed (constipation).  07/16/16   [provider]  ondansetron (ZOFRAN ODT) 4 MG disintegrating tablet Take 1 tablet (4 mg total) by mouth every 8 (eight) hours as needed. 11/15/16   Isla Pence, MD  pantoprazole (PROTONIX) 20 MG tablet Take 1 tablet (20 mg total) by mouth daily. 10/05/17   Jesstin Studstill, [place] N, PA-C    Family History Family History  Problem Relation Age of Onset  . Kidney disease Mother        kidney stones  . Cancer Maternal Grandmother        breast  . Cancer Maternal Grandfather        Colon  . Deep vein thrombosis Maternal Grandfather        in throat  . Cancer Maternal Aunt        breast  . Heart disease Maternal Uncle   . Stroke Maternal Uncle   . Liver cancer Maternal Uncle   . Diabetes Paternal Grandfather     Social History Social History   Tobacco Use  . Smoking status: Never Smoker  . Smokeless tobacco: Never Used   Substance Use Topics  . Alcohol use: No  . Drug use: No     Allergies   Asa buff (mag [buffered aspirin]; Erythromycin; Iodine; Peanut-containing drug products; and Shellfish allergy   Review of Systems Review of Systems  Constitutional: Negative for appetite change and fever.  Gastrointestinal: Positive for abdominal pain, blood in stool, nausea and vomiting. Negative for constipation and diarrhea.  Genitourinary: Positive for frequency and vaginal bleeding. Negative for dysuria, pelvic pain and vaginal discharge.  All other systems reviewed and are negative.    Physical Exam Updated Vital Signs BP (!) 125/98 (BP Location: Right Arm)   Pulse 69  Temp 98.1 F (36.7 C) (Oral)   Resp 18   Ht 5' (1.524 m)   Wt 104.3 kg (230 lb)   SpO2 100%   BMI 44.92 kg/m   Physical Exam  Constitutional: She appears well-developed and well-nourished. No distress.  HENT:  Head: Normocephalic and atraumatic.  Eyes: Conjunctivae are normal.  Cardiovascular: Normal rate, regular rhythm, normal heart sounds and intact distal pulses.  Pulmonary/Chest: Effort normal. No respiratory distress. She has wheezes (mild wheezes bilaterally).  Abdominal: Soft. Normal appearance and bowel sounds are normal. She exhibits no distension and no mass. There is tenderness in the right upper quadrant, epigastric area, periumbilical area and left upper quadrant. There is no rigidity, no rebound and no guarding.  Genitourinary: Uterus normal. Rectal exam shows external hemorrhoid (small, not thrombosed) and tenderness. There is no rash or tenderness on the right labia. There is no rash or tenderness on the left labia. Cervix exhibits motion tenderness. Cervix exhibits no discharge. Right adnexum displays no mass and no tenderness. Left adnexum displays no mass and no tenderness. No erythema in the vagina. No signs of injury around the vagina. No vaginal discharge found.  Genitourinary Comments: Exam performed with  chaperone present. IUD strings visualized at cervical os. No gross blood noted on rectal exam. Mild tenderness and possible small external hemorrhoid.  Neurological: She is alert.  Skin: Skin is warm.  Psychiatric: She has a normal mood and affect. Her behavior is normal.  Nursing note and vitals reviewed.    ED Treatments / Results  Labs (all labs ordered are listed, but only abnormal results are displayed) Labs Reviewed  WET PREP, GENITAL - Abnormal; Notable for the following components:      Result Value   Clue Cells Wet Prep HPF POC PRESENT (*)    WBC, Wet Prep HPF POC FEW (*)    All other components within normal limits  COMPREHENSIVE METABOLIC PANEL - Abnormal; Notable for the following components:   Chloride 100 (*)    AST 14 (*)    ALT 13 (*)    All other components within normal limits  CBC - Abnormal; Notable for the following components:   WBC 11.4 (*)    Platelets 507 (*)    All other components within normal limits  URINALYSIS, ROUTINE W REFLEX MICROSCOPIC - Abnormal; Notable for the following components:   APPearance HAZY (*)    All other components within normal limits  LIPASE, BLOOD  I-STAT BETA HCG BLOOD, ED ([facility], [facility], [facility] ONLY)  POC OCCULT BLOOD, ED  GC/CHLAMYDIA PROBE AMP ([place]) NOT AT [facility]    EKG  EKG Interpretation None       Radiology US Transvaginal Non-ob  Result Date: 10/05/2017 CLINICAL DATA:  Initial evaluation for acute pelvic pain. EXAM: TRANSABDOMINAL AND TRANSVAGINAL ULTRASOUND OF PELVIS DOPPLER ULTRASOUND OF OVARIES TECHNIQUE: Both transabdominal and transvaginal ultrasound examinations of the pelvis were performed. Transabdominal technique was performed for global imaging of the pelvis including uterus, ovaries, adnexal regions, and pelvic cul-de-sac. It was necessary to proceed with endovaginal exam following the transabdominal exam to visualize the uterus and ovaries. Color and duplex Doppler ultrasound was utilized to evaluate blood  flow to the ovaries. COMPARISON:  None available. FINDINGS: Uterus Measurements: 8.3 x 5.3 x 6.3 cm. No fibroids or other mass visualized. Endometrium Thickness: 6 mm. No focal abnormality visualized. IUD in appropriate position within the endometrial cavity. Right ovary Measurements: 4.5 x 2.2 x 3.5 cm. Normal appearance/no adnexal mass. Left  ovary Measurements: 3.8 x 1.8 x 3.4 cm. Normal appearance/no adnexal mass. Pulsed Doppler evaluation of both ovaries demonstrates normal low-resistance arterial and venous waveforms. Other findings Trace free physiologic fluid within the pelvis. IMPRESSION: 1. Normal pelvic ultrasound. No acute abnormality identified. No evidence for torsion. 2. IUD in appropriate position within the endometrial cavity. Electronically Signed   By: Jeannine Boga M.D.   On: 10/05/2017 01:43   US Pelvis Complete  Result Date: 10/05/2017 CLINICAL DATA:  Initial evaluation for acute pelvic pain. EXAM: TRANSABDOMINAL AND TRANSVAGINAL ULTRASOUND OF PELVIS DOPPLER ULTRASOUND OF OVARIES TECHNIQUE: Both transabdominal and transvaginal ultrasound examinations of the pelvis were performed. Transabdominal technique was performed for global imaging of the pelvis including uterus, ovaries, adnexal regions, and pelvic cul-de-sac. It was necessary to proceed with endovaginal exam following the transabdominal exam to visualize the uterus and ovaries. Color and duplex Doppler ultrasound was utilized to evaluate blood flow to the ovaries. COMPARISON:  None available. FINDINGS: Uterus Measurements: 8.3 x 5.3 x 6.3 cm. No fibroids or other mass visualized. Endometrium Thickness: 6 mm. No focal abnormality visualized. IUD in appropriate position within the endometrial cavity. Right ovary Measurements: 4.5 x 2.2 x 3.5 cm. Normal appearance/no adnexal mass. Left ovary Measurements: 3.8 x 1.8 x 3.4 cm. Normal appearance/no adnexal mass. Pulsed Doppler evaluation of both ovaries demonstrates normal  low-resistance arterial and venous waveforms. Other findings Trace free physiologic fluid within the pelvis. IMPRESSION: 1. Normal pelvic ultrasound. No acute abnormality identified. No evidence for torsion. 2. IUD in appropriate position within the endometrial cavity. Electronically Signed   By: Jeannine Boga M.D.   On: 10/05/2017 01:43   [place] Art/ven Flow Abd Pelv Doppler  Result Date: 10/05/2017 CLINICAL DATA:  Initial evaluation for acute pelvic pain. EXAM: TRANSABDOMINAL AND TRANSVAGINAL ULTRASOUND OF PELVIS DOPPLER ULTRASOUND OF OVARIES TECHNIQUE: Both transabdominal and transvaginal ultrasound examinations of the pelvis were performed. Transabdominal technique was performed for global imaging of the pelvis including uterus, ovaries, adnexal regions, and pelvic cul-de-sac. It was necessary to proceed with endovaginal exam following the transabdominal exam to visualize the uterus and ovaries. Color and duplex Doppler ultrasound was utilized to evaluate blood flow to the ovaries. COMPARISON:  None available. FINDINGS: Uterus Measurements: 8.3 x 5.3 x 6.3 cm. No fibroids or other mass visualized. Endometrium Thickness: 6 mm. No focal abnormality visualized. IUD in appropriate position within the endometrial cavity. Right ovary Measurements: 4.5 x 2.2 x 3.5 cm. Normal appearance/no adnexal mass. Left ovary Measurements: 3.8 x 1.8 x 3.4 cm. Normal appearance/no adnexal mass. Pulsed Doppler evaluation of both ovaries demonstrates normal low-resistance arterial and venous waveforms. Other findings Trace free physiologic fluid within the pelvis. IMPRESSION: 1. Normal pelvic ultrasound. No acute abnormality identified. No evidence for torsion. 2. IUD in appropriate position within the endometrial cavity. Electronically Signed   By: Jeannine Boga M.D.   On: 10/05/2017 01:43    Procedures Procedures (including critical care time)  Medications Ordered in ED Medications  ondansetron (ZOFRAN-ODT)  disintegrating tablet 4 mg (4 mg Oral Given 10/04/17 1415)  pantoprazole (PROTONIX) EC tablet 20 mg (20 mg Oral Given 10/05/17 0145)     Initial Impression / Assessment and Plan / ED Course  I have reviewed the triage vital signs and the nursing notes.  Pertinent labs & imaging results that were available during my care of the patient were reviewed by me and considered in my medical decision making (see chart for details).     Pt presenting with 1 episode  of hematemesis, mildly bloody stool, and vaginal bleeding/spotting since Sunday. Well-appearing, not in distress, VSS, afebrile. Abd exam without peritoneal signs or guarding. Pelvic exam w CMT, however no bleeding, no discharge noted, IUD string at cervical os, otherwise normal pelvic exam. Pelvic/transvaginal U/S normal, showing IUD in proper place. Rectal exam without gross blood, neg hemoccult. CBC w very mild leukocytosis. CMP, U/A, lipase and wet prep unremarkable. Pt discussed with Dr. Rees, plan to discharge w protonix for reported hematemesis and GI referral, f/u w GYN, and symptomatic management for hemorrhoid. Safe for discharge at this time.  Discussed results, findings, treatment and follow up. Patient advised of return precautions. Patient verbalized understanding and agreed with plan.  Final Clinical Impressions(s) / ED Diagnoses   Final diagnoses:  Vaginal bleeding  Acute hemorrhoid  Hematemesis, presence of nausea not specified    ED Discharge Orders        Ordered    pantoprazole (PROTONIX) 20 MG tablet  Daily     02 /06/19 0225       Sheza Strickland, [place] N, PA-C 10/05/17 [id number]    Quintella Reichert, MD 10/05/17 2253

## 2017-10-04 NOTE — ED Notes (Signed)
Occult blood card on pelvic cart.

## 2017-10-05 ENCOUNTER — Emergency Department (HOSPITAL_COMMUNITY): Payer: 59

## 2017-10-05 DIAGNOSIS — R10815 Periumbilic abdominal tenderness: Secondary | ICD-10-CM | POA: Diagnosis not present

## 2017-10-05 DIAGNOSIS — R110 Nausea: Secondary | ICD-10-CM | POA: Diagnosis not present

## 2017-10-05 DIAGNOSIS — N939 Abnormal uterine and vaginal bleeding, unspecified: Secondary | ICD-10-CM | POA: Diagnosis not present

## 2017-10-05 DIAGNOSIS — R102 Pelvic and perineal pain: Secondary | ICD-10-CM | POA: Diagnosis not present

## 2017-10-05 DIAGNOSIS — R350 Frequency of micturition: Secondary | ICD-10-CM | POA: Diagnosis not present

## 2017-10-05 DIAGNOSIS — R1010 Upper abdominal pain, unspecified: Secondary | ICD-10-CM | POA: Diagnosis not present

## 2017-10-05 DIAGNOSIS — J45909 Unspecified asthma, uncomplicated: Secondary | ICD-10-CM | POA: Diagnosis not present

## 2017-10-05 DIAGNOSIS — K649 Unspecified hemorrhoids: Secondary | ICD-10-CM | POA: Diagnosis not present

## 2017-10-05 DIAGNOSIS — R195 Other fecal abnormalities: Secondary | ICD-10-CM | POA: Diagnosis not present

## 2017-10-05 DIAGNOSIS — R10816 Epigastric abdominal tenderness: Secondary | ICD-10-CM | POA: Diagnosis not present

## 2017-10-05 LAB — GC/CHLAMYDIA PROBE AMP (~~LOC~~) NOT AT ARMC
Chlamydia: NEGATIVE
Neisseria Gonorrhea: NEGATIVE

## 2017-10-05 LAB — POC OCCULT BLOOD, ED: FECAL OCCULT BLD: NEGATIVE

## 2017-10-05 MED ORDER — PANTOPRAZOLE SODIUM 20 MG PO TBEC
20.0000 mg | DELAYED_RELEASE_TABLET | Freq: Every day | ORAL | 0 refills | Status: DC
Start: 2017-10-05 — End: 2020-09-05

## 2017-10-05 NOTE — Discharge Instructions (Signed)
Please read instructions below. Schedule an appointment with the gastroenterologist to further evaluate the blood in your vomit.  Begin taking the Protonix daily.  Avoid medications such as Advil/ibuprofen, Aleve, Goody's Powder, and foods such as spicy foods, acidic foods, and alcohol. Schedule an appointment with your gynecologist/PCP regarding your vaginal bleeding. Use a topical cream for your hemorrhoids, and you can do warm water soaks.  Return to the ER for worsening/persistent bloody vomit, severe abdominal pain, or new or concerning symptoms.

## 2017-10-05 NOTE — ED Notes (Signed)
Patient transported to Ultrasound 

## 2017-10-06 DIAGNOSIS — T781XXD Other adverse food reactions, not elsewhere classified, subsequent encounter: Secondary | ICD-10-CM | POA: Diagnosis not present

## 2017-10-06 MED FILL — PANTOPRAZOLE SOD DR 20 MG T: 20 | 14 days supply | Qty: 14 | Fill #0

## 2017-10-10 DIAGNOSIS — K625 Hemorrhage of anus and rectum: Secondary | ICD-10-CM | POA: Diagnosis not present

## 2017-10-10 DIAGNOSIS — K920 Hematemesis: Secondary | ICD-10-CM | POA: Diagnosis not present

## 2017-10-10 DIAGNOSIS — M549 Dorsalgia, unspecified: Secondary | ICD-10-CM | POA: Diagnosis not present

## 2017-10-10 MED FILL — METHYLPREDNISOLONE 4 MG TAB: 4 | 6 days supply | Qty: 21 | Fill #0

## 2017-10-11 ENCOUNTER — Encounter: Payer: Self-pay | Admitting: Adult Health

## 2017-10-17 ENCOUNTER — Emergency Department (HOSPITAL_COMMUNITY): Payer: 59

## 2017-10-17 ENCOUNTER — Emergency Department (HOSPITAL_COMMUNITY)
Admission: EM | Admit: 2017-10-17 | Discharge: 2017-10-17 | Disposition: A | Discharge status: Home / Self Care | Payer: 59 | Attending: Emergency Medicine | Admitting: Emergency Medicine

## 2017-10-17 ENCOUNTER — Other Ambulatory Visit: Payer: Self-pay

## 2017-10-17 ENCOUNTER — Encounter (HOSPITAL_COMMUNITY): Payer: Self-pay

## 2017-10-17 ENCOUNTER — Telehealth: Payer: Self-pay | Admitting: Adult Health

## 2017-10-17 DIAGNOSIS — G40909 Epilepsy, unspecified, not intractable, without status epilepticus: Secondary | ICD-10-CM | POA: Diagnosis not present

## 2017-10-17 DIAGNOSIS — J45909 Unspecified asthma, uncomplicated: Secondary | ICD-10-CM | POA: Diagnosis not present

## 2017-10-17 DIAGNOSIS — R569 Unspecified convulsions: Secondary | ICD-10-CM

## 2017-10-17 DIAGNOSIS — R51 Headache: Secondary | ICD-10-CM | POA: Diagnosis not present

## 2017-10-17 LAB — I-STAT BETA HCG BLOOD, ED (MC, WL, AP ONLY)

## 2017-10-17 LAB — I-STAT CHEM 8, ED
BUN: 14 mg/dL (ref 6–20)
CREATININE: 0.7 mg/dL (ref 0.44–1.00)
Calcium, Ion: 1.11 mmol/L — ABNORMAL LOW (ref 1.15–1.40)
Chloride: 103 mmol/L (ref 101–111)
GLUCOSE: 106 mg/dL — AB (ref 65–99)
HEMATOCRIT: 41 % (ref 36.0–46.0)
Hemoglobin: 13.9 g/dL (ref 12.0–15.0)
POTASSIUM: 3.7 mmol/L (ref 3.5–5.1)
Sodium: 139 mmol/L (ref 135–145)
TCO2: 23 mmol/L (ref 22–32)

## 2017-10-17 MED ORDER — KETOROLAC TROMETHAMINE 30 MG/ML IJ SOLN
30.0000 mg | Freq: Once | INTRAMUSCULAR | Status: AC
  Administered 2017-10-17: 30 mg via INTRAVENOUS
  Filled 2017-10-17: qty 1

## 2017-10-17 NOTE — Progress Notes (Addendum)
RRT team page to EVS .  On arrival staff report employee with seizure activity.  Staff reports she started to mumble - then fell unprotected face first to floor with jerking movements all extremites and foaming at the mouth.  On our arrival she is arousable - warm and dry -  sleepy - resps reg and unlabored - no incontintence noted - some salvia noted left shoulder.  Responds to voice - confused - tongue without evidence of injury.  Rolled to side without issues - then to back - and then sitting position - tol well - no complaints of pain.  No evidence of abrasion to forehead or face.  Stood to stretcher - no problems.  To ED - remains sleepy and confused.  Mother notified by [facility] - mom reports hx of seizures - long list allergies - no tongue swelling -resps reg and unlabored - Vital signs stable.  Handoff to [employer] who is talking with ED physician.  Patient becoming more alert - does not have memory of anything this AM.

## 2017-10-17 NOTE — ED Triage Notes (Signed)
Pt presents to the ed after having a seizure today at work lasting two minutes witnessed by coworkers.  Pt presents to the ed confused, denies any pain.  Per coworkers the patient was foaming at the mouth and fell face first.  Pt does not remember anything about this morning. Per mother the patient has a history of seizure and is on the way.

## 2017-10-17 NOTE — Telephone Encounter (Signed)
Pt calling to let us know that she had a seizure earlier today and went to the ER

## 2017-10-17 NOTE — ED Provider Notes (Signed)
Emergency Department Provider Note   I have reviewed the triage vital signs and the nursing notes.   HISTORY  Chief Complaint Seizures   HPI Kendra [name] is a 32 y.o. female with PMH of seizure disorder, meningioma s/p resection 12/10/2016 with Dr. Kathyrn Sheriff presents to the emergency department for evaluation of breakthrough seizure.  The patient was driving to work this morning when she began feeling somewhat off.  She admitted to the changing room here at the hospital and the next thing she knows she woke up in the emergency department.  The event was witnessed by staff who states that she had a seizure for approximately 2 minutes and was confused afterwards.  She reportedly fell forward striking her face.  The patient states she has been compliant with her Keppra and Lamictal with no recent dose changes.  She is followed by neurology as an outpatient.  She has had approximately 3 seizures since her surgery to remove the meningioma in April 2018.  She reports sleeping well, staying hydrated, and denies any other medication changes.  Denies any pain in the mouth/tongue.  No urine incontinence.  Breakup with a left-sided headache this AM.    Past Medical History:  Diagnosis Date  . Asthma   . Convulsions/seizures ([place]) 07/27/2016   most recent 04/25/17  . H/O: C-section   . Headache   . Lipoma   . Lipoma of LUQ abdomen, s/p removal 06/27/2012 05/24/2012  . Meningioma ([place]) 07/27/2016   Anterior fossa    Patient Active Problem List   Diagnosis Date Noted  . Convulsions/seizures ([place]) 07/27/2016  . Meningioma ([place]) 07/27/2016  . Common migraine with intractable migraine 07/27/2016  . Status post repeat low transverse cesarean section 01/14/2015  . Lipoma of LUQ abdomen, s/p removal 06/27/2012 05/24/2012  . 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 Chlamydia infection 12/13/2011  . 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 APPLICATION OF CRANIAL NAVIGATION N/A 12/10/2016   Procedure: APPLICATION OF CRANIAL NAVIGATION;  Surgeon: Consuella Lose, MD;  Location: [facility];  Service: Neurosurgery;  Laterality: N/A;  . BRAIN SURGERY    . BREAST SURGERY  12/30/11   breast reduction  . CESAREAN SECTION  09/09/2010   [place]  . CESAREAN SECTION N/A 01/13/2015   Procedure: CESAREAN SECTION;  Surgeon: Janyth Pupa, DO;  Location: [facility];  Service: Obstetrics;  Laterality: N/A;  EDD 01/18/15 would like [place] dressing  . CHOLECYSTECTOMY N/A 11/23/2016   Procedure: LAPAROSCOPIC CHOLECYSTECTOMY WITH INTRAOPERATIVE CHOLANGIOGRAM;  Surgeon: Donnie Mesa, MD;  Location: [facility];  Service: General;  Laterality: N/A;  . CRANIOTOMY N/A 12/10/2016   Procedure: BICORONAL CRANIOTOMY FOR RESECTION OF TUMOR;  Surgeon: Consuella Lose, MD;  Location: [facility];  Service: Neurosurgery;  Laterality: N/A;  BICORONAL CRANIOTOMY FOR RESECTION OF TUMOR   . LYMPH NODE DISSECTION  2013   Abdominal    Current Outpatient Rx  . Order #: [id number] Class: Historical Med  . Order #: [id number] Class: Historical Med  . Order #: [id number] Class: Normal  . Order #: [id number] Class: Normal  . Order #: [id number] Class: Print  . Order #: [id number] Class: Normal  . Order #: [id number] Class: Normal  . Order #: [id number] Class: Normal  . Order #: [id number] Class: Print  . Order #: [id number] Class: Historical Med  . Order #: [id number] Class: Historical Med  . Order #: [id number] Class: Print  . Order #: [id number] Class: Print    Allergies Asa buff (mag [buffered  aspirin]; Erythromycin; Iodine; Peanut-containing drug products; and Shellfish allergy  Family History  Problem Relation Age of Onset  . Kidney disease Mother        kidney stones  . Cancer Maternal Grandmother        breast  . Cancer Maternal Grandfather        Colon  . Deep vein thrombosis Maternal Grandfather        in throat  . Cancer Maternal Aunt        breast  . Heart  disease Maternal Uncle   . Stroke Maternal Uncle   . Liver cancer Maternal Uncle   . Diabetes Paternal Grandfather     Social History Social History   Tobacco Use  . Smoking status: Never Smoker  . Smokeless tobacco: Never Used  Substance Use Topics  . Alcohol use: No  . Drug use: No    Review of Systems  Constitutional: No fever/chills Eyes: No visual changes. ENT: No sore throat. Cardiovascular: Denies chest pain. Respiratory: Denies shortness of breath. Gastrointestinal: No abdominal pain.  No nausea, no vomiting.  No diarrhea.  No constipation. Genitourinary: Negative for dysuria. Musculoskeletal: Negative for back pain. Skin: Negative for rash. Neurological: Negative for focal weakness or numbness. Positive left sided HA and breakthrough seizure.   10-point ROS otherwise negative.  ____________________________________________   PHYSICAL EXAM:  VITAL SIGNS: ED Triage Vitals [10/17/17 0834]  Enc Vitals Group     BP 119/79     Pulse Rate 94     Resp 18     SpO2 93 %     Weight 230 lb (104.3 kg)   Constitutional: Alert and oriented. Well appearing and in no acute distress. Eyes: Conjunctivae are normal. EOMI Head: Atraumatic. Nose: No congestion/rhinnorhea. Mouth/Throat: Mucous membranes are moist.  Oropharynx non-erythematous. No tongue trauma.  Neck: No stridor.  Cardiovascular: Normal rate, regular rhythm. Good peripheral circulation. Grossly normal heart sounds.   Respiratory: Normal respiratory effort.  No retractions. Lungs CTAB. Gastrointestinal: Soft and nontender. No distention.  Musculoskeletal: No lower extremity tenderness nor edema. No gross deformities of extremities. Neurologic:  Normal speech and language. No gross focal neurologic deficits are appreciated. No pronator drift. Normal CN exam 2-12.  Skin:  Skin is warm, dry and intact. No rash noted.  ____________________________________________   LABS (all labs ordered are listed, but only  abnormal results are displayed)  Labs Reviewed  I-STAT CHEM 8, ED - Abnormal; Notable for the following components:      Result Value   Glucose, Bld 106 (*)    Calcium, Ion 1.11 (*)    All other components within normal limits  CBG MONITORING, ED  I-STAT BETA HCG BLOOD, ED ([facility], [facility], [facility] ONLY)   ____________________________________________  RADIOLOGY  Ct Head Wo Contrast  Result Date: 10/17/2017 CLINICAL DATA:  Seizure today. History of removal of a planum sphenoidale meningioma in 2018. EXAM: CT HEAD WITHOUT CONTRAST TECHNIQUE: Contiguous axial images were obtained from the base of the skull through the vertex without intravenous contrast. COMPARISON:  Head CT scan 12/03/2016.  Brain MRI 12/11/2016. FINDINGS: Brain: No evidence of acute abnormality including hemorrhage, infarct, mass lesion, mass effect, midline shift or abnormal extra-axial fluid collection. No hydrocephalus or pneumocephalus. Vascular: Negative. Skull: No fracture. The patient is status post frontal craniotomy for meningioma resection. Sinuses/Orbits: There is some mucosal thickening in the maxillary sinuses and scattered ethmoid air cells. Sphenoid sinus and left frontal sinus mucosal thickening also identified. Other: None. IMPRESSION: No acute abnormality.  Status post frontal craniotomy for resection of a meningioma. Sinus disease. Electronically Signed   By: Inge Rise M.D.   On: 10/17/2017 11:02    ____________________________________________   PROCEDURES  Procedure(s) performed:   Procedures  None ____________________________________________   INITIAL IMPRESSION / ASSESSMENT AND PLAN / ED COURSE  Pertinent labs & imaging results that were available during my care of the patient were reviewed by me and considered in my medical decision making (see chart for details).  Patient with past medical history of seizure and meningioma status post resection presents to the emergency department with headache  and breakthrough seizure.  The headache is left-sided.  No sudden onset, maximal intensity headache symptoms to suggest subarachnoid hemorrhage.  The patient has known seizure disorder.  No clear inciting event.  Cautioned the patient not to drive a vehicle.  She is awake, alert, and seems appropriate.  Given the sporadic nature of these of breakthrough seizures I plan for her to follow with her neurologist to have any additional medication adjustments.  Labs from triage including pregnancy reviewed and unremarkable.  Given her history of meningioma resection and headache plan for CT without contrast for evaluation of any significant cerebral edema or new large masses. Reports trying to schedule MRI with outpatient Neurology.   11:45 AM Patient headache improved.  CT scan shows no acute findings.  Discussed following with neurology today by phone to discuss any medication changes and schedule follow-up appointment.  Specifically discussed not driving with the patient until cleared by neurology to do so.  At this time, I do not feel there is any life-threatening condition present. I have reviewed and discussed all results (EKG, imaging, lab, urine as appropriate), exam findings with patient. I have reviewed nursing notes and appropriate previous records.  I feel the patient is safe to be discharged home without further emergent workup. Discussed usual and customary return precautions. Patient and family (if present) verbalize understanding and are comfortable with this plan.  Patient will follow-up with their primary care provider. If they do not have a primary care provider, information for follow-up has been provided to them. All questions have been answered.  ____________________________________________  FINAL CLINICAL IMPRESSION(S) / ED DIAGNOSES  Final diagnoses:  Seizure ([place])     MEDICATIONS GIVEN DURING THIS VISIT:  Medications  ketorolac (TORADOL) 30 MG/ML injection 30 mg (30 mg  Intravenous Given 10/17/17 1010)     Note:  This document was prepared using Dragon voice recognition software and may include unintentional dictation errors.  Nanda Quinton, MD Emergency Medicine    Long, W[facility], MD 10/17/17 [phone]

## 2017-10-17 NOTE — Telephone Encounter (Signed)
I spoke to pt and she will come in for appt on Thursday with MM.NP.  Taking keppra 1000mg  po bid and lamictal 50mg  po bid.  Had Sz in ED.

## 2017-10-17 NOTE — ED Notes (Signed)
Family at bedside. 

## 2017-10-17 NOTE — Discharge Instructions (Signed)

## 2017-10-17 NOTE — ED Notes (Signed)
CBG on I-Stat Chem 8 was 106.

## 2017-10-20 ENCOUNTER — Ambulatory Visit: Payer: 59 | Admitting: Adult Health

## 2017-10-20 ENCOUNTER — Encounter: Payer: Self-pay | Admitting: Adult Health

## 2017-10-20 VITALS — BP 134/91 | HR 79 | Wt 232.0 lb

## 2017-10-20 DIAGNOSIS — R569 Unspecified convulsions: Secondary | ICD-10-CM | POA: Diagnosis not present

## 2017-10-20 DIAGNOSIS — Z86011 Personal history of benign neoplasm of the brain: Secondary | ICD-10-CM

## 2017-10-20 MED ORDER — LAMOTRIGINE 25 MG PO TABS: ORAL_TABLET | ORAL | 1 refills | Status: DC

## 2017-10-20 MED FILL — lamoTRIgine 25 MG TABS: 25 | 30 days supply | Qty: 180 | Fill #0

## 2017-10-20 NOTE — Progress Notes (Signed)
I have read the note, and I agree with the clinical assessment and plan.  Kendra [name]   

## 2017-10-20 NOTE — Patient Instructions (Addendum)
Your Plan:  Continue keppra 1000mg  twice a day  lamictal titratration medication Instructions: First week: take 25mg  (1 tab) in the morning and take 25mg  (1 tab) in the evening Second week: take 50mg  (2 tab) in the morning and take 50mg  (2 tab) in the evening Third week: take 75mg  (3 tab) in the morning and take 75mg  (3 tab) in the evening Fourth week: call office and we will send in a script for 100mg  (1 tab) in the morning and 100mg  (1 tab) in the evening.  Do not drive, operate heavy machinery, perform activities at heights or participate in water activites until 6 months seizure free Please call if you have any side effects such as excessive lethargy or dizziness Follow up in 4 months or call earlier if needed  Thank you for coming to see [place] at [facility]. I hope we have been able to provide you high quality care today.  You may receive a patient satisfaction survey over the next few weeks. We would appreciate your feedback and comments so that we may continue to improve ourselves and the health of our patients.

## 2017-10-20 NOTE — Progress Notes (Signed)
PATIENT: Kendra [name] DOB: 09/10/1985  REASON FOR VISIT: follow up HISTORY FROM: patient  HISTORY OF PRESENT ILLNESS:   TODAY 10/20/17 Kendra [name] is a 32 year old African-American female with a history of seizures.  Patient had Recent hospital admission on 10/17/2017 after a breakthrough seizure.  At previous visit, patient was prescribed Lamictal on a titration schedule and should have been taking 100 mg twice daily at the end of the titration of 1 month.  Patient reported to taking 50 mg twice a day prior to seizure while she was in the hospital.The patient was driving to work when she began to feel somewhat off.  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  She then remembers waking up in the emergency department. event was witnessed by coworkers and the seizure lasted approximately 2 minutes and she had postictal confusion after.  Patient denies loss of bladder or bowel.  Patient did bite her tongue.  Patient States at the time of the seizure she was not getting enough sleep, stressed out, and had an illness.  Patient misunderstood titration directions of Lamictal and was taking 25 mg tablets 3 times a day.  Patient states this made her very drowsy where she was unable to function.  Patient decreased this back down to 25 mg twice a day.  Patient denies any seizure activity between last appointment and hospitalization.  Patient denies seizure activity from hospital discharge until today.  Patient returns for follow-up.  HISTORY 05/03/17: Kendra [name] is a 32 year old female with a history of resection of a frontal meningioma as well as seizures. She returns today for follow-up. Before her surgery in April she was switched to Lamictal and she could not tolerate Keppra. She states after her surgery she was discharged on Keppra and Lamictal. She reports that she was doing quite well. At the beginning in July  she has not had any seizures so she decided  to stop her medications. She states August 27 she had 2 seizures. Her mom witnessed the seizures reports that she was convulsing in all extremities. Her second seizure she did bite her tongue and have loss of bladder. She was taken to the emergency room. She was evaluated by the ED and discharged on Keppra. However she is not restarted the Lamictal. She returns today for an evaluation.  HISTORY Kendra [name] is a 32 year old right-handed black female with a history of morbid obesity, and a frontal meningioma that has significantly enlarged over time. The patient is planning to have surgery in April 2018. The patient has seizures that are likely related to the meningioma. She is having daily headaches at this point since December 2017. The headaches have become more prominent, and are generally in the left occipital area, spread down into the left neck and shoulder, with pain down the left arm and left leg. The patient denies any increased pain with turning her head. She indicates that she had a recent seizure on 10/19/2016 associated with tongue biting and urinary incontinence that occurred at night while in bed. The patient is on Keppra taking 500 mg twice daily, but she is barely tolerating the medication secondary to drowsiness. She is not sure that she can go up on the dose and still function. The patient questions whether the left side is slightly weak or not. The patient denies any significant gait imbalance, but no falls. She does not have any visual complaints. She comes to this  office on an urgent basis today.  REVIEW OF SYSTEMS: Out of a complete 14 system review of symptoms, the patient complains only of the following symptoms, and all other reviewed systems are negative.    ALLERGIES: Allergies  Allergen Reactions  . Asa Buff (Mag [Buffered Aspirin] Anaphylaxis  . Erythromycin Anaphylaxis  . Iodine Anaphylaxis and Hives  . Peanut-Containing Drug Products Anaphylaxis and Hives    Pt is  allergic to peanut oil  . Shellfish Allergy Anaphylaxis, Hives, Swelling and Other (See Comments)    Pt throat swells    HOME MEDICATIONS: Outpatient Medications Prior to Visit  Medication Sig Dispense Refill  . acetaminophen (TYLENOL) 500 MG tablet Take 1,000 mg by mouth every 6 (six) hours as needed (for pain/headaches.).    Marland Kitchen albuterol (PROVENTIL HFA;VENTOLIN HFA) 108 (90 BASE) MCG/ACT inhaler Inhale 2 puffs into the lungs every 6 (six) hours as needed for wheezing or shortness of breath.    . benzonatate (TESSALON) 100 MG capsule Take 1 capsule (100 mg total) by mouth every 8 (eight) hours. 21 capsule 0  . cetirizine-pseudoephedrine (ZYRTEC-D) 5-120 MG tablet Take 1 tablet by mouth daily. 15 tablet 0  . diphenhydrAMINE (BENADRYL) 25 mg capsule Take 1 capsule (25 mg total) by mouth every 6 (six) hours as needed. 30 capsule 0  . gabapentin (NEURONTIN) 100 MG capsule Take 1 capsule (100 mg total) by mouth 3 (three) times daily. 90 capsule 3  . ipratropium (ATROVENT) 0.06 % nasal spray Place 2 sprays into both nostrils 4 (four) times daily. 15 mL 0  . lamoTRIgine (LAMICTAL) 25 MG tablet Take 1 tablet 25 mg BID for 1 week, then 2 tablets BID for 2 weeks, then 3 tablets BID for 2 weeks. (Patient taking differently: Take 50 mg by mouth 2 (two) times daily. ) 180 tablet 5  . levETIRAcetam (KEPPRA) 500 MG tablet Take 2 tablets (1,000 mg total) by mouth 2 (two) times daily. 60 tablet 3  . levonorgestrel (MIRENA) 20 MCG/24HR IUD 1 each by Intrauterine route once.    Marland Kitchen LINZESS 290 MCG CAPS capsule Take 290 mcg by mouth daily as needed (constipation).   3  . ondansetron (ZOFRAN ODT) 4 MG disintegrating tablet Take 1 tablet (4 mg total) by mouth every 8 (eight) hours as needed. 10 tablet 0  . pantoprazole (PROTONIX) 20 MG tablet Take 1 tablet (20 mg total) by mouth daily. 14 tablet 0   No facility-administered medications prior to visit.     PAST MEDICAL HISTORY: Past Medical History:  Diagnosis  Date  . Asthma   . Convulsions/seizures ([place]) 07/27/2016   most recent 04/25/17  . H/O: C-section   . Headache   . Lipoma   . Lipoma of LUQ abdomen, s/p removal 06/27/2012 05/24/2012  . Meningioma ([place]) 07/27/2016   Anterior fossa    PAST SURGICAL HISTORY: Past Surgical History:  Procedure Laterality Date  . APPLICATION OF CRANIAL NAVIGATION N/A 12/10/2016   Procedure: APPLICATION OF CRANIAL NAVIGATION;  Surgeon: Consuella Lose, MD;  Location: [facility];  Service: Neurosurgery;  Laterality: N/A;  . BRAIN SURGERY    . BREAST SURGERY  12/30/11   breast reduction  . CESAREAN SECTION  09/09/2010   [place]  . CESAREAN SECTION N/A 01/13/2015   Procedure: CESAREAN SECTION;  Surgeon: Janyth Pupa, DO;  Location: [facility];  Service: Obstetrics;  Laterality: N/A;  EDD 01/18/15 would like [place]ico dressing  . CHOLECYSTECTOMY N/A 11/23/2016   Procedure: LAPAROSCOPIC CHOLECYSTECTOMY WITH INTRAOPERATIVE CHOLANGIOGRAM;  Surgeon: Rodman Key  Georgette Dover, MD;  Location: [facility];  Service: General;  Laterality: N/A;  . CRANIOTOMY N/A 12/10/2016   Procedure: BICORONAL CRANIOTOMY FOR RESECTION OF TUMOR;  Surgeon: Consuella Lose, MD;  Location: [facility];  Service: Neurosurgery;  Laterality: N/A;  BICORONAL CRANIOTOMY FOR RESECTION OF TUMOR   . LYMPH NODE DISSECTION  2013   Abdominal    FAMILY HISTORY: Family History  Problem Relation Age of Onset  . Kidney disease Mother        kidney stones  . Cancer Maternal Grandmother        breast  . Cancer Maternal Grandfather        Colon  . Deep vein thrombosis Maternal Grandfather        in throat  . Cancer Maternal Aunt        breast  . Heart disease Maternal Uncle   . Stroke Maternal Uncle   . Liver cancer Maternal Uncle   . Diabetes Paternal Grandfather     SOCIAL HISTORY: Social History   Socioeconomic History  . Marital status: Single    Spouse name: Not on file  . Number of children: 2  . Years of education: 75  . Highest education level: Not on file  Social  Needs  . Financial resource strain: Not on file  . Food insecurity - worry: Not on file  . Food insecurity - inability: Not on file  . Transportation needs - medical: Not on file  . Transportation needs - non-medical: Not on file  Occupational History  . Occupation: [employer]  Tobacco Use  . Smoking status: Never Smoker  . Smokeless tobacco: Never Used  Substance and Sexual Activity  . Alcohol use: No  . Drug use: No  . Sexual activity: Yes    Partners: Male    Birth control/protection: IUD  Other Topics Concern  . Not on file  Social History Narrative   Lives at home w/ her 2 children   Right-handed   Caffeine: couple of cups per month      PHYSICAL EXAM  Vitals:   10/20/17 1437  BP: (!) 134/91  Pulse: 79  Weight: 232 lb (105.2 kg)   Body mass index is 45.31 kg/m.  Generalized: Well developed, young African-American female, in no acute distress   Neurological examination  Mentation: Alert oriented to time, place, history taking. Follows all commands speech and language fluent Cranial nerve II-XII: Pupils were equal round reactive to light. Extraocular movements were full, visual field were full on confrontational test. Facial sensation and strength were normal. Uvula tongue midline. Head turning and shoulder shrug  were normal and symmetric. Motor: The motor testing reveals 5 over 5 strength of all 4 extremities. Good symmetric motor tone is noted throughout.  Sensory: Sensory testing is intact to soft touch on all 4 extremities. No evidence of extinction is noted.  Coordination: Cerebellar testing reveals good finger-nose-finger and heel-to-shin bilaterally.  Gait and station: Gait is normal. Tandem gait is normal. Romberg is negative. No drift is seen.  Reflexes: Deep tendon reflexes are symmetric and normal bilaterally.   DIAGNOSTIC DATA (LABS, IMAGING, TESTING) - I reviewed patient records, labs, notes, testing and imaging myself where available.  Lab Results    Component Value Date   WBC 11.4 (H) 10/04/2017   HGB 13.9 10/17/2017   HCT 41.0 10/17/2017   MCV 91.6 10/04/2017   PLT 507 (H) 10/04/2017      Component Value Date/Time   NA 139 10/17/2017 0914   K  3.7 10/17/2017 0914   CL 103 10/17/2017 0914   CO2 26 10/04/2017 1413   GLUCOSE 106 (H) 10/17/2017 0914   BUN 14 10/17/2017 0914   CREATININE 0.70 10/17/2017 0914   CALCIUM 9.1 10/04/2017 1413   PROT 7.6 10/04/2017 1413   ALBUMIN 4.1 10/04/2017 1413   AST 14 (L) 10/04/2017 1413   ALT 13 (L) 10/04/2017 1413   ALKPHOS 92 10/04/2017 1413   BILITOT 0.8 10/04/2017 1413   GFRNONAA >60 10/04/2017 1413   GFRAA >60 10/04/2017 1413      ASSESSMENT AND PLAN 32 y.o. year old female  has a past medical history of Asthma, Convulsions/seizures ([place]) (07/27/2016), H/O: C-section, Headache, Lipoma, Lipoma of LUQ abdomen, s/p removal 06/27/2012 (05/24/2012), and Meningioma ([place]) (07/27/2016). here with:  1. Seizures 2. Meningioma resection  The patient is currently taking her full dose of 1000 mg Keppra twice a day.  Patient was only taking Lamictal 25 mg twice a day.  She did not implement titration schedule as recommended at last visit.  Titration schedule started again and patient given full directions.  Patient advised to call with any questions regarding the schedule.  If patient tolerates the full dose of Lamictal, consider decreasing Keppra at future visits due to increase in drowsiness.  Patient was advised she was not allowed to drive, operate heavy machinery, perform activities of heights or participate in water activities until 6 months seizure-free per [employer].  Patient verbalized understanding.  Patient will follow-up in 4 months or will call sooner if needed. lamictal titratration schedule Instructions: First week: take 25mg  (1 tab) in the morning and take 25mg  (1 tab) in the evening Second week: take 50mg  (2 tab) in the morning and take 50mg  (2 tab) in the evening Third  week: take 75mg  (3 tab) in the morning and take 75mg  (3 tab) in the evening thereafter. Fourth week: call office and we will send in a script for 100mg  (1 tab) in the morning and 100mg  (1 tab) in the evening  I spent 25 minutes with the patient. 50% of this time was spent answering her questions regarding her seizures and medication dosing.  Venancio Poisson saw the patient. I agree with assessment and plan. I also spoke to patient and reviewed plan of care.   Ward Givens, MSN, NP-C 10/20/2017, 2:32 PM [facility] [address], [place], [place] [address] [phone]

## 2017-10-21 ENCOUNTER — Other Ambulatory Visit (HOSPITAL_COMMUNITY): Payer: Self-pay | Admitting: Neurosurgery

## 2017-10-21 DIAGNOSIS — D329 Benign neoplasm of meninges, unspecified: Secondary | ICD-10-CM

## 2017-10-24 MED FILL — METHYLPREDNISOLONE 4 MG TAB: 4 | 6 days supply | Qty: 21 | Fill #1

## 2017-10-27 ENCOUNTER — Telehealth: Payer: Self-pay | Admitting: *Deleted

## 2017-10-27 NOTE — Telephone Encounter (Signed)
I called pt unable to leave  a message, pt mailbox is full. Pt need to pay the fee for the [employer] form.

## 2017-11-03 ENCOUNTER — Ambulatory Visit: Payer: 59 | Admitting: Neurology

## 2017-11-07 ENCOUNTER — Telehealth: Payer: Self-pay | Admitting: Adult Health

## 2017-11-07 MED ORDER — LEVETIRACETAM 500 MG PO TABS: 1000.0000 mg | ORAL_TABLET | Freq: Two times a day (BID) | ORAL | 5 refills | Status: DC

## 2017-11-07 MED FILL — levETIRAcetam 500 MG TABS: 500 | 30 days supply | Qty: 120 | Fill #0

## 2017-11-07 NOTE — Telephone Encounter (Signed)
I called the patient.  She was recently started on Lamictal in the office visit in February.  I wanted to follow-up with the patient to see how she was doing on the Lamictal titration.  The patient's voicemail was full so I was unable to leave a message.

## 2017-11-07 NOTE — Addendum Note (Signed)
Addended by: Trudie Buckler on: 11/07/2017 01:30 PM   Modules accepted: Orders

## 2017-11-07 NOTE — Telephone Encounter (Signed)
The patient returned my call.  She states that she is currently taking Lamictal 2 tablets twice a day.  She will increase her dose on Thursday to 3 tablets twice a day.  She continues on Keppra 500 mg 2 tablets twice a day.  She states that she is out of Keppra.  I will send in a refill for this today.  She will call at the end of the week.  If she continues to tolerate Lamictal well.  We will call in the 100 mg strength for her.

## 2017-11-15 ENCOUNTER — Ambulatory Visit (HOSPITAL_COMMUNITY): Payer: 59

## 2017-11-15 ENCOUNTER — Ambulatory Visit (HOSPITAL_COMMUNITY)
Admission: RE | Admit: 2017-11-15 | Discharge: 2017-11-15 | Disposition: A | Discharge status: Home / Self Care | Payer: 59 | Source: Ambulatory Visit | Attending: Neurosurgery | Admitting: Neurosurgery

## 2017-11-15 DIAGNOSIS — Z86011 Personal history of benign neoplasm of the brain: Secondary | ICD-10-CM | POA: Diagnosis not present

## 2017-11-15 DIAGNOSIS — D329 Benign neoplasm of meninges, unspecified: Secondary | ICD-10-CM | POA: Diagnosis present

## 2017-11-15 DIAGNOSIS — Z9889 Other specified postprocedural states: Secondary | ICD-10-CM | POA: Insufficient documentation

## 2017-11-15 DIAGNOSIS — D320 Benign neoplasm of cerebral meninges: Secondary | ICD-10-CM | POA: Diagnosis not present

## 2017-11-15 MED ORDER — GADOBENATE DIMEGLUMINE 529 MG/ML IV SOLN
20.0000 mL | Freq: Once | INTRAVENOUS | Status: AC | PRN
  Administered 2017-11-15: 20 mL via INTRAVENOUS

## 2017-11-17 ENCOUNTER — Encounter: Payer: Self-pay | Admitting: Adult Health

## 2017-11-17 DIAGNOSIS — K5904 Chronic idiopathic constipation: Secondary | ICD-10-CM | POA: Diagnosis not present

## 2017-11-17 DIAGNOSIS — K219 Gastro-esophageal reflux disease without esophagitis: Secondary | ICD-10-CM | POA: Diagnosis not present

## 2017-11-18 ENCOUNTER — Other Ambulatory Visit: Payer: Self-pay | Admitting: *Deleted

## 2017-11-18 MED ORDER — LAMOTRIGINE 100 MG PO TABS: 100.0000 mg | ORAL_TABLET | Freq: Two times a day (BID) | ORAL | 1 refills | Status: DC

## 2017-11-18 MED FILL — lamoTRIgine 100 MG TABS: 100 | 90 days supply | Qty: 180 | Fill #0

## 2017-11-18 NOTE — Progress Notes (Signed)
Fax confirmation received [facility] outpt lamictal 100mg  po bid [phone].

## 2017-11-25 ENCOUNTER — Encounter: Payer: Self-pay | Admitting: Adult Health

## 2017-11-25 DIAGNOSIS — J4540 Moderate persistent asthma, uncomplicated: Secondary | ICD-10-CM | POA: Diagnosis not present

## 2017-11-25 DIAGNOSIS — Z91018 Allergy to other foods: Secondary | ICD-10-CM | POA: Diagnosis not present

## 2017-11-25 DIAGNOSIS — Z91013 Allergy to seafood: Secondary | ICD-10-CM | POA: Diagnosis not present

## 2017-11-25 DIAGNOSIS — J3089 Other allergic rhinitis: Secondary | ICD-10-CM | POA: Diagnosis not present

## 2017-11-27 ENCOUNTER — Emergency Department (HOSPITAL_COMMUNITY): Payer: No Typology Code available for payment source

## 2017-11-27 ENCOUNTER — Ambulatory Visit (HOSPITAL_COMMUNITY)
Admission: EM | Admit: 2017-11-27 | Discharge: 2017-11-27 | Disposition: A | Discharge status: Home / Self Care | Payer: 59

## 2017-11-27 ENCOUNTER — Encounter (HOSPITAL_COMMUNITY): Payer: Self-pay | Admitting: Emergency Medicine

## 2017-11-27 ENCOUNTER — Other Ambulatory Visit: Payer: Self-pay

## 2017-11-27 DIAGNOSIS — Z79899 Other long term (current) drug therapy: Secondary | ICD-10-CM | POA: Diagnosis not present

## 2017-11-27 DIAGNOSIS — S161XXA Strain of muscle, fascia and tendon at neck level, initial encounter: Secondary | ICD-10-CM | POA: Diagnosis not present

## 2017-11-27 DIAGNOSIS — S199XXA Unspecified injury of neck, initial encounter: Secondary | ICD-10-CM | POA: Diagnosis not present

## 2017-11-27 DIAGNOSIS — Y92481 Parking lot as the place of occurrence of the external cause: Secondary | ICD-10-CM | POA: Insufficient documentation

## 2017-11-27 DIAGNOSIS — S300XXA Contusion of lower back and pelvis, initial encounter: Secondary | ICD-10-CM | POA: Diagnosis not present

## 2017-11-27 DIAGNOSIS — S30811A Abrasion of abdominal wall, initial encounter: Secondary | ICD-10-CM | POA: Diagnosis not present

## 2017-11-27 DIAGNOSIS — S40012A Contusion of left shoulder, initial encounter: Secondary | ICD-10-CM | POA: Diagnosis not present

## 2017-11-27 DIAGNOSIS — Y999 Unspecified external cause status: Secondary | ICD-10-CM | POA: Insufficient documentation

## 2017-11-27 DIAGNOSIS — Z91010 Allergy to peanuts: Secondary | ICD-10-CM | POA: Insufficient documentation

## 2017-11-27 DIAGNOSIS — M545 Low back pain, unspecified: Secondary | ICD-10-CM

## 2017-11-27 DIAGNOSIS — Y9301 Activity, walking, marching and hiking: Secondary | ICD-10-CM | POA: Insufficient documentation

## 2017-11-27 DIAGNOSIS — R109 Unspecified abdominal pain: Secondary | ICD-10-CM | POA: Diagnosis not present

## 2017-11-27 DIAGNOSIS — J45909 Unspecified asthma, uncomplicated: Secondary | ICD-10-CM | POA: Diagnosis not present

## 2017-11-27 DIAGNOSIS — G40909 Epilepsy, unspecified, not intractable, without status epilepticus: Secondary | ICD-10-CM

## 2017-11-27 DIAGNOSIS — S4992XA Unspecified injury of left shoulder and upper arm, initial encounter: Secondary | ICD-10-CM | POA: Diagnosis not present

## 2017-11-27 DIAGNOSIS — S20222A Contusion of left back wall of thorax, initial encounter: Secondary | ICD-10-CM

## 2017-11-27 MED ORDER — OXYCODONE-ACETAMINOPHEN 5-325 MG PO TABS
1.0000 | ORAL_TABLET | ORAL | Status: DC | PRN
  Administered 2017-11-27: 1 via ORAL
  Filled 2017-11-27: qty 1
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
## 2017-11-28 ENCOUNTER — Emergency Department (HOSPITAL_COMMUNITY): Payer: No Typology Code available for payment source

## 2017-11-28 ENCOUNTER — Emergency Department (HOSPITAL_COMMUNITY)
Admission: EM | Admit: 2017-11-28 | Discharge: 2017-11-28 | Disposition: A | Discharge status: Home / Self Care | Payer: No Typology Code available for payment source | Attending: Emergency Medicine | Admitting: Emergency Medicine

## 2017-11-28 DIAGNOSIS — S299XXA Unspecified injury of thorax, initial encounter: Secondary | ICD-10-CM | POA: Diagnosis not present

## 2017-11-28 DIAGNOSIS — J45909 Unspecified asthma, uncomplicated: Secondary | ICD-10-CM | POA: Diagnosis not present

## 2017-11-28 DIAGNOSIS — S161XXA Strain of muscle, fascia and tendon at neck level, initial encounter: Secondary | ICD-10-CM | POA: Diagnosis not present

## 2017-11-28 DIAGNOSIS — S40012A Contusion of left shoulder, initial encounter: Secondary | ICD-10-CM | POA: Diagnosis not present

## 2017-11-28 DIAGNOSIS — Z79899 Other long term (current) drug therapy: Secondary | ICD-10-CM | POA: Diagnosis not present

## 2017-11-28 DIAGNOSIS — S5012XA Contusion of left forearm, initial encounter: Secondary | ICD-10-CM | POA: Diagnosis not present

## 2017-11-28 DIAGNOSIS — M545 Low back pain: Secondary | ICD-10-CM | POA: Diagnosis not present

## 2017-11-28 DIAGNOSIS — S3992XA Unspecified injury of lower back, initial encounter: Secondary | ICD-10-CM | POA: Diagnosis not present

## 2017-11-28 DIAGNOSIS — Z91010 Allergy to peanuts: Secondary | ICD-10-CM | POA: Diagnosis not present

## 2017-11-28 LAB — POC URINE PREG, ED: Preg Test, Ur: NEGATIVE
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
## 2017-11-28 NOTE — Discharge Instructions (Addendum)
Ibuprofen 600 mg every 6 hours as needed for pain.  Apply ice to the affected areas for 20 minutes of every 2 hours while awake for the next 2 days.  Follow-up with your primary doctor if symptoms are not improving in the next week.

## 2017-11-28 NOTE — ED Notes (Signed)
Updated on wait for treatment room. 

## 2017-11-29 DIAGNOSIS — R569 Unspecified convulsions: Secondary | ICD-10-CM | POA: Diagnosis not present

## 2017-12-01 ENCOUNTER — Encounter (INDEPENDENT_AMBULATORY_CARE_PROVIDER_SITE_OTHER): Payer: 59

## 2017-12-01 ENCOUNTER — Ambulatory Visit: Payer: 59 | Admitting: Adult Health

## 2017-12-13 MED FILL — levETIRAcetam 500 MG TABS: 500 | 30 days supply | Qty: 120 | Fill #1

## 2017-12-14 ENCOUNTER — Ambulatory Visit (INDEPENDENT_AMBULATORY_CARE_PROVIDER_SITE_OTHER): Payer: 59 | Admitting: Family Medicine

## 2017-12-28 DIAGNOSIS — D329 Benign neoplasm of meninges, unspecified: Secondary | ICD-10-CM | POA: Diagnosis not present

## 2017-12-28 DIAGNOSIS — Z6841 Body Mass Index (BMI) 40.0 and over, adult: Secondary | ICD-10-CM | POA: Diagnosis not present

## 2017-12-28 DIAGNOSIS — R030 Elevated blood-pressure reading, without diagnosis of hypertension: Secondary | ICD-10-CM | POA: Diagnosis not present

## 2018-01-11 ENCOUNTER — Ambulatory Visit (INDEPENDENT_AMBULATORY_CARE_PROVIDER_SITE_OTHER): Payer: 59 | Admitting: Family Medicine

## 2018-01-20 DIAGNOSIS — L439 Lichen planus, unspecified: Secondary | ICD-10-CM | POA: Diagnosis not present

## 2018-02-08 ENCOUNTER — Encounter (INDEPENDENT_AMBULATORY_CARE_PROVIDER_SITE_OTHER): Payer: Self-pay

## 2018-02-17 DIAGNOSIS — R109 Unspecified abdominal pain: Secondary | ICD-10-CM | POA: Diagnosis not present

## 2018-02-17 DIAGNOSIS — R112 Nausea with vomiting, unspecified: Secondary | ICD-10-CM | POA: Diagnosis not present

## 2018-02-17 DIAGNOSIS — R197 Diarrhea, unspecified: Secondary | ICD-10-CM | POA: Diagnosis not present

## 2018-02-17 MED FILL — OSCIMIN 0.125 MG TABLET: 0.125 | 7 days supply | Qty: 30 | Fill #0

## 2018-02-17 MED FILL — ONDANSETRON HCL 8 MG TABLET: 8 | 30 days supply | Qty: 20 | Fill #0

## 2018-02-20 MED FILL — levETIRAcetam 500 MG TABS: 500 | 30 days supply | Qty: 120 | Fill #2

## 2018-02-20 MED FILL — lamoTRIgine 100 MG TABS: 100 | 90 days supply | Qty: 180 | Fill #1

## 2018-03-01 ENCOUNTER — Ambulatory Visit: Payer: 59 | Admitting: Adult Health

## 2018-03-01 ENCOUNTER — Encounter: Payer: Self-pay | Admitting: Adult Health

## 2018-03-01 VITALS — BP 107/67 | HR 87 | Ht 61.0 in | Wt 235.0 lb

## 2018-03-01 DIAGNOSIS — R569 Unspecified convulsions: Secondary | ICD-10-CM

## 2018-03-01 MED ORDER — LEVETIRACETAM 750 MG PO TABS: 750.0000 mg | ORAL_TABLET | Freq: Two times a day (BID) | ORAL | 5 refills | Status: DC

## 2018-03-01 MED ORDER — LAMOTRIGINE 100 MG PO TABS: 150.0000 mg | ORAL_TABLET | Freq: Two times a day (BID) | ORAL | 3 refills | Status: DC

## 2018-03-01 NOTE — Progress Notes (Signed)
I have read the note, and I agree with the clinical assessment and plan.  Charles K Willis   

## 2018-03-01 NOTE — Progress Notes (Signed)
PATIENT: Kendra [name] DOB: 07/08/1986  REASON FOR VISIT: follow up HISTORY FROM: patient  HISTORY OF PRESENT ILLNESS: Today 03/01/18 Kendra [name] is a 32 year old female with a history of seizures.  She returns today for follow-up.  She is currently on Keppra taking 1000 mg twice a day as well as Lamictal 100 mg twice a day.  She denies any seizure events.  Denies any changes with her gait or balance.  She does note that since she began on Keppra she has had issues with anger outburst.  She denies any drowsiness with the medication.  She also reports that she lost her job several months ago.  This was not related to anger outburst.  She returns today for evaluation.   HISTORY 10/20/17 Kendra [name] is a 32 year old African-American female with a history of seizures.  Patient had Recent hospital admission on 10/17/2017 after a breakthrough seizure.  At previous visit, patient was prescribed Lamictal on a titration schedule and should have been taking 100 mg twice daily at the end of the titration of 1 month.  Patient reported to taking 50 mg twice a day prior to seizure while she was in the hospital.The patient was driving to work when she began to feel somewhat off.  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  She then remembers waking up in the emergency department. event was witnessed by coworkers and the seizure lasted approximately 2 minutes and she had postictal confusion after.  Patient denies loss of bladder or bowel.  Patient did bite her tongue.  Patient States at the time of the seizure she was not getting enough sleep, stressed out, and had an illness.  Patient misunderstood titration directions of Lamictal and was taking 25 mg tablets 3 times a day.  Patient states this made her very drowsy where she was unable to function.  Patient decreased this back down to 25 mg twice a day.  Patient denies any seizure activity between last appointment  and hospitalization.  Patient denies seizure activity from hospital discharge until today.  Patient returns for follow-up.  REVIEW OF SYSTEMS: Out of a complete 14 system review of symptoms, the patient complains only of the following symptoms, and all other reviewed systems are negative.  See HPI  ALLERGIES: Allergies  Allergen Reactions  . Asa Buff (Mag [Buffered Aspirin] Anaphylaxis  . Erythromycin Anaphylaxis  . Iodine Anaphylaxis and Hives  . Peanut-Containing Drug Products Anaphylaxis and Hives    Pt is allergic to peanut oil  . Shellfish Allergy Anaphylaxis, Hives, Swelling and Other (See Comments)    Pt throat swells  . Ibuprofen Rash    HOME MEDICATIONS: Outpatient Medications Prior to Visit  Medication Sig Dispense Refill  . acetaminophen (TYLENOL) 500 MG tablet Take 1,000 mg by mouth every 6 (six) hours as needed (for pain/headaches.).    Marland Kitchen albuterol (PROVENTIL HFA;VENTOLIN HFA) 108 (90 BASE) MCG/ACT inhaler Inhale 2 puffs into the lungs every 6 (six) hours as needed for wheezing or shortness of breath.    . benzonatate (TESSALON) 100 MG capsule Take 1 capsule (100 mg total) by mouth every 8 (eight) hours. 21 capsule 0  . cetirizine-pseudoephedrine (ZYRTEC-D) 5-120 MG tablet Take 1 tablet by mouth daily. 15 tablet 0  . diphenhydrAMINE (BENADRYL) 25 mg capsule Take 1 capsule (25 mg total) by mouth every 6 (six) hours as needed. 30 capsule 0  . gabapentin (NEURONTIN) 100 MG capsule Take  1 capsule (100 mg total) by mouth 3 (three) times daily. 90 capsule 3  . ipratropium (ATROVENT) 0.06 % nasal spray Place 2 sprays into both nostrils 4 (four) times daily. 15 mL 0  . lamoTRIgine (LAMICTAL) 100 MG tablet Take 1 tablet (100 mg total) by mouth 2 (two) times daily. 180 tablet 1  . levETIRAcetam (KEPPRA) 500 MG tablet Take 2 tablets (1,000 mg total) by mouth 2 (two) times daily. 120 tablet 5  . levonorgestrel (MIRENA) 20 MCG/24HR IUD 1 each by Intrauterine route once.    Marland Kitchen LINZESS  290 MCG CAPS capsule Take 290 mcg by mouth daily as needed (constipation).   3  . methylPREDNISolone (MEDROL DOSEPAK) 4 MG TBPK tablet See admin instructions.  1  . ondansetron (ZOFRAN ODT) 4 MG disintegrating tablet Take 1 tablet (4 mg total) by mouth every 8 (eight) hours as needed. 10 tablet 0  . pantoprazole (PROTONIX) 20 MG tablet Take 1 tablet (20 mg total) by mouth daily. 14 tablet 0   No facility-administered medications prior to visit.     PAST MEDICAL HISTORY: Past Medical History:  Diagnosis Date  . Asthma   . Convulsions/seizures ([place]) 07/27/2016   most recent 10/17/17  . H/O: C-section   . Headache   . Lipoma   . Lipoma of LUQ abdomen, s/p removal 06/27/2012 05/24/2012  . Meningioma ([place]) 07/27/2016   Anterior fossa    PAST SURGICAL HISTORY: Past Surgical History:  Procedure Laterality Date  . APPLICATION OF CRANIAL NAVIGATION N/A 12/10/2016   Procedure: APPLICATION OF CRANIAL NAVIGATION;  Surgeon: Consuella Lose, MD;  Location: [facility];  Service: Neurosurgery;  Laterality: N/A;  . BRAIN SURGERY    . BREAST SURGERY  12/30/11   breast reduction  . CESAREAN SECTION  09/09/2010   [place]  . CESAREAN SECTION N/A 01/13/2015   Procedure: CESAREAN SECTION;  Surgeon: Janyth Pupa, DO;  Location: [facility];  Service: Obstetrics;  Laterality: N/A;  EDD 01/18/15 would like [place] dressing  . CHOLECYSTECTOMY N/A 11/23/2016   Procedure: LAPAROSCOPIC CHOLECYSTECTOMY WITH INTRAOPERATIVE CHOLANGIOGRAM;  Surgeon: Donnie Mesa, MD;  Location: [facility];  Service: General;  Laterality: N/A;  . CRANIOTOMY N/A 12/10/2016   Procedure: BICORONAL CRANIOTOMY FOR RESECTION OF TUMOR;  Surgeon: Consuella Lose, MD;  Location: [facility];  Service: Neurosurgery;  Laterality: N/A;  BICORONAL CRANIOTOMY FOR RESECTION OF TUMOR   . LYMPH NODE DISSECTION  2013   Abdominal    FAMILY HISTORY: Family History  Problem Relation Age of Onset  . Kidney disease Mother        kidney stones  . Cancer Maternal Grandmother         breast  . Cancer Maternal Grandfather        Colon  . Deep vein thrombosis Maternal Grandfather        in throat  . Cancer Maternal Aunt        breast  . Heart disease Maternal Uncle   . Stroke Maternal Uncle   . Liver cancer Maternal Uncle   . Diabetes Paternal Grandfather     SOCIAL HISTORY: Social History   Socioeconomic History  . Marital status: Single    Spouse name: Not on file  . Number of children: 2  . Years of education: 72  . Highest education level: Not on file  Occupational History  . Occupation: [employer]  Social Needs  . Financial resource strain: Not on file  . Food insecurity:    Worry: Not on file  Inability: Not on file  . Transportation needs:    Medical: Not on file    Non-medical: Not on file  Tobacco Use  . Smoking status: Never Smoker  . Smokeless tobacco: Never Used  Substance and Sexual Activity  . Alcohol use: No  . Drug use: No  . Sexual activity: Yes    Partners: Male    Birth control/protection: IUD  Lifestyle  . Physical activity:    Days per week: Not on file    Minutes per session: Not on file  . Stress: Not on file  Relationships  . Social connections:    Talks on phone: Not on file    Gets together: Not on file    Attends religious service: Not on file    Active member of club or organization: Not on file    Attends meetings of clubs or organizations: Not on file    Relationship status: Not on file  . Intimate partner violence:    Fear of current or ex partner: Not on file    Emotionally abused: Not on file    Physically abused: Not on file    Forced sexual activity: Not on file  Other Topics Concern  . Not on file  Social History Narrative   Lives at home w/ her 2 children   Right-handed   Caffeine: couple of cups per month      PHYSICAL EXAM  Vitals:   03/01/18 1103  BP: 107/67  Pulse: 87  Weight: 235 lb (106.6 kg)  Height: 5\' 1"  (1.549 m)   Body mass index is 44.4 kg/m.  Generalized: Well  developed, in no acute distress   Neurological examination  Mentation: Alert oriented to time, place, history taking. Follows all commands speech and language fluent Cranial nerve II-XII: Pupils were equal round reactive to light. Extraocular movements were full, visual field were full on confrontational test. Facial sensation and strength were normal. Uvula tongue midline. Head turning and shoulder shrug  were normal and symmetric. Motor: The motor testing reveals 5 over 5 strength of all 4 extremities. Good symmetric motor tone is noted throughout.  Sensory: Sensory testing is intact to soft touch on all 4 extremities. No evidence of extinction is noted.  Coordination: Cerebellar testing reveals good finger-nose-finger and heel-to-shin bilaterally.  Gait and station: Gait is normal.  Reflexes: Deep tendon reflexes are symmetric and normal bilaterally.   DIAGNOSTIC DATA (LABS, IMAGING, TESTING) - I reviewed patient records, labs, notes, testing and imaging myself where available.  Lab Results  Component Value Date   WBC 11.4 (H) 10/04/2017   HGB 13.9 10/17/2017   HCT 41.0 10/17/2017   MCV 91.6 10/04/2017   PLT 507 (H) 10/04/2017      Component Value Date/Time   NA 139 10/17/2017 0914   K 3.7 10/17/2017 0914   CL 103 10/17/2017 0914   CO2 26 10/04/2017 1413   GLUCOSE 106 (H) 10/17/2017 0914   BUN 14 10/17/2017 0914   CREATININE 0.70 10/17/2017 0914   CALCIUM 9.1 10/04/2017 1413   PROT 7.6 10/04/2017 1413   ALBUMIN 4.1 10/04/2017 1413   AST 14 (L) 10/04/2017 1413   ALT 13 (L) 10/04/2017 1413   ALKPHOS 92 10/04/2017 1413   BILITOT 0.8 10/04/2017 1413   GFRNONAA >60 10/04/2017 1413   GFRAA >60 10/04/2017 1413      ASSESSMENT AND PLAN 32 y.o. year old female  has a past medical history of Asthma, Convulsions/seizures ([place]) (07/27/2016), H/O: C-section, Headache, Lipoma,  Lipoma of LUQ abdomen, s/p removal 06/27/2012 (05/24/2012), and Meningioma ([place]) (07/27/2016). here  with:  1.  Seizures  Patient has not had any seizure events.  She is having some side effects to Keppra.  We will slowly wean her off of Keppra.  She will decrease her dose to 750 mg twice a day.  I will increase Lamictal to 150 mg twice a day.  I did advise the patient that as we wean off of Keppra this may increase her risk of having an additional seizure event.  I did advise the patient that she cannot operate a motor vehicle until she is seizure-free for 6 months.  Patient voiced understanding.  She will follow-up in 6 months or sooner if needed.   I spent 15 minutes with the patient. 50% of this time was spent discussing medication adjustment   Ward Givens, MSN, NP-C 03/01/2018, 10:58 AM [facility] [address], [place], [place] [address] [phone]

## 2018-03-01 NOTE — Patient Instructions (Signed)
Your Plan:  Decrease Keppra to 750 mg twice a day. New prescription sent Increase Lamictal 150 mg twice a day. (1.5 tabs twice a day) If you have any seizure events please let us know If your symptoms worsen or you develop new symptoms please let us know.   Thank you for coming to see [place] at [facility]. I hope we have been able to provide you high quality care today.  You may receive a patient satisfaction survey over the next few weeks. We would appreciate your feedback and comments so that we may continue to improve ourselves and the health of our patients.

## 2018-03-04 LAB — COMPREHENSIVE METABOLIC PANEL
ALK PHOS: 93 IU/L (ref 39–117)
ALT: 11 IU/L (ref 0–32)
AST: 11 IU/L (ref 0–40)
Albumin/Globulin Ratio: 1.7 (ref 1.2–2.2)
Albumin: 4.3 g/dL (ref 3.5–5.5)
BUN/Creatinine Ratio: 11 (ref 9–23)
BUN: 8 mg/dL (ref 6–20)
Bilirubin Total: 0.3 mg/dL (ref 0.0–1.2)
CALCIUM: 9.5 mg/dL (ref 8.7–10.2)
CO2: 26 mmol/L (ref 20–29)
CREATININE: 0.71 mg/dL (ref 0.57–1.00)
Chloride: 103 mmol/L (ref 96–106)
GFR calc Af Amer: 133 mL/min/{1.73_m2} (ref >59)
GFR, EST NON AFRICAN AMERICAN: 115 mL/min/{1.73_m2} (ref >59)
GLUCOSE: 96 mg/dL (ref 65–99)
Globulin, Total: 2.5 g/dL (ref 1.5–4.5)
Potassium: 4.8 mmol/L (ref 3.5–5.2)
Sodium: 142 mmol/L (ref 134–144)
Total Protein: 6.8 g/dL (ref 6.0–8.5)

## 2018-03-04 LAB — CBC WITH DIFFERENTIAL/PLATELET
BASOS: 0 %
Basophils Absolute: 0 10*3/uL (ref 0.0–0.2)
EOS (ABSOLUTE): 0.1 10*3/uL (ref 0.0–0.4)
EOS: 1 %
HEMATOCRIT: 42.5 % (ref 34.0–46.6)
HEMOGLOBIN: 13.5 g/dL (ref 11.1–15.9)
IMMATURE GRANS (ABS): 0 10*3/uL (ref 0.0–0.1)
Immature Granulocytes: 0 %
LYMPHS: 38 %
Lymphocytes Absolute: 2.6 10*3/uL (ref 0.7–3.1)
MCH: 30 pg (ref 26.6–33.0)
MCHC: 31.8 g/dL (ref 31.5–35.7)
MCV: 94 fL (ref 79–97)
MONOS ABS: 0.4 10*3/uL (ref 0.1–0.9)
Monocytes: 5 %
NEUTROS PCT: 56 %
Neutrophils Absolute: 3.8 10*3/uL (ref 1.4–7.0)
Platelets: 436 10*3/uL (ref 150–450)
RBC: 4.5 x10E6/uL (ref 3.77–5.28)
RDW: 13.9 % (ref 12.3–15.4)
WBC: 6.9 10*3/uL (ref 3.4–10.8)

## 2018-03-04 LAB — LAMOTRIGINE LEVEL: Lamotrigine Lvl: 2.5 ug/mL (ref 2.0–20.0)

## 2018-03-06 ENCOUNTER — Encounter: Payer: Self-pay | Admitting: Adult Health

## 2018-05-02 ENCOUNTER — Other Ambulatory Visit: Payer: Self-pay | Admitting: Neurology

## 2018-05-18 IMAGING — MR MR HEAD WO/W CM
10 of 13 series · 32 of 48 positions shown · IV contrast (multihance)
Comparison: Prior MRI from 08/20/2016.

CLINICAL DATA: Initial evaluation for acute headache. History of
migraines, seizure, meningioma.

EXAM:
MRI HEAD WITHOUT AND WITH CONTRAST
TECHNIQUE: Multiplanar, multiecho pulse sequences of the brain and surrounding
structures were obtained without and with intravenous contrast.
CONTRAST:  20mL MULTIHANCE GADOBENATE DIMEGLUMINE 529 MG/ML IV SOLN

[Series 3: DWI · axial · 3.0mm · 0.94mm/px · z∈[-50,+84]mm · 7 of 92 slices shown (1 of 2)]
[im 1/92]
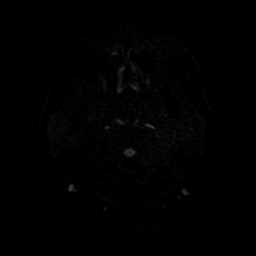
[im 16/92]
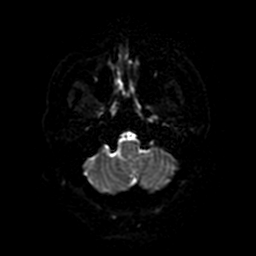
[im 31/92]
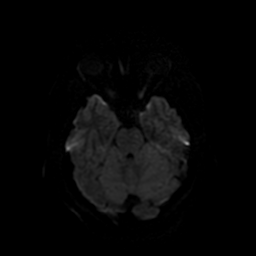
[im 46/92]
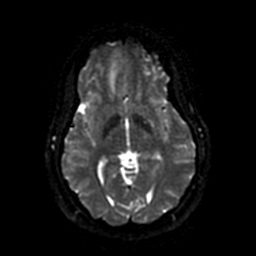
[im 61/92]
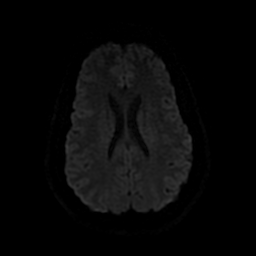
[im 76/92]
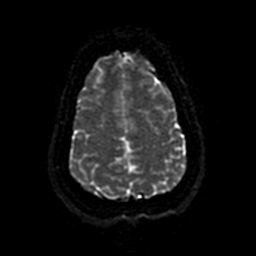
[im 92/92]
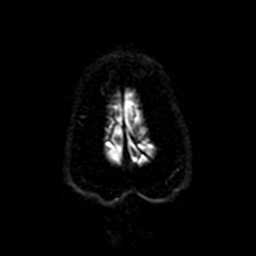

[Series 5: T2 · axial · 5.0mm · 0.47mm/px · z∈[-49,+82]mm · 2 of 23 slices shown]
[im 1/23]
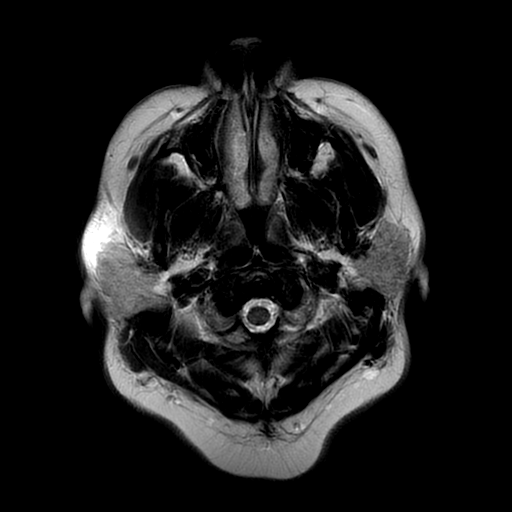
[im 23/23]
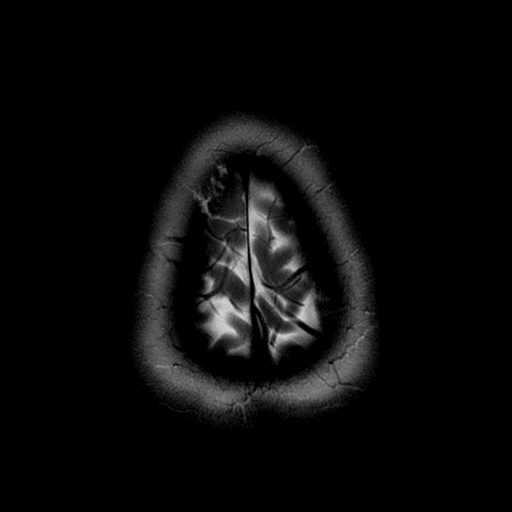

[Series 6: FLAIR · axial · 5.0mm · 0.47mm/px · z∈[-49,+82]mm · 2 of 23 slices shown (1 of 3)]
[im 1/23]
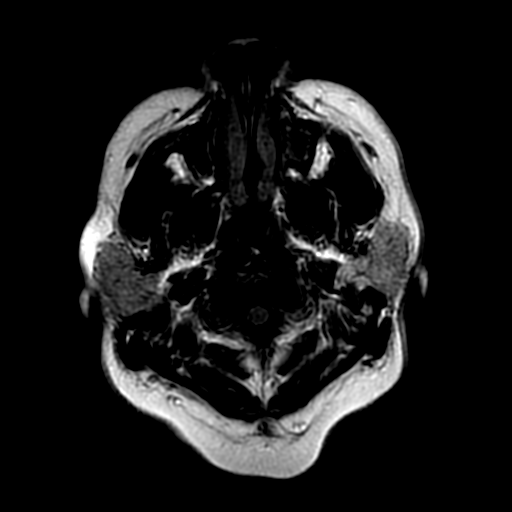
[im 23/23]
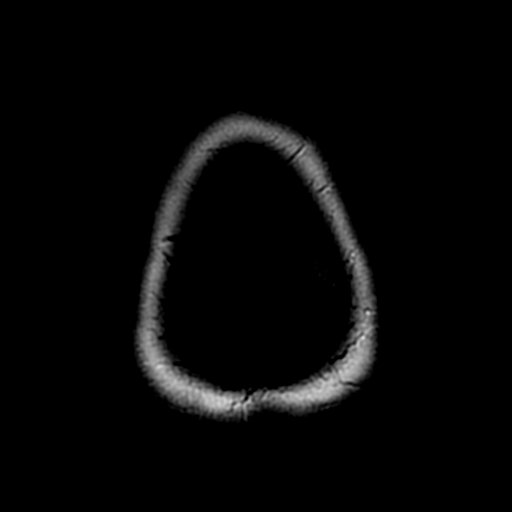

[Series 7: DWI · coronal · 4.0mm · 0.94mm/px · 6 of 70 slices shown (2 of 2)]
[im 1/70]
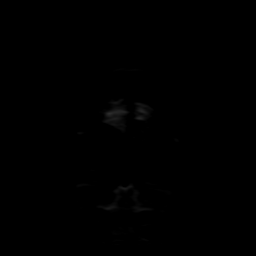
[im 14/70]
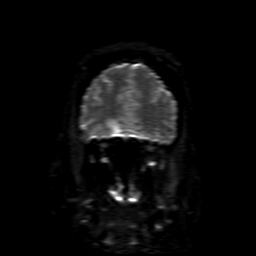
[im 28/70]
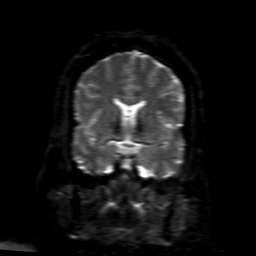
[im 42/70]
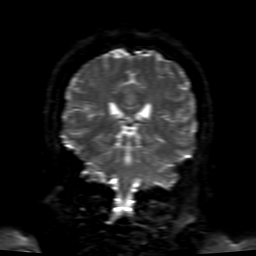
[im 56/70]
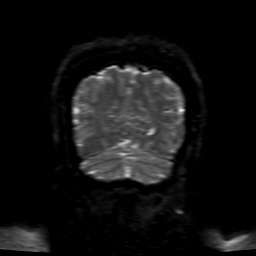
[im 70/70]
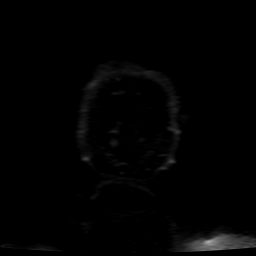

[Series 8: FLAIR · sagittal · 5.0mm · 0.47mm/px · 2 of 23 slices shown (2 of 3)]
[im 1/23]
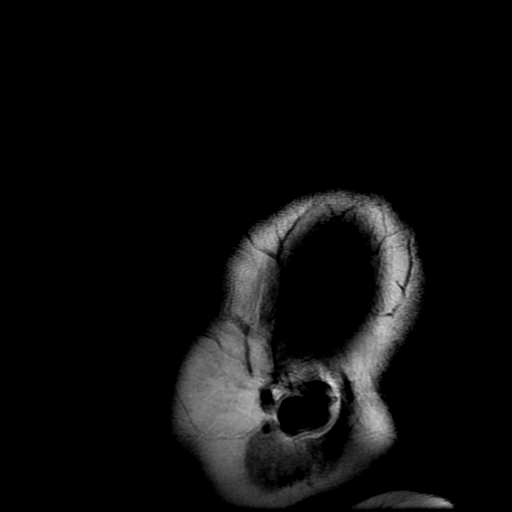
[im 23/23]
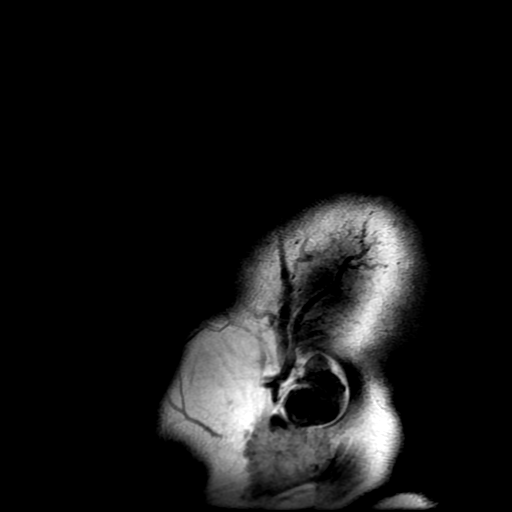

[Series 11: T2 post-contrast · coronal · 5.0mm · 0.39mm/px · 2 of 29 slices shown]
[im 1/29]
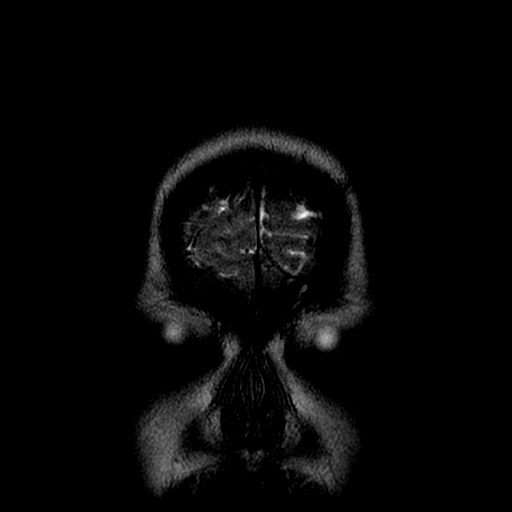
[im 29/29]
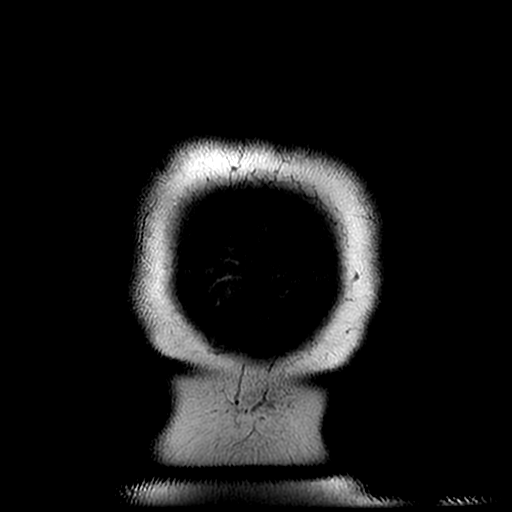

[Series 13: T1 · coronal · 5.0mm · 0.39mm/px · 2 of 29 slices shown]
[im 1/29]
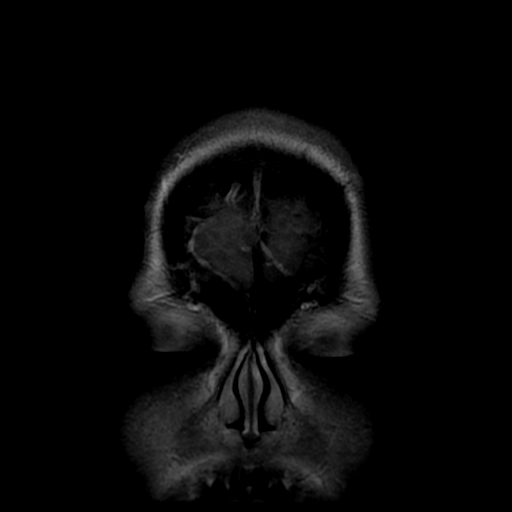
[im 29/29]
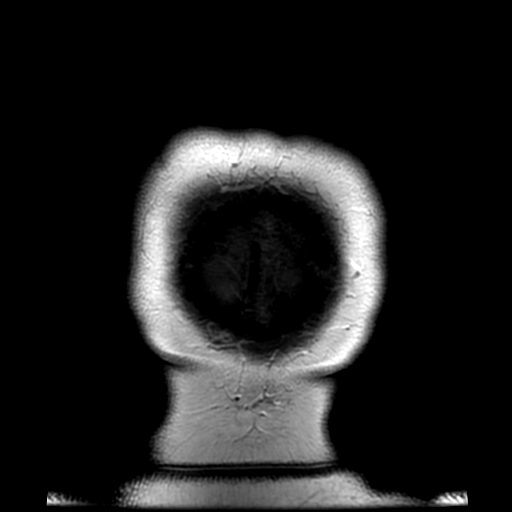

[Series 14: FLAIR · sagittal · 5.0mm · 0.47mm/px · 2 of 23 slices shown (3 of 3)]
[im 1/23]
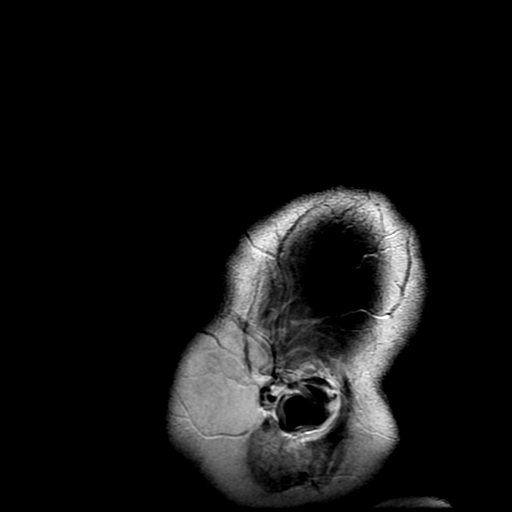
[im 23/23]
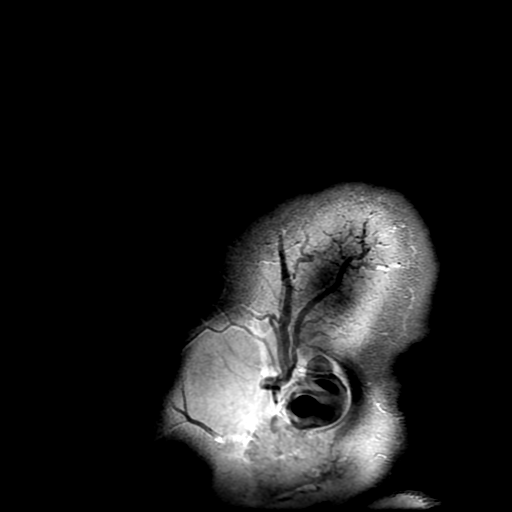

[Series 350: ADC · axial · 3.0mm · 0.94mm/px · z∈[-50,+84]mm · 4 of 45 slices shown (1 of 2)]
[im 1/45]
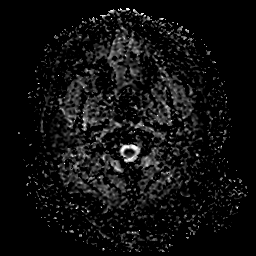
[im 15/45]
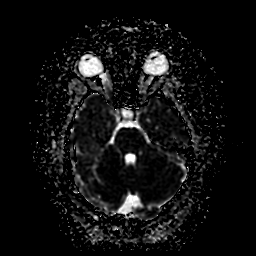
[im 30/45]
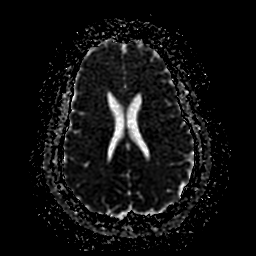
[im 45/45]
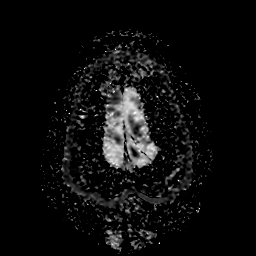

[Series 750: ADC · coronal · 4.0mm · 0.94mm/px · 3 of 35 slices shown (2 of 2)]
[im 1/35]
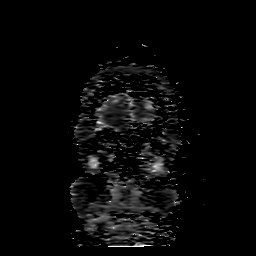
[im 18/35]
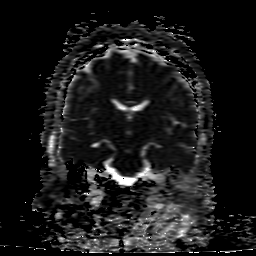
[im 35/35]
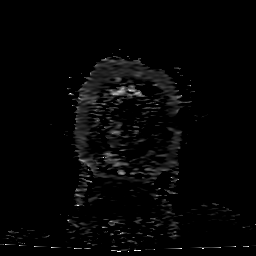

[32 of 48 positions shown; findings below may reference images not displayed]

FINDINGS: Brain: Cerebral volume within normal limits. No significant cerebral
white matter disease.

No abnormal foci of restricted diffusion to suggest acute or
subacute ischemia. Gray-white matter differentiation maintained. No
evidence for acute or chronic intracranial hemorrhage. No evidence
for chronic infarction.

The patient's known meningioma centered along the planum sphenoid
ale measures 3.5 x 2.4 x 2.1 cm (AP by transverse by craniocaudad).
Overall, this is not significantly changed from previous. Associated
mild edema within the inferior right frontal lobe, also similar.

No other mass lesion. No midline shift. No hydrocephalus. No
extra-axial fluid collection. Major dural sinuses are grossly
patent.

Incidental note made of an empty sella.

Vascular: Major intracranial vascular flow voids maintained.

Skull and upper cervical spine: Craniocervical junction within
normal limits. Visualized upper cervical spine unremarkable. Bone
marrow signal intensity within normal limits. Scalp soft tissues
unremarkable.

Sinuses/Orbits: Globes and orbital soft tissues within normal
limits. Scattered mucosal thickening within the ethmoidal air cells.
Paranasal sinuses are otherwise clear. No mastoid effusion. Inner
ear structures within normal limits.

Other: No other significant finding.
IMPRESSION: 1. No significant interval change in 3.5 x 2.4 x 2.1 cm meningioma
positioned along the planum sphenoid ale. Associated edema within
the anterior inferior right frontal lobe not significantly changed.
2. Otherwise negative brain MRI. No other acute intracranial process
identified.

## 2018-07-26 ENCOUNTER — Encounter: Payer: Self-pay | Admitting: Adult Health

## 2018-07-26 ENCOUNTER — Ambulatory Visit: Payer: 59 | Admitting: Adult Health

## 2018-07-26 VITALS — BP 108/72 | HR 89 | Ht 61.0 in | Wt 224.5 lb

## 2018-07-26 DIAGNOSIS — Z5181 Encounter for therapeutic drug level monitoring: Secondary | ICD-10-CM

## 2018-07-26 DIAGNOSIS — R569 Unspecified convulsions: Secondary | ICD-10-CM

## 2018-07-26 MED ORDER — LAMOTRIGINE 100 MG PO TABS: 150.0000 mg | ORAL_TABLET | Freq: Two times a day (BID) | ORAL | 5 refills | Status: DC

## 2018-07-26 NOTE — Addendum Note (Signed)
Addended by: Trudie Buckler on: 07/26/2018 09:46 AM   Modules accepted: Orders

## 2018-07-26 NOTE — Progress Notes (Signed)
I have read the note, and I agree with the clinical assessment and plan.  Charles K Willis   

## 2018-07-26 NOTE — Progress Notes (Signed)
PATIENT: Kendra [name] DOB: Apr 24, 1986  REASON FOR VISIT: follow up HISTORY FROM: patient  HISTORY OF PRESENT ILLNESS: Today 07/26/18:  Kendra [name] is a 32 year old female with a history of seizures.  She returns today for follow-up.  She is currently on Lamictal taking 150 mg twice a day.  She denies any seizure events.  Denies any changes in her gait or balance.  Reports that she is tolerating the medication well.  Denies any changes in her mood or behavior.  She was recently placed on Zoloft by her PCP.  She returns today for an evaluation.  HISTORY   03/01/18 Kendra [name] is a 32 year old female with a history of seizures.  She returns today for follow-up.  She is currently on Keppra taking 1000 mg twice a day as well as Lamictal 100 mg twice a day.  She denies any seizure events.  Denies any changes with her gait or balance.  She does note that since she began on Keppra she has had issues with anger outburst.  She denies any drowsiness with the medication.  She also reports that she lost her job several months ago.  This was not related to anger outburst.  She returns today for evaluation.  REVIEW OF SYSTEMS: Out of a complete 14 system review of symptoms, the patient complains only of the following symptoms, and all other reviewed systems are negative.  See HPI  ALLERGIES: Allergies  Allergen Reactions  . Asa Buff (Mag [Buffered Aspirin] Anaphylaxis  . Erythromycin Anaphylaxis  . Iodine Anaphylaxis and Hives  . Peanut-Containing Drug Products Anaphylaxis and Hives    Pt is allergic to peanut oil  . Shellfish Allergy Anaphylaxis, Hives, Swelling and Other (See Comments)    Pt throat swells  . Ibuprofen Rash    HOME MEDICATIONS: Outpatient Medications Prior to Visit  Medication Sig Dispense Refill  . acetaminophen (TYLENOL) 500 MG tablet Take 1,000 mg by mouth every 6 (six) hours as needed (for pain/headaches.).    Marland Kitchen albuterol (PROVENTIL HFA;VENTOLIN HFA) 108 (90 BASE)  MCG/ACT inhaler Inhale 2 puffs into the lungs every 6 (six) hours as needed for wheezing or shortness of breath.    . benzonatate (TESSALON) 100 MG capsule Take 1 capsule (100 mg total) by mouth every 8 (eight) hours. 21 capsule 0  . cetirizine-pseudoephedrine (ZYRTEC-D) 5-120 MG tablet Take 1 tablet by mouth daily. 15 tablet 0  . diphenhydrAMINE (BENADRYL) 25 mg capsule Take 1 capsule (25 mg total) by mouth every 6 (six) hours as needed. 30 capsule 0  . gabapentin (NEURONTIN) 100 MG capsule Take 1 capsule (100 mg total) by mouth 3 (three) times daily. 90 capsule 3  . ipratropium (ATROVENT) 0.06 % nasal spray Place 2 sprays into both nostrils 4 (four) times daily. 15 mL 0  . lamoTRIgine (LAMICTAL) 100 MG tablet Take 1.5 tablets (150 mg total) by mouth 2 (two) times daily. 270 tablet 3  . levonorgestrel (MIRENA) 20 MCG/24HR IUD 1 each by Intrauterine route once.    Marland Kitchen LINZESS 290 MCG CAPS capsule Take 290 mcg by mouth daily as needed (constipation).   3  . methylPREDNISolone (MEDROL DOSEPAK) 4 MG TBPK tablet See admin instructions.  1  . ondansetron (ZOFRAN) 8 MG tablet TAKE 1 TABLET BY MOUTH UP TO 3 TIMES A DAY AS NEEDED FOR NAUSEA/VOMITING  0  . OSCIMIN 0.125 MG tablet 0.125 mg 3 (three) times daily as needed.  0  . pantoprazole (PROTONIX) 20 MG tablet Take 1 tablet (  20 mg total) by mouth daily. 14 tablet 0  . sertraline (ZOLOFT) 100 MG tablet Take 1 tablet by mouth daily.  2  . levETIRAcetam (KEPPRA) 750 MG tablet Take 1 tablet (750 mg total) by mouth 2 (two) times daily. 60 tablet 5  . ondansetron (ZOFRAN ODT) 4 MG disintegrating tablet Take 1 tablet (4 mg total) by mouth every 8 (eight) hours as needed. 10 tablet 0   No facility-administered medications prior to visit.     PAST MEDICAL HISTORY: Past Medical History:  Diagnosis Date  . Asthma   . Convulsions/seizures ([place]) 07/27/2016   most recent 10/17/17  . H/O: C-section   . Headache   . Lipoma   . Lipoma of LUQ abdomen, s/p removal  06/27/2012 05/24/2012  . Meningioma ([place]) 07/27/2016   Anterior fossa    PAST SURGICAL HISTORY: Past Surgical History:  Procedure Laterality Date  . APPLICATION OF CRANIAL NAVIGATION N/A 12/10/2016   Procedure: APPLICATION OF CRANIAL NAVIGATION;  Surgeon: Consuella Lose, MD;  Location: [facility];  Service: Neurosurgery;  Laterality: N/A;  . BRAIN SURGERY    . BREAST SURGERY  12/30/11   breast reduction  . CESAREAN SECTION  09/09/2010   [place]  . CESAREAN SECTION N/A 01/13/2015   Procedure: CESAREAN SECTION;  Surgeon: Janyth Pupa, DO;  Location: [facility];  Service: Obstetrics;  Laterality: N/A;  EDD 01/18/15 would like [place] dressing  . CHOLECYSTECTOMY N/A 11/23/2016   Procedure: LAPAROSCOPIC CHOLECYSTECTOMY WITH INTRAOPERATIVE CHOLANGIOGRAM;  Surgeon: Donnie Mesa, MD;  Location: [facility];  Service: General;  Laterality: N/A;  . CRANIOTOMY N/A 12/10/2016   Procedure: BICORONAL CRANIOTOMY FOR RESECTION OF TUMOR;  Surgeon: Consuella Lose, MD;  Location: [facility];  Service: Neurosurgery;  Laterality: N/A;  BICORONAL CRANIOTOMY FOR RESECTION OF TUMOR   . LYMPH NODE DISSECTION  2013   Abdominal    FAMILY HISTORY: Family History  Problem Relation Age of Onset  . Kidney disease Mother        kidney stones  . Cancer Maternal Grandmother        breast  . Cancer Maternal Grandfather        Colon  . Deep vein thrombosis Maternal Grandfather        in throat  . Cancer Maternal Aunt        breast  . Heart disease Maternal Uncle   . Stroke Maternal Uncle   . Liver cancer Maternal Uncle   . Diabetes Paternal Grandfather     SOCIAL HISTORY: Social History   Socioeconomic History  . Marital status: Single    Spouse name: Not on file  . Number of children: 2  . Years of education: 62  . Highest education level: Not on file  Occupational History  . Occupation: [employer]  Social Needs  . Financial resource strain: Not on file  . Food insecurity:    Worry: Not on file    Inability: Not on file  .  Transportation needs:    Medical: Not on file    Non-medical: Not on file  Tobacco Use  . Smoking status: Never Smoker  . Smokeless tobacco: Never Used  Substance and Sexual Activity  . Alcohol use: No  . Drug use: No  . Sexual activity: Yes    Partners: Male    Birth control/protection: IUD  Lifestyle  . Physical activity:    Days per week: Not on file    Minutes per session: Not on file  . Stress: Not on file  Relationships  . Social connections:    Talks on phone: Not on file    Gets together: Not on file    Attends religious service: Not on file    Active member of club or organization: Not on file    Attends meetings of clubs or organizations: Not on file    Relationship status: Not on file  . Intimate partner violence:    Fear of current or ex partner: Not on file    Emotionally abused: Not on file    Physically abused: Not on file    Forced sexual activity: Not on file  Other Topics Concern  . Not on file  Social History Narrative   Lives at home w/ her 2 children   Right-handed   Caffeine: couple of cups per month      PHYSICAL EXAM  Vitals:   07/26/18 0845  BP: 108/72  Pulse: 89  Weight: 224 lb 8 oz (101.8 kg)  Height: 5\' 1"  (1.549 m)   Body mass index is 42.42 kg/m.  Generalized: Well developed, in no acute distress   Neurological examination  Mentation: Alert oriented to time, place, history taking. Follows all commands speech and language fluent Cranial nerve II-XII: Pupils were equal round reactive to light. Extraocular movements were full, visual field were full on confrontational test. Facial sensation and strength were normal. Uvula tongue midline. Head turning and shoulder shrug  were normal and symmetric. Motor: The motor testing reveals 5 over 5 strength of all 4 extremities. Good symmetric motor tone is noted throughout.  Sensory: Sensory testing is intact to soft touch on all 4 extremities. No evidence of extinction is noted.    Coordination: Cerebellar testing reveals good finger-nose-finger and heel-to-shin bilaterally.  Gait and station: Gait is normal. Tandem gait is normal. Romberg is negative. No drift is seen.  Reflexes: Deep tendon reflexes are symmetric and normal bilaterally.   DIAGNOSTIC DATA (LABS, IMAGING, TESTING) - I reviewed patient records, labs, notes, testing and imaging myself where available.  Lab Results  Component Value Date   WBC 6.9 03/01/2018   HGB 13.5 03/01/2018   HCT 42.5 03/01/2018   MCV 94 03/01/2018   PLT 436 03/01/2018      Component Value Date/Time   NA 142 03/01/2018 1124   K 4.8 03/01/2018 1124   CL 103 03/01/2018 1124   CO2 26 03/01/2018 1124   GLUCOSE 96 03/01/2018 1124   GLUCOSE 106 (H) 10/17/2017 0914   BUN 8 03/01/2018 1124   CREATININE 0.71 03/01/2018 1124   CALCIUM 9.5 03/01/2018 1124   PROT 6.8 03/01/2018 1124   ALBUMIN 4.3 03/01/2018 1124   AST 11 03/01/2018 1124   ALT 11 03/01/2018 1124   ALKPHOS 93 03/01/2018 1124   BILITOT 0.3 03/01/2018 1124   GFRNONAA 115 03/01/2018 1124   GFRAA 133 03/01/2018 1124      ASSESSMENT AND PLAN 32 y.o. year old female  has a past medical history of Asthma, Convulsions/seizures ([place]) (07/27/2016), H/O: C-section, Headache, Lipoma, Lipoma of LUQ abdomen, s/p removal 06/27/2012 (05/24/2012), and Meningioma ([place]) (07/27/2016). here with:  1.  Seizures  Overall the patient is doing well.  She will continue on Lamictal.  I will check blood work today.  She is advised that if her symptoms worsen or she develops new symptoms she should let us know.  She will follow-up in 6 months or sooner if needed.   I spent 15 minutes with the patient. 50% of this time was spent  reviewing plan of care   Ward Givens, MSN, NP-C 07/26/2018, 9:00 AM [facility] [address] [place], [place] [address] [phone]

## 2018-07-26 NOTE — Patient Instructions (Signed)
Your Plan:  Continue Lamictal  Blood work today If your symptoms worsen or you develop new symptoms please let us know.    Thank you for coming to see us at [facility]. I hope we have been able to provide you high quality care today.  You may receive a patient satisfaction survey over the next few weeks. We would appreciate your feedback and comments so that we may continue to improve ourselves and the health of our patients.  

## 2018-07-31 ENCOUNTER — Telehealth: Payer: Self-pay | Admitting: *Deleted

## 2018-07-31 LAB — CBC WITH DIFFERENTIAL/PLATELET
Basophils Absolute: 0 10*3/uL (ref 0.0–0.2)
Basos: 0 %
EOS (ABSOLUTE): 0.1 10*3/uL (ref 0.0–0.4)
EOS: 1 %
HEMATOCRIT: 42.2 % (ref 34.0–46.6)
HEMOGLOBIN: 13.6 g/dL (ref 11.1–15.9)
IMMATURE GRANS (ABS): 0 10*3/uL (ref 0.0–0.1)
IMMATURE GRANULOCYTES: 0 %
LYMPHS: 33 %
Lymphocytes Absolute: 2.3 10*3/uL (ref 0.7–3.1)
MCH: 29.6 pg (ref 26.6–33.0)
MCHC: 32.2 g/dL (ref 31.5–35.7)
MCV: 92 fL (ref 79–97)
MONOCYTES: 7 %
Monocytes Absolute: 0.5 10*3/uL (ref 0.1–0.9)
NEUTROS PCT: 59 %
Neutrophils Absolute: 4.1 10*3/uL (ref 1.4–7.0)
Platelets: 425 10*3/uL (ref 150–450)
RBC: 4.59 x10E6/uL (ref 3.77–5.28)
RDW: 13.6 % (ref 12.3–15.4)
WBC: 7.1 10*3/uL (ref 3.4–10.8)

## 2018-07-31 LAB — COMPREHENSIVE METABOLIC PANEL
ALT: 10 IU/L (ref 0–32)
AST: 11 IU/L (ref 0–40)
Albumin/Globulin Ratio: 1.4 (ref 1.2–2.2)
Albumin: 4.2 g/dL (ref 3.5–5.5)
Alkaline Phosphatase: 95 IU/L (ref 39–117)
BUN/Creatinine Ratio: 16 (ref 9–23)
BUN: 12 mg/dL (ref 6–20)
Bilirubin Total: 0.5 mg/dL (ref 0.0–1.2)
CALCIUM: 9.1 mg/dL (ref 8.7–10.2)
CO2: 21 mmol/L (ref 20–29)
CREATININE: 0.77 mg/dL (ref 0.57–1.00)
Chloride: 101 mmol/L (ref 96–106)
GFR, EST AFRICAN AMERICAN: 121 mL/min/{1.73_m2} (ref >59)
GFR, EST NON AFRICAN AMERICAN: 105 mL/min/{1.73_m2} (ref >59)
Globulin, Total: 2.9 g/dL (ref 1.5–4.5)
Glucose: 92 mg/dL (ref 65–99)
POTASSIUM: 4.2 mmol/L (ref 3.5–5.2)
Sodium: 138 mmol/L (ref 134–144)
TOTAL PROTEIN: 7.1 g/dL (ref 6.0–8.5)

## 2018-07-31 LAB — LAMOTRIGINE LEVEL: Lamotrigine Lvl: 2.4 ug/mL (ref 2.0–20.0)

## 2018-07-31 NOTE — Telephone Encounter (Signed)
-----   Message from Ward Givens, NP sent at 07/31/2018  3:39 PM EST ----- Labs results are unremarkable. Please call patient with results.

## 2018-07-31 NOTE — Telephone Encounter (Signed)
LMVM for pt on mobile that Lab results unremarkable. Normal range).  She is to call back if questions.

## 2018-09-05 ENCOUNTER — Emergency Department (HOSPITAL_COMMUNITY): Payer: Medicaid Other

## 2018-09-05 ENCOUNTER — Other Ambulatory Visit: Payer: Self-pay

## 2018-09-05 ENCOUNTER — Emergency Department (HOSPITAL_COMMUNITY)
Admission: EM | Admit: 2018-09-05 | Discharge: 2018-09-05 | Disposition: A | Discharge status: Home / Self Care | Payer: Medicaid Other | Attending: Emergency Medicine | Admitting: Emergency Medicine

## 2018-09-05 ENCOUNTER — Encounter (HOSPITAL_COMMUNITY): Payer: Self-pay | Admitting: Emergency Medicine

## 2018-09-05 DIAGNOSIS — Z79899 Other long term (current) drug therapy: Secondary | ICD-10-CM | POA: Diagnosis not present

## 2018-09-05 DIAGNOSIS — R569 Unspecified convulsions: Secondary | ICD-10-CM | POA: Diagnosis not present

## 2018-09-05 DIAGNOSIS — J45909 Unspecified asthma, uncomplicated: Secondary | ICD-10-CM | POA: Insufficient documentation

## 2018-09-05 DIAGNOSIS — G40909 Epilepsy, unspecified, not intractable, without status epilepticus: Secondary | ICD-10-CM | POA: Insufficient documentation

## 2018-09-05 LAB — BASIC METABOLIC PANEL
Anion gap: 7 (ref 5–15)
BUN: 13 mg/dL (ref 6–20)
CALCIUM: 8.7 mg/dL — AB (ref 8.9–10.3)
CO2: 25 mmol/L (ref 22–32)
CREATININE: 0.73 mg/dL (ref 0.44–1.00)
Chloride: 106 mmol/L (ref 98–111)
GFR calc Af Amer: >60 mL/min (ref >60)
GFR calc non Af Amer: >60 mL/min (ref >60)
Glucose, Bld: 96 mg/dL (ref 70–99)
Potassium: 3.4 mmol/L — ABNORMAL LOW (ref 3.5–5.1)
SODIUM: 138 mmol/L (ref 135–145)

## 2018-09-05 LAB — CBC WITH DIFFERENTIAL/PLATELET
Abs Immature Granulocytes: 0.05 10*3/uL (ref 0.00–0.07)
BASOS ABS: 0 10*3/uL (ref 0.0–0.1)
Basophils Relative: 0 %
Eosinophils Absolute: 0.1 10*3/uL (ref 0.0–0.5)
Eosinophils Relative: 1 %
HEMATOCRIT: 41.1 % (ref 36.0–46.0)
Hemoglobin: 12.9 g/dL (ref 12.0–15.0)
Immature Granulocytes: 1 %
LYMPHS ABS: 1.9 10*3/uL (ref 0.7–4.0)
Lymphocytes Relative: 28 %
MCH: 29.8 pg (ref 26.0–34.0)
MCHC: 31.4 g/dL (ref 30.0–36.0)
MCV: 94.9 fL (ref 80.0–100.0)
Monocytes Absolute: 0.4 10*3/uL (ref 0.1–1.0)
Monocytes Relative: 5 %
Neutro Abs: 4.5 10*3/uL (ref 1.7–7.7)
Neutrophils Relative %: 65 %
Platelets: 421 10*3/uL — ABNORMAL HIGH (ref 150–400)
RBC: 4.33 MIL/uL (ref 3.87–5.11)
RDW: 13.5 % (ref 11.5–15.5)
WBC: 6.9 10*3/uL (ref 4.0–10.5)
nRBC: 0 % (ref 0.0–0.2)

## 2018-09-05 LAB — CBG MONITORING, ED
Glucose-Capillary: 100 mg/dL — ABNORMAL HIGH (ref 70–99)
Glucose-Capillary: 91 mg/dL (ref 70–99)

## 2018-09-05 LAB — I-STAT BETA HCG BLOOD, ED (MC, WL, AP ONLY): I-stat hCG, quantitative: <5 m[IU]/mL (ref <5)

## 2018-09-05 MED ORDER — LEVETIRACETAM 1000 MG PO TABS: 1000.0000 mg | ORAL_TABLET | Freq: Two times a day (BID) | ORAL | 0 refills | Status: DC

## 2018-09-05 MED ORDER — LEVETIRACETAM 500 MG PO TABS
1000.0000 mg | ORAL_TABLET | Freq: Once | ORAL | Status: AC
  Administered 2018-09-05: 1000 mg via ORAL
  Filled 2018-09-05: qty 2

## 2018-09-05 MED ORDER — SODIUM CHLORIDE 0.9 % IV BOLUS
1000.0000 mL | Freq: Once | INTRAVENOUS | Status: AC
  Administered 2018-09-05: 1000 mL via INTRAVENOUS

## 2018-09-05 NOTE — ED Notes (Signed)
Pt ambulated to the restroom with no difficulty  

## 2018-09-05 NOTE — ED Triage Notes (Signed)
Per EMS: Pt's neighbor was at her house and he heard a noise and the neighbor found the pt on the floor in the bathroom having a seizure.  Upon EMS arrival, pt was postictal and A&O x 2.  Pt has hx of seizure.  Last seizure was about a year ago.  Mild trauma to bilateral sides of tongue.  Passed [facility].  Pt on lamictal, sertraline, and albuterol.  Pt states she is compliant with meds.

## 2018-09-05 NOTE — ED Notes (Signed)
Pt given crackers, peanut butter, and water per request

## 2018-09-05 NOTE — Discharge Instructions (Signed)
You will need to restart your Keppra. Continue your Lamictal as prescribed. Follow-up with your neurologist and primary care provider. Return to ED for worsening symptoms, additional seizure, head injuries or falls, numbness in arms or legs. You will need to refrain from driving until cleared by your neurologist.

## 2018-09-05 NOTE — ED Provider Notes (Signed)
[facility] DEPT Provider Note   CSN: [id number] Arrival date & time: 09/05/18  1935     History   Chief Complaint No chief complaint on file.   HPI Kendra [name] is a 33 y.o. female with a past medical history of seizures currently on Lamictal 100 mg twice a day, meningioma status post resection on 12/10/2016 presents to ED for evaluation of breakthrough seizure.  Patient states that she was at home with a female friend when she believes she had a tonic-clonic seizure.  This was unwitnessed.  However, her friend found her after he heard a thud when she hit the floor.  She notes having an anterior headache, some tongue pain after the seizure.  She is unsure how long she sees for.  She states that her last breakthrough seizure was about 1 year ago.  She reports compliance with her home Lamictal including today.  She does note loss of consciousness after the seizure, postictal state which she states is similar to her prior seizures.  Denies any changes to medication, feelings of dehydration, vomiting, vision changes, urinary incontinence, fever.  She last saw her neurologist on 07/26/2018 and has since been discontinued on her [place].  HPI  Past Medical History:  Diagnosis Date  . Asthma   . Convulsions/seizures ([place]) 07/27/2016   most recent 10/17/17  . H/O: C-section   . Headache   . Lipoma   . Lipoma of LUQ abdomen, s/p removal 06/27/2012 05/24/2012  . Meningioma ([place]) 07/27/2016   Anterior fossa    Patient Active Problem List   Diagnosis Date Noted  . Convulsions/seizures ([place]) 07/27/2016  . Meningioma ([place]) 07/27/2016  . Common migraine with intractable migraine 07/27/2016  . Status post repeat low transverse cesarean section 01/14/2015  . Lipoma of LUQ abdomen, s/p removal 06/27/2012 05/24/2012  . 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 Chlamydia infection 12/13/2011  . 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 APPLICATION OF CRANIAL NAVIGATION N/A 12/10/2016   Procedure: APPLICATION OF CRANIAL NAVIGATION;  Surgeon: Consuella Lose, MD;  Location: [facility];  Service: Neurosurgery;  Laterality: N/A;  . BRAIN SURGERY    . BREAST SURGERY  12/30/11   breast reduction  . CESAREAN SECTION  09/09/2010   [place]  . CESAREAN SECTION N/A 01/13/2015   Procedure: CESAREAN SECTION;  Surgeon: Janyth Pupa, DO;  Location: [facility];  Service: Obstetrics;  Laterality: N/A;  EDD 01/18/15 would like [place] dressing  . CHOLECYSTECTOMY N/A 11/23/2016   Procedure: LAPAROSCOPIC CHOLECYSTECTOMY WITH INTRAOPERATIVE CHOLANGIOGRAM;  Surgeon: Donnie Mesa, MD;  Location: [facility];  Service: General;  Laterality: N/A;  . CRANIOTOMY N/A 12/10/2016   Procedure: BICORONAL CRANIOTOMY FOR RESECTION OF TUMOR;  Surgeon: Consuella Lose, MD;  Location: [facility];  Service: Neurosurgery;  Laterality: N/A;  BICORONAL CRANIOTOMY FOR RESECTION OF TUMOR   . LYMPH NODE DISSECTION  2013   Abdominal     OB History    Gravida  2   Para  2   Term  2   Preterm      AB      Living  2     SAB      TAB      Ectopic      Multiple  0   Live Births  1            Home Medications    Prior to Admission medications  Medication Sig Start Date End Date Taking? Authorizing Provider  acetaminophen (TYLENOL) 500 MG tablet Take 1,000 mg by mouth every 6 (six) hours as needed (for pain/headaches.).   Yes [provider]  albuterol (PROVENTIL HFA;VENTOLIN HFA) 108 (90 BASE) MCG/ACT inhaler Inhale 2 puffs into the lungs every 6 (six) hours as needed for wheezing or shortness of breath.   Yes [provider]  diphenhydrAMINE (BENADRYL) 25 mg capsule Take 1 capsule (25 mg total) by mouth every 6 (six) hours as needed. 07/17/16  Yes Mackuen, Courteney Lyn, MD  ipratropium (ATROVENT) 0.06 % nasal spray Place 2 sprays into both nostrils 4 (four) times daily. 09/30/17  Yes Yu, Amy V, PA-C    lamoTRIgine (LAMICTAL) 100 MG tablet Take 1.5 tablets (150 mg total) by mouth 2 (two) times daily. Patient taking differently: Take 200 mg by mouth 2 (two) times daily.  07/26/18  Yes Ward Givens, NP  levonorgestrel (MIRENA) 20 MCG/24HR IUD 1 each by Intrauterine route once.   Yes [provider]  LINZESS 290 MCG CAPS capsule Take 290 mcg by mouth daily as needed (constipation).  07/16/16  Yes [provider]  sertraline (ZOLOFT) 100 MG tablet Take 1 tablet by mouth daily. 06/09/18  Yes [provider]  benzonatate (TESSALON) 100 MG capsule Take 1 capsule (100 mg total) by mouth every 8 (eight) hours. Patient not taking: Reported on 09/05/2018 09/30/17   Ok Edwards, PA-C  cetirizine-pseudoephedrine (ZYRTEC-D) 5-120 MG tablet Take 1 tablet by mouth daily. Patient not taking: Reported on 09/05/2018 09/30/17   Ok Edwards, PA-C  gabapentin (NEURONTIN) 100 MG capsule Take 1 capsule (100 mg total) by mouth 3 (three) times daily. Patient not taking: Reported on 09/05/2018 08/01/17   Ward Givens, NP  levETIRAcetam (KEPPRA) 1000 MG tablet Take 1 tablet (1,000 mg total) by mouth 2 (two) times daily. 09/05/18   Rease Swinson, PA-C  pantoprazole (PROTONIX) 20 MG tablet Take 1 tablet (20 mg total) by mouth daily. Patient not taking: Reported on 09/05/2018 10/05/17   Robinson, [place] N, PA-C    Family History Family History  Problem Relation Age of Onset  . Kidney disease Mother        kidney stones  . Cancer Maternal Grandmother        breast  . Cancer Maternal Grandfather        Colon  . Deep vein thrombosis Maternal Grandfather        in throat  . Cancer Maternal Aunt        breast  . Heart disease Maternal Uncle   . Stroke Maternal Uncle   . Liver cancer Maternal Uncle   . Diabetes Paternal Grandfather     Social History Social History   Tobacco Use  . Smoking status: Never Smoker  . Smokeless tobacco: Never Used  Substance Use Topics  . Alcohol use: No  . Drug use:  No     Allergies   Asa buff (mag [buffered aspirin]; Erythromycin; Iodine; Peanut-containing drug products; Shellfish allergy; and Ibuprofen   Review of Systems Review of Systems  Constitutional: Negative for appetite change, chills and fever.  HENT: Negative for ear pain, rhinorrhea, sneezing and sore throat.   Eyes: Negative for photophobia and visual disturbance.  Respiratory: Negative for cough, chest tightness, shortness of breath and wheezing.   Cardiovascular: Negative for chest pain and palpitations.  Gastrointestinal: Negative for abdominal pain, blood in stool, constipation, diarrhea, nausea and vomiting.  Genitourinary: Negative for dysuria, hematuria and  urgency.  Musculoskeletal: Negative for myalgias.  Skin: Negative for rash.  Neurological: Positive for seizures and headaches. Negative for dizziness, weakness and light-headedness.     Physical Exam Updated Vital Signs BP 116/89   Pulse 82   Temp 98.2 F (36.8 C) (Oral)   Resp 17   SpO2 100%   Physical Exam Vitals signs and nursing note reviewed.  Constitutional:      General: She is not in acute distress.    Appearance: She is well-developed.  HENT:     Head: Normocephalic and atraumatic.     Comments: No tongue trauma noted.    Nose: Nose normal.  Eyes:     General: No scleral icterus.       Right eye: No discharge.        Left eye: No discharge.     Conjunctiva/sclera: Conjunctivae normal.     Pupils: Pupils are equal, round, and reactive to light.  Neck:     Musculoskeletal: Normal range of motion and neck supple.  Cardiovascular:     Rate and Rhythm: Normal rate and regular rhythm.     Heart sounds: Normal heart sounds. No murmur. No friction rub. No gallop.   Pulmonary:     Effort: Pulmonary effort is normal. No respiratory distress.     Breath sounds: Normal breath sounds.  Abdominal:     General: Bowel sounds are normal. There is no distension.     Palpations: Abdomen is soft.      Tenderness: There is no abdominal tenderness. There is no guarding.  Musculoskeletal: Normal range of motion.  Skin:    General: Skin is warm and dry.     Findings: No rash.  Neurological:     General: No focal deficit present.     Mental Status: She is alert and oriented to person, place, and time.     Cranial Nerves: No cranial nerve deficit.     Sensory: No sensory deficit.     Motor: No weakness or abnormal muscle tone.     Coordination: Coordination normal.     Comments: Pupils reactive. No facial asymmetry noted. Cranial nerves appear grossly intact. Sensation intact to light touch on face, BUE and BLE. Strength 5/5 in BUE and BLE.      ED Treatments / Results  Labs (all labs ordered are listed, but only abnormal results are displayed) Labs Reviewed  BASIC METABOLIC PANEL - Abnormal; Notable for the following components:      Result Value   Potassium 3.4 (*)    Calcium 8.7 (*)    All other components within normal limits  CBC WITH DIFFERENTIAL/PLATELET - Abnormal; Notable for the following components:   Platelets 421 (*)    All other components within normal limits  CBG MONITORING, ED - Abnormal; Notable for the following components:   Glucose-Capillary 100 (*)    All other components within normal limits  CBG MONITORING, ED  I-STAT BETA HCG BLOOD, ED ([facility], [facility], [facility] ONLY)    EKG None  Radiology Dg Chest 2 View  Result Date: 09/05/2018 CLINICAL DATA:  33 y/o  F; seizures. EXAM: CHEST - 2 VIEW COMPARISON:  11/28/2017 chest radiograph FINDINGS: Stable heart size and mediastinal contours are within normal limits. Both lungs are clear. The visualized skeletal structures are unremarkable. IMPRESSION: No acute pulmonary process identified. Electronically Signed   By: Kristine Garbe M.D.   On: 09/05/2018 21:29   Ct Head Wo Contrast  Result Date: 09/05/2018 CLINICAL DATA:  Seizure EXAM: CT HEAD WITHOUT CONTRAST TECHNIQUE: Contiguous axial images were obtained from the  base of the skull through the vertex without intravenous contrast. COMPARISON:  CT 10/17/2017, MRI 11/15/2017 FINDINGS: Brain: No acute territorial infarction, hemorrhage or intracranial mass. Stable ventricle size. Vascular: No hyperdense vessels.  No unexpected calcification. Skull: Frontal craniotomy. Sinuses/Orbits: Mucosal thickening in the maxillary ethmoid and frontal sinus. Other: None IMPRESSION: 1. No CT evidence for acute intracranial abnormality. 2. 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   On: 09/05/2018 21:22    Procedures Procedures (including critical care time)  Medications Ordered in ED Medications  sodium chloride 0.9 % bolus 1,000 mL (0 mLs Intravenous Stopped 09/05/18 2132)  levETIRAcetam (KEPPRA) tablet 1,000 mg (1,000 mg Oral Given 09/05/18 2303)     Initial Impression / Assessment and Plan / ED Course  I have reviewed the triage vital signs and the nursing notes.  Pertinent labs & imaging results that were available during my care of the patient were reviewed by me and considered in my medical decision making (see chart for details).  Clinical Course as of Sep 05 2329  [place] Sep 05, 2018  2155 Patient continues to rest comfortably.   [HK]  2213 Spoke to Dr. Leonel Ramsay of neurology.  He recommends that patient be restarted on her Keppra with her last dosing.  He feels that her breakthrough seizure may have been from discontinuing the [place] on 11/27.  Recommends p.o. dose here and continuing Lamictal at home with follow-up with neurology.   [HK]    Clinical Course User Index [HK] Delia Heady, PA-C    33 year old female with a past medical history of seizures currently on Lamictal who presents to ED for breakthrough seizure that occurred prior to arrival.  States that she took her nightly Lamictal dose of 200 mg (although she was prescribed 150 mg) and then had a breakthrough  seizure.  She was discontinued on her Keppra on 07/26/2018 by her neurologist.  She notes postictal state after.  She notes a headache from where she believes she had a head injury.  On exam there are no deficits on neurological exam noted.  No meningismus.  She is afebrile.  Other vital signs within normal limits.  Lab work including CBC, BMP, CBG and hCG are unremarkable.  Chest x-ray is unremarkable.  CT of the head is negative for acute abnormality.  Spoke to Dr. Leonel Ramsay who recommends that patient be restarted on her Keppra.  Neurology notes states that she was on 1 g of Keppra twice a day as well as the Lamictal.  He feels that her Lamictal dose is appropriate.  Patient was observed here with no recurrence of her seizures.  Will advise her to follow-up with neurologist and to return to ED for any severe worsening symptoms.  Patient is hemodynamically stable, in NAD, and able to ambulate in the ED. Evaluation does not show pathology that would require ongoing emergent intervention or inpatient treatment. I explained the diagnosis to the patient. Pain has been managed and has no complaints prior to discharge. Patient is comfortable with above plan and is stable for discharge at this time. All questions were answered prior to disposition. Strict return precautions for returning to the ED were discussed. Encouraged follow up with PCP.    Portions of this note were generated with Lobbyist. Dictation errors may occur despite best attempts at proofreading.  Final Clinical  Impressions(s) / ED Diagnoses   Final diagnoses:  Seizure [facility])    ED Discharge Orders         Ordered    levETIRAcetam (KEPPRA) 1000 MG tablet  2 times daily     09/05/18 2246           Delia Heady, PA-C 09/05/18 8677    Tegeler, Gwenyth Allegra, MD 09/05/18 [phone]

## 2018-10-04 ENCOUNTER — Ambulatory Visit
Admission: RE | Admit: 2018-10-04 | Discharge: 2018-10-04 | Disposition: A | Discharge status: Home / Self Care | Payer: 59 | Source: Ambulatory Visit | Attending: Family Medicine | Admitting: Family Medicine

## 2018-10-04 ENCOUNTER — Other Ambulatory Visit: Payer: Self-pay | Admitting: Family Medicine

## 2018-10-04 DIAGNOSIS — M79672 Pain in left foot: Secondary | ICD-10-CM

## 2018-10-04 DIAGNOSIS — M7989 Other specified soft tissue disorders: Secondary | ICD-10-CM

## 2018-10-17 ENCOUNTER — Encounter: Payer: Self-pay | Admitting: Adult Health

## 2018-10-17 ENCOUNTER — Other Ambulatory Visit: Payer: Self-pay | Admitting: Adult Health

## 2018-10-18 ENCOUNTER — Encounter: Payer: Self-pay | Admitting: Adult Health

## 2018-10-18 NOTE — Telephone Encounter (Signed)
I  Refilled keppra 1000mg  po bid to [facility] at [address].  30 day supply.  Made appt 11-01-18 at 1015 with SS/NP. FYI

## 2018-11-01 ENCOUNTER — Ambulatory Visit: Payer: Self-pay | Admitting: Neurology

## 2018-11-01 ENCOUNTER — Telehealth: Payer: Self-pay

## 2018-11-01 NOTE — Telephone Encounter (Signed)
Patient was a no call/no show for their appointment today.   

## 2018-11-01 NOTE — Progress Notes (Deleted)
PATIENT: Kendra [name] DOB: Nov 21, 1985  REASON FOR VISIT: follow up HISTORY FROM: patient  HISTORY OF PRESENT ILLNESS: Today 11/01/18  Kendra [name] is a 33 year old female who presents for follow-up after ED admission for seizure on September 05, 2018.  She has history of seizures since April 2018 after meningioma resection. In January 2020 apparently she Had a breakthrough seizure that was unwitnessed, her friend heard a thud when she hit the floor, making her believe she had a tonic-clonic seizure. The ambulance was called and she was postictal initially.  She had mild trauma to bilateral sides of her tongue. She reports she was compliant with her Lamictal at that time.  Her Keppra was discontinued in July 2019 due to reported side effect of anger outburst. While in the ED she was restarted on Keppra, 1 g twice a day in addition to her Lamictal.   HISTORY 07/26/2018 MM Kendra [name] is a 33 year old female with a history of seizures.  She returns today for follow-up.  She is currently on Lamictal taking 150 mg twice a day.  She denies any seizure events.  Denies any changes in her gait or balance.  Reports that she is tolerating the medication well.  Denies any changes in her mood or behavior.  She was recently placed on Zoloft by her PCP.  She returns today for an evaluation.  REVIEW OF SYSTEMS: Out of a complete 14 system review of symptoms, the patient complains only of the following symptoms, and all other reviewed systems are negative.  ALLERGIES: Allergies  Allergen Reactions  . Asa Buff (Mag [Buffered Aspirin] Anaphylaxis  . Erythromycin Anaphylaxis  . Iodine Anaphylaxis and Hives  . Peanut-Containing Drug Products Anaphylaxis and Hives    Pt is allergic to peanut oil  . Shellfish Allergy Anaphylaxis, Hives, Swelling and Other (See Comments)    Pt throat swells  . Ibuprofen Rash    HOME MEDICATIONS: Outpatient Medications Prior to Visit  Medication Sig Dispense Refill  .  acetaminophen (TYLENOL) 500 MG tablet Take 1,000 mg by mouth every 6 (six) hours as needed (for pain/headaches.).    Marland Kitchen albuterol (PROVENTIL HFA;VENTOLIN HFA) 108 (90 BASE) MCG/ACT inhaler Inhale 2 puffs into the lungs every 6 (six) hours as needed for wheezing or shortness of breath.    . benzonatate (TESSALON) 100 MG capsule Take 1 capsule (100 mg total) by mouth every 8 (eight) hours. (Patient not taking: Reported on 09/05/2018) 21 capsule 0  . cetirizine-pseudoephedrine (ZYRTEC-D) 5-120 MG tablet Take 1 tablet by mouth daily. (Patient not taking: Reported on 09/05/2018) 15 tablet 0  . diphenhydrAMINE (BENADRYL) 25 mg capsule Take 1 capsule (25 mg total) by mouth every 6 (six) hours as needed. 30 capsule 0  . gabapentin (NEURONTIN) 100 MG capsule Take 1 capsule (100 mg total) by mouth 3 (three) times daily. (Patient not taking: Reported on 09/05/2018) 90 capsule 3  . ipratropium (ATROVENT) 0.06 % nasal spray Place 2 sprays into both nostrils 4 (four) times daily. 15 mL 0  . lamoTRIgine (LAMICTAL) 100 MG tablet Take 1.5 tablets (150 mg total) by mouth 2 (two) times daily. (Patient taking differently: Take 200 mg by mouth 2 (two) times daily. ) 60 tablet 5  . levETIRAcetam (KEPPRA) 1000 MG tablet TAKE 1 TABLET BY MOUTH TWICE DAILY 60 tablet 0  . levonorgestrel (MIRENA) 20 MCG/24HR IUD 1 each by Intrauterine route once.    Marland Kitchen LINZESS 290 MCG CAPS capsule Take 290 mcg by mouth daily as needed (  constipation).   3  . pantoprazole (PROTONIX) 20 MG tablet Take 1 tablet (20 mg total) by mouth daily. (Patient not taking: Reported on 09/05/2018) 14 tablet 0  . sertraline (ZOLOFT) 100 MG tablet Take 1 tablet by mouth daily.  2   No facility-administered medications prior to visit.     PAST MEDICAL HISTORY: Past Medical History:  Diagnosis Date  . Asthma   . Convulsions/seizures ([place]) 07/27/2016   most recent 10/17/17  . H/O: C-section   . Headache   . Lipoma   . Lipoma of LUQ abdomen, s/p removal 06/27/2012  05/24/2012  . Meningioma ([place]) 07/27/2016   Anterior fossa    PAST SURGICAL HISTORY: Past Surgical History:  Procedure Laterality Date  . APPLICATION OF CRANIAL NAVIGATION N/A 12/10/2016   Procedure: APPLICATION OF CRANIAL NAVIGATION;  Surgeon: Consuella Lose, MD;  Location: [facility];  Service: Neurosurgery;  Laterality: N/A;  . BRAIN SURGERY    . BREAST SURGERY  12/30/11   breast reduction  . CESAREAN SECTION  09/09/2010   [place]  . CESAREAN SECTION N/A 01/13/2015   Procedure: CESAREAN SECTION;  Surgeon: Janyth Pupa, DO;  Location: [facility];  Service: Obstetrics;  Laterality: N/A;  EDD 01/18/15 would like [place] dressing  . CHOLECYSTECTOMY N/A 11/23/2016   Procedure: LAPAROSCOPIC CHOLECYSTECTOMY WITH INTRAOPERATIVE CHOLANGIOGRAM;  Surgeon: Donnie Mesa, MD;  Location: [facility];  Service: General;  Laterality: N/A;  . CRANIOTOMY N/A 12/10/2016   Procedure: BICORONAL CRANIOTOMY FOR RESECTION OF TUMOR;  Surgeon: Consuella Lose, MD;  Location: [facility];  Service: Neurosurgery;  Laterality: N/A;  BICORONAL CRANIOTOMY FOR RESECTION OF TUMOR   . LYMPH NODE DISSECTION  2013   Abdominal    FAMILY HISTORY: Family History  Problem Relation Age of Onset  . Kidney disease Mother        kidney stones  . Cancer Maternal Grandmother        breast  . Cancer Maternal Grandfather        Colon  . Deep vein thrombosis Maternal Grandfather        in throat  . Cancer Maternal Aunt        breast  . Heart disease Maternal Uncle   . Stroke Maternal Uncle   . Liver cancer Maternal Uncle   . Diabetes Paternal Grandfather     SOCIAL HISTORY: Social History   Socioeconomic History  . Marital status: Single    Spouse name: Not on file  . Number of children: 2  . Years of education: 20  . Highest education level: Not on file  Occupational History  . Occupation: [employer]  Social Needs  . Financial resource strain: Not on file  . Food insecurity:    Worry: Not on file    Inability: Not on file  .  Transportation needs:    Medical: Not on file    Non-medical: Not on file  Tobacco Use  . Smoking status: Never Smoker  . Smokeless tobacco: Never Used  Substance and Sexual Activity  . Alcohol use: No  . Drug use: No  . Sexual activity: Yes    Partners: Male    Birth control/protection: I.U.D.  Lifestyle  . Physical activity:    Days per week: Not on file    Minutes per session: Not on file  . Stress: Not on file  Relationships  . Social connections:    Talks on phone: Not on file    Gets together: Not on file    Attends religious service:  Not on file    Active member of club or organization: Not on file    Attends meetings of clubs or organizations: Not on file    Relationship status: Not on file  . Intimate partner violence:    Fear of current or ex partner: Not on file    Emotionally abused: Not on file    Physically abused: Not on file    Forced sexual activity: Not on file  Other Topics Concern  . Not on file  Social History Narrative   Lives at home w/ her 2 children   Right-handed   Caffeine: couple of cups per month      PHYSICAL EXAM  There were no vitals filed for this visit. There is no height or weight on file to calculate BMI.  Generalized: Well developed, in no acute distress   Neurological examination  Mentation: Alert oriented to time, place, history taking. Follows all commands speech and language fluent Cranial nerve II-XII: Pupils were equal round reactive to light. Extraocular movements were full, visual field were full on confrontational test. Facial sensation and strength were normal. Uvula tongue midline. Head turning and shoulder shrug  were normal and symmetric. Motor: The motor testing reveals 5 over 5 strength of all 4 extremities. Good symmetric motor tone is noted throughout.  Sensory: Sensory testing is intact to soft touch on all 4 extremities. No evidence of extinction is noted.  Coordination: Cerebellar testing reveals good  finger-nose-finger and heel-to-shin bilaterally.  Gait and station: Gait is normal. Tandem gait is normal. Romberg is negative. No drift is seen.  Reflexes: Deep tendon reflexes are symmetric and normal bilaterally.   DIAGNOSTIC DATA (LABS, IMAGING, TESTING) - I reviewed patient records, labs, notes, testing and imaging myself where available.  Lab Results  Component Value Date   WBC 6.9 09/05/2018   HGB 12.9 09/05/2018   HCT 41.1 09/05/2018   MCV 94.9 09/05/2018   PLT 421 (H) 09/05/2018      Component Value Date/Time   NA 138 09/05/2018 2024   NA 138 07/26/2018 0925   K 3.4 (L) 09/05/2018 2024   CL 106 09/05/2018 2024   CO2 25 09/05/2018 2024   GLUCOSE 96 09/05/2018 2024   BUN 13 09/05/2018 2024   BUN 12 07/26/2018 0925   CREATININE 0.73 09/05/2018 2024   CALCIUM 8.7 (L) 09/05/2018 2024   PROT 7.1 07/26/2018 0925   ALBUMIN 4.2 07/26/2018 0925   AST 11 07/26/2018 0925   ALT 10 07/26/2018 0925   ALKPHOS 95 07/26/2018 0925   BILITOT 0.5 07/26/2018 0925   GFRNONAA >60 09/05/2018 2024   GFRAA >60 09/05/2018 2024   No results found for: CHOL, HDL, LDLCALC, LDLDIRECT, TRIG, CHOLHDL No results found for: HGBA1C No results found for: VITAMINB12 No results found for: TSH    ASSESSMENT AND PLAN 33 y.o. year old female  has a past medical history of Asthma, Convulsions/seizures ([place]) (07/27/2016), H/O: C-section, Headache, Lipoma, Lipoma of LUQ abdomen, s/p removal 06/27/2012 (05/24/2012), and Meningioma ([place]) (07/27/2016). here with ***   I spent 15 minutes with the patient. 50% of this time was spent   Butler Denmark, [place], DNP 11/01/2018, 7:43 AM [facility] [name], [place], [place] [address] [phone]

## 2018-11-02 ENCOUNTER — Encounter: Payer: Self-pay | Admitting: Adult Health

## 2018-11-02 ENCOUNTER — Encounter: Payer: Self-pay | Admitting: Neurology

## 2018-12-09 ENCOUNTER — Encounter: Payer: Self-pay | Admitting: Adult Health

## 2018-12-11 ENCOUNTER — Encounter: Payer: Self-pay | Admitting: Adult Health

## 2018-12-21 ENCOUNTER — Other Ambulatory Visit (HOSPITAL_COMMUNITY): Payer: Self-pay | Admitting: Neurosurgery

## 2018-12-21 DIAGNOSIS — D329 Benign neoplasm of meninges, unspecified: Secondary | ICD-10-CM

## 2019-01-30 ENCOUNTER — Ambulatory Visit (HOSPITAL_COMMUNITY): Admission: RE | Admit: 2019-01-30 | Payer: Medicaid Other | Source: Ambulatory Visit

## 2019-01-30 ENCOUNTER — Encounter (HOSPITAL_COMMUNITY): Payer: Self-pay

## 2019-02-06 ENCOUNTER — Other Ambulatory Visit: Payer: Self-pay | Admitting: Adult Health

## 2019-02-22 ENCOUNTER — Encounter: Payer: Self-pay | Admitting: Adult Health

## 2019-02-22 ENCOUNTER — Telehealth: Payer: Self-pay | Admitting: *Deleted

## 2019-02-22 NOTE — Telephone Encounter (Signed)
LMVM for pt to r/s 0730 appt to another time 1430? If ok same day with MM/NP 02-26-19 due to NP schedule change.  Also may also convert to VV doxy or mychart.  Pt to call back.

## 2019-02-23 NOTE — Telephone Encounter (Signed)
Pt has called and rescheduled her appointment from 06-29. Pt is scheduled for 07-07 at 3pm, no call back requested

## 2019-02-26 ENCOUNTER — Ambulatory Visit: Payer: Medicaid Other | Admitting: Adult Health

## 2019-02-26 NOTE — Telephone Encounter (Signed)
Noted  

## 2019-03-06 ENCOUNTER — Encounter: Payer: Self-pay | Admitting: Adult Health

## 2019-03-06 ENCOUNTER — Telehealth (INDEPENDENT_AMBULATORY_CARE_PROVIDER_SITE_OTHER): Payer: Medicaid Other | Admitting: Adult Health

## 2019-03-06 DIAGNOSIS — Z5181 Encounter for therapeutic drug level monitoring: Secondary | ICD-10-CM | POA: Diagnosis not present

## 2019-03-06 DIAGNOSIS — R569 Unspecified convulsions: Secondary | ICD-10-CM | POA: Diagnosis not present

## 2019-03-06 MED ORDER — LAMOTRIGINE 100 MG PO TABS: 200.0000 mg | ORAL_TABLET | Freq: Two times a day (BID) | ORAL | 5 refills | Status: DC

## 2019-03-06 NOTE — Progress Notes (Signed)
I have read the note, and I agree with the clinical assessment and plan.  Charles K Willis   

## 2019-03-06 NOTE — Progress Notes (Signed)
PATIENT: Kendra [name] DOB: Feb 20, 1986  REASON FOR VISIT: follow up HISTORY FROM: patient  Virtual Visit via Video Note  I connected with [place] on 03/06/19 at  3:00 PM EDT by a video enabled telemedicine application located remotely at [facility] and verified that I am speaking with the correct person using two identifiers who was located at their own home.   I discussed the limitations of evaluation and management by telemedicine and the availability of in person appointments. The patient expressed understanding and agreed to proceed.   PATIENT: Kendra [name] DOB: Mar 22, 1986  REASON FOR VISIT: follow up HISTORY FROM: patient  HISTORY OF PRESENT ILLNESS: Today 03/06/19 : Kendra [name] is a 33 year old female with a history of seizures.  She returns today for follow-up.  She reports that she was placed on Keppra in December after having a seizure however she has discontinued this medication.  She states that she does not tolerate Keppra well.  In the past she had anger outburst while on Keppra.  She reports that she is taking Lamictal 200 mg twice a day.  She states that she has been tolerating this well.  Denies any changes in her gait or balance.  Denies any additional seizure events.  She is operating a motor vehicle.  She joins me today for virtual visit.  HISTORY 07/26/18:  Kendra [name] is a 33 year old female with a history of seizures.  She returns today for follow-up.  She is currently on Lamictal taking 150 mg twice a day.  She denies any seizure events.  Denies any changes in her gait or balance.  Reports that she is tolerating the medication well.  Denies any changes in her mood or behavior.  She was recently placed on Zoloft by her PCP.  She returns today for an evaluation.   REVIEW OF SYSTEMS: Out of a complete 14 system review of symptoms, the patient complains only of the following symptoms, and all other reviewed systems are negative.  See  HPI  ALLERGIES: Allergies  Allergen Reactions  . Asa Buff (Mag [Buffered Aspirin] Anaphylaxis  . Erythromycin Anaphylaxis  . Iodine Anaphylaxis and Hives  . Peanut-Containing Drug Products Anaphylaxis and Hives    Pt is allergic to peanut oil  . Shellfish Allergy Anaphylaxis, Hives, Swelling and Other (See Comments)    Pt throat swells  . Ibuprofen Rash    HOME MEDICATIONS: Outpatient Medications Prior to Visit  Medication Sig Dispense Refill  . acetaminophen (TYLENOL) 500 MG tablet Take 1,000 mg by mouth every 6 (six) hours as needed (for pain/headaches.).    Marland Kitchen albuterol (PROVENTIL HFA;VENTOLIN HFA) 108 (90 BASE) MCG/ACT inhaler Inhale 2 puffs into the lungs every 6 (six) hours as needed for wheezing or shortness of breath.    . benzonatate (TESSALON) 100 MG capsule Take 1 capsule (100 mg total) by mouth every 8 (eight) hours. (Patient not taking: Reported on 09/05/2018) 21 capsule 0  . cetirizine-pseudoephedrine (ZYRTEC-D) 5-120 MG tablet Take 1 tablet by mouth daily. (Patient not taking: Reported on 09/05/2018) 15 tablet 0  . diphenhydrAMINE (BENADRYL) 25 mg capsule Take 1 capsule (25 mg total) by mouth every 6 (six) hours as needed. 30 capsule 0  . gabapentin (NEURONTIN) 100 MG capsule Take 1 capsule (100 mg total) by mouth 3 (three) times daily. (Patient not taking: Reported on 09/05/2018) 90 capsule 3  . ipratropium (ATROVENT) 0.06 % nasal spray Place 2 sprays into both nostrils 4 (four) times daily.  15 mL 0  . lamoTRIgine (LAMICTAL) 100 MG tablet TAKE 1 AND 1/2 TABLETS(150 MG) BY MOUTH TWICE DAILY 60 tablet 5  . levETIRAcetam (KEPPRA) 1000 MG tablet TAKE 1 TABLET BY MOUTH TWICE DAILY 60 tablet 0  . levonorgestrel (MIRENA) 20 MCG/24HR IUD 1 each by Intrauterine route once.    Marland Kitchen LINZESS 290 MCG CAPS capsule Take 290 mcg by mouth daily as needed (constipation).   3  . pantoprazole (PROTONIX) 20 MG tablet Take 1 tablet (20 mg total) by mouth daily. (Patient not taking: Reported on 09/05/2018)  14 tablet 0  . sertraline (ZOLOFT) 100 MG tablet Take 1 tablet by mouth daily.  2   No facility-administered medications prior to visit.     PAST MEDICAL HISTORY: Past Medical History:  Diagnosis Date  . Asthma   . Convulsions/seizures ([place]) 07/27/2016   most recent 10/17/17  . H/O: C-section   . Headache   . Lipoma   . Lipoma of LUQ abdomen, s/p removal 06/27/2012 05/24/2012  . Meningioma ([place]) 07/27/2016   Anterior fossa    PAST SURGICAL HISTORY: Past Surgical History:  Procedure Laterality Date  . APPLICATION OF CRANIAL NAVIGATION N/A 12/10/2016   Procedure: APPLICATION OF CRANIAL NAVIGATION;  Surgeon: Consuella Lose, MD;  Location: [facility];  Service: Neurosurgery;  Laterality: N/A;  . BRAIN SURGERY    . BREAST SURGERY  12/30/11   breast reduction  . CESAREAN SECTION  09/09/2010   [place]  . CESAREAN SECTION N/A 01/13/2015   Procedure: CESAREAN SECTION;  Surgeon: Janyth Pupa, DO;  Location: [facility];  Service: Obstetrics;  Laterality: N/A;  EDD 01/18/15 would like [place] dressing  . CHOLECYSTECTOMY N/A 11/23/2016   Procedure: LAPAROSCOPIC CHOLECYSTECTOMY WITH INTRAOPERATIVE CHOLANGIOGRAM;  Surgeon: Donnie Mesa, MD;  Location: [facility];  Service: General;  Laterality: N/A;  . CRANIOTOMY N/A 12/10/2016   Procedure: BICORONAL CRANIOTOMY FOR RESECTION OF TUMOR;  Surgeon: Consuella Lose, MD;  Location: [facility];  Service: Neurosurgery;  Laterality: N/A;  BICORONAL CRANIOTOMY FOR RESECTION OF TUMOR   . LYMPH NODE DISSECTION  2013   Abdominal    FAMILY HISTORY: Family History  Problem Relation Age of Onset  . Kidney disease Mother        kidney stones  . Cancer Maternal Grandmother        breast  . Cancer Maternal Grandfather        Colon  . Deep vein thrombosis Maternal Grandfather        in throat  . Cancer Maternal Aunt        breast  . Heart disease Maternal Uncle   . Stroke Maternal Uncle   . Liver cancer Maternal Uncle   . Diabetes Paternal Grandfather     SOCIAL  HISTORY: Social History   Socioeconomic History  . Marital status: Single    Spouse name: Not on file  . Number of children: 2  . Years of education: 30  . Highest education level: Not on file  Occupational History  . Occupation: [employer]  Social Needs  . Financial resource strain: Not on file  . Food insecurity    Worry: Not on file    Inability: Not on file  . Transportation needs    Medical: Not on file    Non-medical: Not on file  Tobacco Use  . Smoking status: Never Smoker  . Smokeless tobacco: Never Used  Substance and Sexual Activity  . Alcohol use: No  . Drug use: No  . Sexual activity: Yes  Partners: Male    Birth control/protection: I.U.D.  Lifestyle  . Physical activity    Days per week: Not on file    Minutes per session: Not on file  . Stress: Not on file  Relationships  . Social Herbalist on phone: Not on file    Gets together: Not on file    Attends religious service: Not on file    Active member of club or organization: Not on file    Attends meetings of clubs or organizations: Not on file    Relationship status: Not on file  . Intimate partner violence    Fear of current or ex partner: Not on file    Emotionally abused: Not on file    Physically abused: Not on file    Forced sexual activity: Not on file  Other Topics Concern  . Not on file  Social History Narrative   Lives at home w/ her 2 children   Right-handed   Caffeine: couple of cups per month      PHYSICAL EXAM Generalized: Well developed, in no acute distress   Neurological examination  Mentation: Alert oriented to time, place, history taking. Follows all commands speech and language fluent Cranial nerve II-XII:Extraocular movements were full. Facial symmetry noted. uvula tongue midline. Head turning and shoulder shrug  were normal and symmetric. Motor: Good strength throughout subjectively per patient Sensory: Sensory testing is intact to soft touch on all 4 extremities  subjectively per patient Coordination: Cerebellar testing reveals good finger-nose-finger  Gait and station: Patient is able to stand from a seated position. gait is normal.  Reflexes: UTA  DIAGNOSTIC DATA (LABS, IMAGING, TESTING) - I reviewed patient records, labs, notes, testing and imaging myself where available.  Lab Results  Component Value Date   WBC 6.9 09/05/2018   HGB 12.9 09/05/2018   HCT 41.1 09/05/2018   MCV 94.9 09/05/2018   PLT 421 (H) 09/05/2018      Component Value Date/Time   NA 138 09/05/2018 2024   NA 138 07/26/2018 0925   K 3.4 (L) 09/05/2018 2024   CL 106 09/05/2018 2024   CO2 25 09/05/2018 2024   GLUCOSE 96 09/05/2018 2024   BUN 13 09/05/2018 2024   BUN 12 07/26/2018 0925   CREATININE 0.73 09/05/2018 2024   CALCIUM 8.7 (L) 09/05/2018 2024   PROT 7.1 07/26/2018 0925   ALBUMIN 4.2 07/26/2018 0925   AST 11 07/26/2018 0925   ALT 10 07/26/2018 0925   ALKPHOS 95 07/26/2018 0925   BILITOT 0.5 07/26/2018 0925   GFRNONAA >60 09/05/2018 2024   GFRAA >60 09/05/2018 2024   No results found for: CHOL, HDL, LDLCALC, LDLDIRECT, TRIG, CHOLHDL No results found for: HGBA1C No results found for: VITAMINB12 No results found for: TSH    ASSESSMENT AND PLAN 33 y.o. year old female  has a past medical history of Asthma, Convulsions/seizures ([place]) (07/27/2016), H/O: C-section, Headache, Lipoma, Lipoma of LUQ abdomen, s/p removal 06/27/2012 (05/24/2012), and Meningioma ([place]) (07/27/2016). here with:  1.  Seizures  The patient increased her Lamictal on her own.  However she has tolerated the increase.  She will remain on Lamictal 200 mg twice a day.  I will check blood work to ensure her Lamictal level is in therapeutic range.  The patient is advised that if she has any additional seizure events she should let us know.  She will follow-up in 6 months or sooner if needed.   I spent 15 minutes with  the patient. 50% of this time was spent discussing her seizure  medications.   Ward Givens, MSN, NP-C 03/06/2019, 2:29 PM [facility] [address] [place], [place] [address] [phone]

## 2019-03-17 ENCOUNTER — Other Ambulatory Visit: Payer: Self-pay

## 2019-03-17 ENCOUNTER — Encounter (HOSPITAL_COMMUNITY): Payer: Self-pay | Admitting: Emergency Medicine

## 2019-03-17 ENCOUNTER — Emergency Department (HOSPITAL_COMMUNITY)
Admission: EM | Admit: 2019-03-17 | Discharge: 2019-03-17 | Disposition: A | Discharge status: Home / Self Care | Payer: Medicaid Other | Attending: Emergency Medicine | Admitting: Emergency Medicine

## 2019-03-17 DIAGNOSIS — R569 Unspecified convulsions: Secondary | ICD-10-CM

## 2019-03-17 DIAGNOSIS — Z91010 Allergy to peanuts: Secondary | ICD-10-CM | POA: Insufficient documentation

## 2019-03-17 DIAGNOSIS — Z3202 Encounter for pregnancy test, result negative: Secondary | ICD-10-CM | POA: Diagnosis not present

## 2019-03-17 DIAGNOSIS — J45909 Unspecified asthma, uncomplicated: Secondary | ICD-10-CM | POA: Diagnosis not present

## 2019-03-17 LAB — CBG MONITORING, ED: Glucose-Capillary: 91 mg/dL (ref 70–99)

## 2019-03-17 LAB — PREGNANCY, URINE: Preg Test, Ur: NEGATIVE

## 2019-03-17 NOTE — ED Provider Notes (Signed)
[facility] DEPT Provider Note   CSN: [id number] Arrival date & time: 03/17/19  1914     History   Chief Complaint Chief Complaint  Patient presents with  . Seizures  . Cough    HPI Kendra [name] is a 33 y.o. female.     HPI Patient is a 33 year old female with a history of epilepsy who is on Lamictal.  She reports a missed dose of Lamictal last night.  She reportedly had a witnessed grand mal seizure by family.  Her last seizure was in December 2019.  She denies headache or neck pain at this time.  She reports bite to the side of her tongue.  She denies fevers and chills.  No chest pain or abdominal pain.  Denies nausea vomiting or diarrhea.  No other recent change in her medications.   Past Medical History:  Diagnosis Date  . Asthma   . Convulsions/seizures ([place]) 07/27/2016   most recent 10/17/17  . H/O: C-section   . Headache   . Lipoma   . Lipoma of LUQ abdomen, s/p removal 06/27/2012 05/24/2012  . Meningioma ([place]) 07/27/2016   Anterior fossa    Patient Active Problem List   Diagnosis Date Noted  . Convulsions/seizures ([place]) 07/27/2016  . Meningioma ([place]) 07/27/2016  . Common migraine with intractable migraine 07/27/2016  . Status post repeat low transverse cesarean section 01/14/2015  . Lipoma of LUQ abdomen, s/p removal 06/27/2012 05/24/2012  . 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 Chlamydia infection 12/13/2011  . 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 APPLICATION OF CRANIAL NAVIGATION N/A 12/10/2016   Procedure: APPLICATION OF CRANIAL NAVIGATION;  Surgeon: Consuella Lose, MD;  Location: [facility];  Service: Neurosurgery;  Laterality: N/A;  . BRAIN SURGERY    . BREAST SURGERY  12/30/11   breast reduction  . CESAREAN SECTION  09/09/2010   [place]  . CESAREAN SECTION N/A 01/13/2015   Procedure: CESAREAN SECTION;  Surgeon: Janyth Pupa, DO;  Location:  [facility];  Service: Obstetrics;  Laterality: N/A;  EDD 01/18/15 would like [place]ico dressing  . CHOLECYSTECTOMY N/A 11/23/2016   Procedure: LAPAROSCOPIC CHOLECYSTECTOMY WITH INTRAOPERATIVE CHOLANGIOGRAM;  Surgeon: Donnie Mesa, MD;  Location: [facility];  Service: General;  Laterality: N/A;  . CRANIOTOMY N/A 12/10/2016   Procedure: BICORONAL CRANIOTOMY FOR RESECTION OF TUMOR;  Surgeon: Consuella Lose, MD;  Location: [facility];  Service: Neurosurgery;  Laterality: N/A;  BICORONAL CRANIOTOMY FOR RESECTION OF TUMOR   . LYMPH NODE DISSECTION  2013   Abdominal     OB History    Gravida  2   Para  2   Term  2   Preterm      AB      Living  2     SAB      TAB      Ectopic      Multiple  0   Live Births  1            Home Medications    Prior to Admission medications   Medication Sig Start Date End Date Taking? Authorizing Provider  acetaminophen (TYLENOL) 500 MG tablet Take 1,000 mg by mouth every 6 (six) hours as needed (for pain/headaches.).    [provider]  albuterol (PROVENTIL HFA;VENTOLIN HFA) 108 (90 BASE) MCG/ACT inhaler Inhale 2 puffs into the lungs every 6 (six) hours as needed for wheezing or shortness of breath.  [provider]  benzonatate (TESSALON) 100 MG capsule Take 1 capsule (100 mg total) by mouth every 8 (eight) hours. Patient not taking: Reported on 09/05/2018 09/30/17   Ok Edwards, PA-C  cetirizine-pseudoephedrine (ZYRTEC-D) 5-120 MG tablet Take 1 tablet by mouth daily. Patient not taking: Reported on 03/17/2019 09/30/17   Ok Edwards, PA-C  diphenhydrAMINE (BENADRYL) 25 mg capsule Take 1 capsule (25 mg total) by mouth every 6 (six) hours as needed. Patient taking differently: Take 25 mg by mouth every 6 (six) hours as needed for allergies.  07/17/16   Mackuen, Courteney Lyn, MD  gabapentin (NEURONTIN) 100 MG capsule Take 1 capsule (100 mg total) by mouth 3 (three) times daily. Patient not taking: Reported on 09/05/2018 08/01/17   Ward Givens, NP   lamoTRIgine (LAMICTAL) 100 MG tablet Take 2 tablets (200 mg total) by mouth 2 (two) times daily. 03/06/19   Ward Givens, NP  levonorgestrel (MIRENA) 20 MCG/24HR IUD 1 each by Intrauterine route once.    [provider]  LINZESS 290 MCG CAPS capsule Take 290 mcg by mouth daily as needed (constipation).  07/16/16   [provider]  pantoprazole (PROTONIX) 20 MG tablet Take 1 tablet (20 mg total) by mouth daily. Patient not taking: Reported on 09/05/2018 10/05/17   Robinson, [place] N, PA-C    Family History Family History  Problem Relation Age of Onset  . Kidney disease Mother        kidney stones  . Cancer Maternal Grandmother        breast  . Cancer Maternal Grandfather        Colon  . Deep vein thrombosis Maternal Grandfather        in throat  . Cancer Maternal Aunt        breast  . Heart disease Maternal Uncle   . Stroke Maternal Uncle   . Liver cancer Maternal Uncle   . Diabetes Paternal Grandfather     Social History Social History   Tobacco Use  . Smoking status: Never Smoker  . Smokeless tobacco: Never Used  Substance Use Topics  . Alcohol use: No  . Drug use: No     Allergies   Asa buff (mag [buffered aspirin], Erythromycin, Iodine, Peanut-containing drug products, Shellfish allergy, and Ibuprofen   Review of Systems Review of Systems  All other systems reviewed and are negative.    Physical Exam Updated Vital Signs BP 123/66 (BP Location: Right Arm)   Pulse 98   Temp 98.3 F (36.8 C) (Oral)   Resp (!) 23   Ht 5\' 8"  (1.727 m)   Wt 108.9 kg   SpO2 97%   BMI 36.49 kg/m   Physical Exam Vitals signs and nursing note reviewed.  Constitutional:      General: She is not in acute distress.    Appearance: She is well-developed.  HENT:     Head: Normocephalic and atraumatic.  Eyes:     Pupils: Pupils are equal, round, and reactive to light.  Neck:     Musculoskeletal: Normal range of motion.  Cardiovascular:     Rate and Rhythm:  Normal rate and regular rhythm.     Heart sounds: Normal heart sounds.  Pulmonary:     Effort: Pulmonary effort is normal.     Breath sounds: Normal breath sounds.  Abdominal:     General: There is no distension.     Palpations: Abdomen is soft.     Tenderness: There is no abdominal tenderness.  Musculoskeletal: Normal range of motion.  Skin:    General: Skin is warm and dry.  Neurological:     Mental Status: She is alert and oriented to person, place, and time.     Comments: 5/5 strength in major muscle groups of  bilateral upper and lower extremities. Speech normal. No facial asymetry.   Psychiatric:        Judgment: Judgment normal.      ED Treatments / Results  Labs (all labs ordered are listed, but only abnormal results are displayed) Labs Reviewed  PREGNANCY, URINE  CBG MONITORING, ED    EKG None  Radiology No results found.  Procedures Procedures (including critical care time)  Medications Ordered in ED Medications - No data to display   Initial Impression / Assessment and Plan / ED Course  I have reviewed the triage vital signs and the nursing notes.  Pertinent labs & imaging results that were available during my care of the patient were reviewed by me and considered in my medical decision making (see chart for details).        Observed in the emergency department.  No recurrent seizures.  Likely secondary to noncompliance and missed dose of her Lamictal last night.  Discharged home in good condition.  She has had her Lamictal today.  Final Clinical Impressions(s) / ED Diagnoses   Final diagnoses:  Seizure [facility])    ED Discharge Orders    None       Jola Schmidt, MD 03/17/19 2119

## 2019-03-17 NOTE — ED Triage Notes (Addendum)
Patient has a hx of seizures. Patient was sitting in a chair and had a witness grand mal seizure. Patients last seizure was October 2019. Patient did not have any injuries. Patient is complaining of cough and scratchy throat x 4 days.

## 2019-03-18 ENCOUNTER — Encounter: Payer: Self-pay | Admitting: Adult Health

## 2019-06-10 ENCOUNTER — Emergency Department (HOSPITAL_COMMUNITY): Payer: Medicaid Other

## 2019-06-10 ENCOUNTER — Encounter (HOSPITAL_COMMUNITY): Payer: Self-pay

## 2019-06-10 ENCOUNTER — Emergency Department (HOSPITAL_COMMUNITY)
Admission: EM | Admit: 2019-06-10 | Discharge: 2019-06-11 | Disposition: A | Discharge status: Home / Self Care | Payer: Medicaid Other | Attending: Emergency Medicine | Admitting: Emergency Medicine

## 2019-06-10 ENCOUNTER — Other Ambulatory Visit: Payer: Self-pay

## 2019-06-10 DIAGNOSIS — T148XXA Other injury of unspecified body region, initial encounter: Secondary | ICD-10-CM | POA: Diagnosis not present

## 2019-06-10 DIAGNOSIS — R569 Unspecified convulsions: Secondary | ICD-10-CM | POA: Insufficient documentation

## 2019-06-10 DIAGNOSIS — Y9389 Activity, other specified: Secondary | ICD-10-CM | POA: Insufficient documentation

## 2019-06-10 DIAGNOSIS — Y92410 Unspecified street and highway as the place of occurrence of the external cause: Secondary | ICD-10-CM | POA: Diagnosis not present

## 2019-06-10 DIAGNOSIS — Y999 Unspecified external cause status: Secondary | ICD-10-CM | POA: Insufficient documentation

## 2019-06-10 DIAGNOSIS — R079 Chest pain, unspecified: Secondary | ICD-10-CM | POA: Diagnosis not present

## 2019-06-10 DIAGNOSIS — T07XXXA Unspecified multiple injuries, initial encounter: Secondary | ICD-10-CM

## 2019-06-10 DIAGNOSIS — R519 Headache, unspecified: Secondary | ICD-10-CM | POA: Diagnosis not present

## 2019-06-10 MED ORDER — ACETAMINOPHEN 325 MG PO TABS
650.0000 mg | ORAL_TABLET | Freq: Once | ORAL | Status: AC
  Administered 2019-06-11: 650 mg via ORAL
  Filled 2019-06-10: qty 2

## 2019-06-10 NOTE — ED Notes (Signed)
Patient ambulated to X-ray 
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
## 2019-09-19 ENCOUNTER — Other Ambulatory Visit: Payer: Self-pay

## 2019-09-19 ENCOUNTER — Ambulatory Visit: Payer: Medicaid Other | Admitting: Adult Health

## 2019-09-19 ENCOUNTER — Encounter: Payer: Self-pay | Admitting: Adult Health

## 2019-09-19 VITALS — BP 128/78 | HR 88 | Temp 97.4°F | Ht 60.0 in | Wt 251.2 lb

## 2019-09-19 DIAGNOSIS — Z5181 Encounter for therapeutic drug level monitoring: Secondary | ICD-10-CM

## 2019-09-19 DIAGNOSIS — R569 Unspecified convulsions: Secondary | ICD-10-CM | POA: Diagnosis not present

## 2019-09-19 NOTE — Patient Instructions (Signed)
Continue Lamictal °Blood work today °If you have any seizure events please let us know.  ° ° °

## 2019-09-19 NOTE — Progress Notes (Signed)
I have read the note, and I agree with the clinical assessment and plan.  Summerlynn Glauser K Gabbi Whetstone   

## 2019-09-19 NOTE — Progress Notes (Signed)
PATIENT: Kendra [name] DOB: 11/02/1985  REASON FOR VISIT: follow up HISTORY FROM: patient  HISTORY OF PRESENT ILLNESS: Today 09/19/19:  Kendra [name] is a 34 year old female with a history of seizures.  She returns today for follow-up.  She is currently on Lamictal 200 mg twice a day.  She reports that she has been tolerating this well.  According to epic she had a seizure back in July after our office visit.  The patient does not completely remember this event.  She does note that she may have missed medication around that time.  She states that when she was working she would often miss a dose if she had to work that day.  She reports that she is able to complete all ADLs independently.  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  She states that since she has been out of work she has been more diligent about taking her medication.  She reports that she is trying to get disability for her seizures and asthma.  HISTORY 03/06/19 : Kendra [name] is a 34 year old female with a history of seizures.  She returns today for follow-up.  She reports that she was placed on Keppra in December after having a seizure however she has discontinued this medication.  She states that she does not tolerate Keppra well.  In the past she had anger outburst while on Keppra.  She reports that she is taking Lamictal 200 mg twice a day.  She states that she has been tolerating this well.  Denies any changes in her gait or balance.  Denies any additional seizure events.  She is operating a motor vehicle.  She joins me today for virtual visit.  REVIEW OF SYSTEMS: Out of a complete 14 system review of symptoms, the patient complains only of the following symptoms, and all other reviewed systems are negative.  See HPI  ALLERGIES: Allergies  Allergen Reactions  . Asa Buff (Mag [Buffered Aspirin] Anaphylaxis  . Erythromycin Anaphylaxis  . Iodine Anaphylaxis and Hives  . Peanut-Containing  Drug Products Anaphylaxis and Hives    Pt is allergic to peanut oil  . Shellfish Allergy Anaphylaxis, Hives, Swelling and Other (See Comments)    Pt throat swells  . Ibuprofen Rash    HOME MEDICATIONS: Outpatient Medications Prior to Visit  Medication Sig Dispense Refill  . acetaminophen (TYLENOL) 500 MG tablet Take 1,000 mg by mouth every 6 (six) hours as needed (for pain/headaches.).    Marland Kitchen albuterol (PROVENTIL HFA;VENTOLIN HFA) 108 (90 BASE) MCG/ACT inhaler Inhale 2 puffs into the lungs every 6 (six) hours as needed for wheezing or shortness of breath.    . budesonide-formoterol (SYMBICORT) 160-4.5 MCG/ACT inhaler Inhale 2 puffs into the lungs 2 (two) times daily.    . cetirizine-pseudoephedrine (ZYRTEC-D) 5-120 MG tablet Take 1 tablet by mouth daily. (Patient taking differently: Take 1 tablet by mouth as needed. ) 15 tablet 0  . diphenhydrAMINE (BENADRYL) 25 mg capsule Take 1 capsule (25 mg total) by mouth every 6 (six) hours as needed. (Patient taking differently: Take 25 mg by mouth every 6 (six) hours as needed for allergies. ) 30 capsule 0  . EPINEPHrine 0.3 mg/0.3 mL IJ SOAJ injection Inject 0.3 mg into the skin once.    . lamoTRIgine (LAMICTAL) 100 MG tablet Take 2 tablets (200 mg total) by mouth 2 (two) times daily. 120 tablet 5  . levonorgestrel (MIRENA) 20 MCG/24HR IUD 1 each by Intrauterine route once.    Marland Kitchen  LINZESS 290 MCG CAPS capsule Take 290 mcg by mouth daily as needed (constipation).   3  . predniSONE (DELTASONE) 5 MG tablet Take 5 mg by mouth daily.    . benzonatate (TESSALON) 100 MG capsule Take 1 capsule (100 mg total) by mouth every 8 (eight) hours. (Patient not taking: Reported on 09/05/2018) 21 capsule 0  . gabapentin (NEURONTIN) 100 MG capsule Take 1 capsule (100 mg total) by mouth 3 (three) times daily. (Patient not taking: Reported on 09/05/2018) 90 capsule 3  . pantoprazole (PROTONIX) 20 MG tablet Take 1 tablet (20 mg total) by mouth daily. (Patient not taking: Reported  on 09/05/2018) 14 tablet 0   No facility-administered medications prior to visit.    PAST MEDICAL HISTORY: Past Medical History:  Diagnosis Date  . Asthma   . Convulsions/seizures ([place]) 07/27/2016   most recent 10/17/17  . H/O: C-section   . Headache   . Lipoma   . Lipoma of LUQ abdomen, s/p removal 06/27/2012 05/24/2012  . Meningioma ([place]) 07/27/2016   Anterior fossa    PAST SURGICAL HISTORY: Past Surgical History:  Procedure Laterality Date  . APPLICATION OF CRANIAL NAVIGATION N/A 12/10/2016   Procedure: APPLICATION OF CRANIAL NAVIGATION;  Surgeon: Consuella Lose, MD;  Location: [facility];  Service: Neurosurgery;  Laterality: N/A;  . BRAIN SURGERY    . BREAST SURGERY  12/30/11   breast reduction  . CESAREAN SECTION  09/09/2010   [place]  . CESAREAN SECTION N/A 01/13/2015   Procedure: CESAREAN SECTION;  Surgeon: Janyth Pupa, DO;  Location: [facility];  Service: Obstetrics;  Laterality: N/A;  EDD 01/18/15 would like [place] dressing  . CHOLECYSTECTOMY N/A 11/23/2016   Procedure: LAPAROSCOPIC CHOLECYSTECTOMY WITH INTRAOPERATIVE CHOLANGIOGRAM;  Surgeon: Donnie Mesa, MD;  Location: [facility];  Service: General;  Laterality: N/A;  . CRANIOTOMY N/A 12/10/2016   Procedure: BICORONAL CRANIOTOMY FOR RESECTION OF TUMOR;  Surgeon: Consuella Lose, MD;  Location: [facility];  Service: Neurosurgery;  Laterality: N/A;  BICORONAL CRANIOTOMY FOR RESECTION OF TUMOR   . LYMPH NODE DISSECTION  2013   Abdominal    FAMILY HISTORY: Family History  Problem Relation Age of Onset  . Kidney disease Mother        kidney stones  . Cancer Maternal Grandmother        breast  . Cancer Maternal Grandfather        Colon  . Deep vein thrombosis Maternal Grandfather        in throat  . Cancer Maternal Aunt        breast  . Heart disease Maternal Uncle   . Stroke Maternal Uncle   . Liver cancer Maternal Uncle   . Diabetes Paternal Grandfather     SOCIAL HISTORY: Social History   Socioeconomic History  . Marital  status: Single    Spouse name: Not on file  . Number of children: 2  . Years of education: 27  . Highest education level: Not on file  Occupational History  . Occupation: [employer]  Tobacco Use  . Smoking status: Never Smoker  . Smokeless tobacco: Never Used  Substance and Sexual Activity  . Alcohol use: No  . Drug use: No  . Sexual activity: Yes    Partners: Male    Birth control/protection: I.U.D.  Other Topics Concern  . Not on file  Social History Narrative   Lives at home w/ her 2 children   Right-handed   Caffeine: couple of cups per month   Social Determinants of  Health   Financial Resource Strain:   . Difficulty of Paying Living Expenses: Not on file  Food Insecurity:   . Worried About Charity fundraiser in the Last Year: Not on file  . Ran Out of Food in the Last Year: Not on file  Transportation Needs:   . Lack of Transportation (Medical): Not on file  . Lack of Transportation (Non-Medical): Not on file  Physical Activity:   . Days of Exercise per Week: Not on file  . Minutes of Exercise per Session: Not on file  Stress:   . Feeling of Stress : Not on file  Social Connections:   . Frequency of Communication with Friends and Family: Not on file  . Frequency of Social Gatherings with Friends and Family: Not on file  . Attends Religious Services: Not on file  . Active Member of Clubs or Organizations: Not on file  . Attends Archivist Meetings: Not on file  . Marital Status: Not on file  Intimate Partner Violence:   . Fear of Current or Ex-Partner: Not on file  . Emotionally Abused: Not on file  . Physically Abused: Not on file  . Sexually Abused: Not on file      PHYSICAL EXAM  Vitals:   09/19/19 1329  BP: 128/78  Pulse: 88  Temp: (!) 97.4 F (36.3 C)  TempSrc: Oral  Weight: 251 lb 3.2 oz (113.9 kg)  Height: 5' (1.524 m)   Body mass index is 49.06 kg/m.  Generalized: Well developed, in no acute distress   Neurological examination    Mentation: Alert oriented to time, place, history taking. Follows all commands speech and language fluent Cranial nerve II-XII: Pupils were equal round reactive to light. Extraocular movements were full, visual field were full on confrontational test. . Head turning and shoulder shrug  were normal and symmetric. Motor: The motor testing reveals 5 over 5 strength of all 4 extremities. Good symmetric motor tone is noted throughout.  Sensory: Sensory testing is intact to soft touch on all 4 extremities. No evidence of extinction is noted.  Coordination: Cerebellar testing reveals good finger-nose-finger and heel-to-shin bilaterally.  Gait and station: Gait is normal.  Reflexes: Deep tendon reflexes are symmetric and normal bilaterally.   DIAGNOSTIC DATA (LABS, IMAGING, TESTING) - I reviewed patient records, labs, notes, testing and imaging myself where available.  Lab Results  Component Value Date   WBC 6.9 09/05/2018   HGB 12.9 09/05/2018   HCT 41.1 09/05/2018   MCV 94.9 09/05/2018   PLT 421 (H) 09/05/2018      Component Value Date/Time   NA 138 09/05/2018 2024   NA 138 07/26/2018 0925   K 3.4 (L) 09/05/2018 2024   CL 106 09/05/2018 2024   CO2 25 09/05/2018 2024   GLUCOSE 96 09/05/2018 2024   BUN 13 09/05/2018 2024   BUN 12 07/26/2018 0925   CREATININE 0.73 09/05/2018 2024   CALCIUM 8.7 (L) 09/05/2018 2024   PROT 7.1 07/26/2018 0925   ALBUMIN 4.2 07/26/2018 0925   AST 11 07/26/2018 0925   ALT 10 07/26/2018 0925   ALKPHOS 95 07/26/2018 0925   BILITOT 0.5 07/26/2018 0925   GFRNONAA >60 09/05/2018 2024   GFRAA >60 09/05/2018 2024      ASSESSMENT AND PLAN 34 y.o. year old female  has a past medical history of Asthma, Convulsions/seizures ([place]) (07/27/2016), H/O: C-section, Headache, Lipoma, Lipoma of LUQ abdomen, s/p removal 06/27/2012 (05/24/2012), and Meningioma ([place]) (07/27/2016). here with:  1.  Seizures   Continue Lamictal 200 mg twice a day  Blood work today    Patient advised in order to get full control of her seizures she has to be diligent about taking her medication consistently and not missing any doses.  Discussed seizure precautions  Advised if she has any seizure event she should let us know  Follow-up in 1 year or sooner if needed   I spent 15 minutes with the patient. 50% of this time was spent reviewing plan of care   Ward Givens, MSN, NP-C 09/19/2019, 3:39 PM [facility] [address], [place], [place] [address] [phone]

## 2019-09-20 LAB — LAMOTRIGINE LEVEL: Lamotrigine Lvl: 2.5 ug/mL (ref 2.0–20.0)

## 2019-09-20 LAB — CBC WITH DIFFERENTIAL/PLATELET
Basophils Absolute: 0 10*3/uL (ref 0.0–0.2)
Basos: 0 %
EOS (ABSOLUTE): 0.1 10*3/uL (ref 0.0–0.4)
Eos: 1 %
Hematocrit: 38.3 % (ref 34.0–46.6)
Hemoglobin: 12.9 g/dL (ref 11.1–15.9)
Immature Grans (Abs): 0 10*3/uL (ref 0.0–0.1)
Immature Granulocytes: 0 %
Lymphocytes Absolute: 3.5 10*3/uL — ABNORMAL HIGH (ref 0.7–3.1)
Lymphs: 40 %
MCH: 30.9 pg (ref 26.6–33.0)
MCHC: 33.7 g/dL (ref 31.5–35.7)
MCV: 92 fL (ref 79–97)
Monocytes Absolute: 0.6 10*3/uL (ref 0.1–0.9)
Monocytes: 7 %
Neutrophils Absolute: 4.5 10*3/uL (ref 1.4–7.0)
Neutrophils: 52 %
Platelets: 421 10*3/uL (ref 150–450)
RBC: 4.18 x10E6/uL (ref 3.77–5.28)
RDW: 13.1 % (ref 11.7–15.4)
WBC: 8.8 10*3/uL (ref 3.4–10.8)

## 2019-09-20 LAB — COMPREHENSIVE METABOLIC PANEL
ALT: 9 IU/L (ref 0–32)
AST: 13 IU/L (ref 0–40)
Albumin/Globulin Ratio: 1.4 (ref 1.2–2.2)
Albumin: 3.9 g/dL (ref 3.8–4.8)
Alkaline Phosphatase: 81 IU/L (ref 39–117)
BUN/Creatinine Ratio: 13 (ref 9–23)
BUN: 10 mg/dL (ref 6–20)
Bilirubin Total: 0.2 mg/dL (ref 0.0–1.2)
CO2: 26 mmol/L (ref 20–29)
Calcium: 9.4 mg/dL (ref 8.7–10.2)
Chloride: 101 mmol/L (ref 96–106)
Creatinine, Ser: 0.79 mg/dL (ref 0.57–1.00)
GFR calc Af Amer: 115 mL/min/{1.73_m2} (ref >59)
GFR calc non Af Amer: 100 mL/min/{1.73_m2} (ref >59)
Globulin, Total: 2.7 g/dL (ref 1.5–4.5)
Glucose: 76 mg/dL (ref 65–99)
Potassium: 4.5 mmol/L (ref 3.5–5.2)
Sodium: 138 mmol/L (ref 134–144)
Total Protein: 6.6 g/dL (ref 6.0–8.5)

## 2019-09-24 ENCOUNTER — Telehealth: Payer: Self-pay

## 2019-09-24 NOTE — Telephone Encounter (Signed)
-----   Message from Ward Givens, NP sent at 09/24/2019  9:18 AM EST ----- Labs results are unremarkable. Please call patient with results.

## 2019-09-24 NOTE — Telephone Encounter (Signed)
Unable to get in contact with the patient. LVM letting her know that her lab work came back normal. Office number was provided in case she has any questions or concerns.

## 2019-11-02 ENCOUNTER — Other Ambulatory Visit (HOSPITAL_COMMUNITY): Payer: Self-pay | Admitting: Neurosurgery

## 2019-11-02 DIAGNOSIS — D329 Benign neoplasm of meninges, unspecified: Secondary | ICD-10-CM

## 2020-01-01 ENCOUNTER — Telehealth: Payer: Self-pay | Admitting: Adult Health

## 2020-01-01 NOTE — Telephone Encounter (Signed)
noted 

## 2020-01-01 NOTE — Telephone Encounter (Signed)
Pt called and wanted to know if an MRI can be ordered for her due to all the bad headaches she has been experiencing. Please advise.

## 2020-01-01 NOTE — Telephone Encounter (Signed)
I called pt and she noted that has been having L head sharp pains, (electronic shock pains for the last 2 months, progressive;ly gotten worse.  She takes tylenol.  Looking in past notes, we have seen for headaches, she had meningioma surgery 11-2016.  Dr. Kathyrn Sheriff has placed order in system for pt, she will call there office and let them know whats going on and then will let us know if we are needed.  FYI

## 2020-01-15 ENCOUNTER — Other Ambulatory Visit: Payer: Self-pay

## 2020-01-15 ENCOUNTER — Ambulatory Visit (HOSPITAL_COMMUNITY)
Admission: RE | Admit: 2020-01-15 | Discharge: 2020-01-15 | Disposition: A | Discharge status: Home / Self Care | Payer: Medicaid Other | Source: Ambulatory Visit | Attending: Neurosurgery | Admitting: Neurosurgery

## 2020-01-15 DIAGNOSIS — Z0289 Encounter for other administrative examinations: Secondary | ICD-10-CM

## 2020-01-15 DIAGNOSIS — D329 Benign neoplasm of meninges, unspecified: Secondary | ICD-10-CM | POA: Insufficient documentation

## 2020-01-15 MED ORDER — GADOBUTROL 1 MMOL/ML IV SOLN
10.0000 mL | Freq: Once | INTRAVENOUS | Status: AC | PRN
  Administered 2020-01-15: 10 mL via INTRAVENOUS

## 2020-01-30 ENCOUNTER — Telehealth: Payer: Self-pay | Admitting: Adult Health

## 2020-01-30 NOTE — Telephone Encounter (Signed)
We have seen her before for migraines, then last seen seizures.  Had MRI for meningioma.  Do you want [place] to see her again prior to dispense medication?

## 2020-01-30 NOTE — Telephone Encounter (Signed)
Pt called to advise that she recently had an [facility] done at [facility] and would like to know if she can be prescribed a medication for migraines states she has not been able to function she is unsure if she needs a referral to a pain specialist or what she needs to do

## 2020-01-30 NOTE — Telephone Encounter (Signed)
Yes I will need to see her

## 2020-01-31 NOTE — Telephone Encounter (Signed)
I spoke with the pt. I scheduled her for this coming Monday 6/7 @ 11:00 AM arrival 15-30 minutes early. Pt verbalized appreciation.

## 2020-02-04 ENCOUNTER — Ambulatory Visit: Payer: Medicaid Other | Admitting: Adult Health

## 2020-02-04 ENCOUNTER — Encounter: Payer: Self-pay | Admitting: Adult Health

## 2020-02-04 ENCOUNTER — Other Ambulatory Visit: Payer: Self-pay

## 2020-02-04 VITALS — BP 134/70 | HR 81 | Ht 60.0 in | Wt 259.0 lb

## 2020-02-04 DIAGNOSIS — R413 Other amnesia: Secondary | ICD-10-CM

## 2020-02-04 DIAGNOSIS — R569 Unspecified convulsions: Secondary | ICD-10-CM

## 2020-02-04 DIAGNOSIS — R519 Headache, unspecified: Secondary | ICD-10-CM

## 2020-02-04 MED ORDER — GABAPENTIN 100 MG PO CAPS: 100.0000 mg | ORAL_CAPSULE | Freq: Every day | ORAL | 3 refills | Status: DC

## 2020-02-04 NOTE — Patient Instructions (Signed)
Your Plan:  Continue Lamictal  Start Gabapentin at bedtime  Memory test today If your symptoms worsen or you develop new symptoms please let us know.    Thank you for coming to see [place] at [facility]. I hope we have been able to provide you high quality care today.  You may receive a patient satisfaction survey over the next few weeks. We would appreciate your feedback and comments so that we may continue to improve ourselves and the health of our patients.

## 2020-02-04 NOTE — Progress Notes (Signed)
PATIENT: Kendra [name] DOB: 09/24/1985  REASON FOR VISIT: follow up HISTORY FROM: patient  HISTORY OF PRESENT ILLNESS: Today 02/04/20:  Kendra [name] is a 34 year old female with a history of seizures.  She returns today for follow-up.  Reports that she has not had any seizure events.  She continues to do well on Lamictal.  She does report that she has been having headache ongoing for the last 6 months.  She feels that it is slowly getting worse.  She states that it occurs on the left side where she previously had surgery.  States that it radiates down to the back of the neck.  She reports photophobia but denies photophobia.  She states that the discomfort is always there.  Worse at times.  She also reports feeling more fatigued.  She states that she is also noticed some forgetfulness within the last 2 weeks.  She did have a repeat MRI of the brain by neurosurgery.  Scan was stable.  HISTORY 09/19/19:  Kendra [name] is a 34 year old female with a history of seizures.  She returns today for follow-up.  She is currently on Lamictal 200 mg twice a day.  She reports that she has been tolerating this well.  According to epic she had a seizure back in July after our office visit.  The patient does not completely remember this event.  She does note that she may have missed medication around that time.  She states that when she was working she would often miss a dose if she had to work that day.  She reports that she is able to complete all ADLs independently.  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  She states that since she has been out of work she has been more diligent about taking her medication.  She reports that she is trying to get disability for her seizures and asthma.  REVIEW OF SYSTEMS: Out of a complete 14 system review of symptoms, the patient complains only of the following symptoms, and all other reviewed systems are negative.  See  HPI  ALLERGIES: Allergies  Allergen Reactions  . Asa Buff (Mag [Buffered Aspirin] Anaphylaxis  . Erythromycin Anaphylaxis  . Iodine Anaphylaxis and Hives  . Peanut-Containing Drug Products Anaphylaxis and Hives    Pt is allergic to peanut oil  . Shellfish Allergy Anaphylaxis, Hives, Swelling and Other (See Comments)    Pt throat swells  . Ibuprofen Rash    HOME MEDICATIONS: Outpatient Medications Prior to Visit  Medication Sig Dispense Refill  . acetaminophen (TYLENOL) 500 MG tablet Take 1,000 mg by mouth every 6 (six) hours as needed (for pain/headaches.).    Marland Kitchen albuterol (PROVENTIL HFA;VENTOLIN HFA) 108 (90 BASE) MCG/ACT inhaler Inhale 2 puffs into the lungs every 6 (six) hours as needed for wheezing or shortness of breath.    . benzonatate (TESSALON) 100 MG capsule Take 1 capsule (100 mg total) by mouth every 8 (eight) hours. 21 capsule 0  . budesonide-formoterol (SYMBICORT) 160-4.5 MCG/ACT inhaler Inhale 2 puffs into the lungs 2 (two) times daily.    . cetirizine-pseudoephedrine (ZYRTEC-D) 5-120 MG tablet Take 1 tablet by mouth daily. (Patient taking differently: Take 1 tablet by mouth as needed. ) 15 tablet 0  . diphenhydrAMINE (BENADRYL) 25 mg capsule Take 1 capsule (25 mg total) by mouth every 6 (six) hours as needed. (Patient taking differently: Take 25 mg by mouth every 6 (six) hours as needed for allergies. ) 30  capsule 0  . EPINEPHrine 0.3 mg/0.3 mL IJ SOAJ injection Inject 0.3 mg into the skin once.    . gabapentin (NEURONTIN) 100 MG capsule Take 1 capsule (100 mg total) by mouth 3 (three) times daily. 90 capsule 3  . lamoTRIgine (LAMICTAL) 100 MG tablet Take 2 tablets (200 mg total) by mouth 2 (two) times daily. 120 tablet 5  . levonorgestrel (MIRENA) 20 MCG/24HR IUD 1 each by Intrauterine route once.    Marland Kitchen LINZESS 290 MCG CAPS capsule Take 290 mcg by mouth daily as needed (constipation).   3  . pantoprazole (PROTONIX) 20 MG tablet Take 1 tablet (20 mg total) by mouth daily.  14 tablet 0  . predniSONE (DELTASONE) 5 MG tablet Take 5 mg by mouth daily.     No facility-administered medications prior to visit.    PAST MEDICAL HISTORY: Past Medical History:  Diagnosis Date  . Asthma   . Convulsions/seizures ([place]) 07/27/2016   most recent 10/17/17  . H/O: C-section   . Headache   . Lipoma   . Lipoma of LUQ abdomen, s/p removal 06/27/2012 05/24/2012  . Meningioma ([place]) 07/27/2016   Anterior fossa    PAST SURGICAL HISTORY: Past Surgical History:  Procedure Laterality Date  . APPLICATION OF CRANIAL NAVIGATION N/A 12/10/2016   Procedure: APPLICATION OF CRANIAL NAVIGATION;  Surgeon: Consuella Lose, MD;  Location: [place];  Service: Neurosurgery;  Laterality: N/A;  . BRAIN SURGERY    . BREAST SURGERY  12/30/11   breast reduction  . CESAREAN SECTION  09/09/2010   [place]  . CESAREAN SECTION N/A 01/13/2015   Procedure: CESAREAN SECTION;  Surgeon: Janyth Pupa, DO;  Location: [facility];  Service: Obstetrics;  Laterality: N/A;  EDD 01/18/15 would like [place] dressing  . CHOLECYSTECTOMY N/A 11/23/2016   Procedure: LAPAROSCOPIC CHOLECYSTECTOMY WITH INTRAOPERATIVE CHOLANGIOGRAM;  Surgeon: Donnie Mesa, MD;  Location: [facility];  Service: General;  Laterality: N/A;  . CRANIOTOMY N/A 12/10/2016   Procedure: BICORONAL CRANIOTOMY FOR RESECTION OF TUMOR;  Surgeon: Consuella Lose, MD;  Location: [place];  Service: Neurosurgery;  Laterality: N/A;  BICORONAL CRANIOTOMY FOR RESECTION OF TUMOR   . LYMPH NODE DISSECTION  2013   Abdominal    FAMILY HISTORY: Family History  Problem Relation Age of Onset  . Kidney disease Mother        kidney stones  . Cancer Maternal Grandmother        breast  . Cancer Maternal Grandfather        Colon  . Deep vein thrombosis Maternal Grandfather        in throat  . Cancer Maternal Aunt        breast  . Heart disease Maternal Uncle   . Stroke Maternal Uncle   . Liver cancer Maternal Uncle   . Diabetes Paternal Grandfather     SOCIAL  HISTORY: Social History   Socioeconomic History  . Marital status: Single    Spouse name: Not on file  . Number of children: 2  . Years of education: 62  . Highest education level: Not on file  Occupational History  . Occupation: [employer]  Tobacco Use  . Smoking status: Never Smoker  . Smokeless tobacco: Never Used  Substance and Sexual Activity  . Alcohol use: No  . Drug use: No  . Sexual activity: Yes    Partners: Male    Birth control/protection: I.U.D.  Other Topics Concern  . Not on file  Social History Narrative   Lives at home w/  her 2 children   Right-handed   Caffeine: couple of cups per month   Social Determinants of Health   Financial Resource Strain:   . Difficulty of Paying Living Expenses:   Food Insecurity:   . Worried About Charity fundraiser in the Last Year:   . Arboriculturist in the Last Year:   Transportation Needs:   . Film/video editor (Medical):   Marland Kitchen Lack of Transportation (Non-Medical):   Physical Activity:   . Days of Exercise per Week:   . Minutes of Exercise per Session:   Stress:   . Feeling of Stress :   Social Connections:   . Frequency of Communication with Friends and Family:   . Frequency of Social Gatherings with Friends and Family:   . Attends Religious Services:   . Active Member of Clubs or Organizations:   . Attends Archivist Meetings:   Marland Kitchen Marital Status:   Intimate Partner Violence:   . Fear of Current or Ex-Partner:   . Emotionally Abused:   Marland Kitchen Physically Abused:   . Sexually Abused:       PHYSICAL EXAM  Vitals:   02/04/20 1042  BP: 134/70  Pulse: 81  Weight: 259 lb (117.5 kg)  Height: 5' (1.524 m)   Body mass index is 50.58 kg/m.  MMSE - Mini Mental State Exam 02/04/2020  Orientation to time 4  Orientation to Place 5  Registration 3  Attention/ Calculation 3  Recall 1  Language- name 2 objects 2  Language- repeat 1  Language- follow 3 step command 3  Language- read & follow direction 1   Write a sentence 1  Copy design 1  Total score 25     Generalized: Well developed, in no acute distress   Neurological examination  Mentation: Alert oriented to time, place, history taking. Follows all commands speech and language fluent Cranial nerve II-XII: Pupils were equal round reactive to light. Extraocular movements were full, visual field were full on confrontational test. Facial sensation and strength were normal. Uvula tongue midline. Head turning and shoulder shrug  were normal and symmetric. Motor: The motor testing reveals 5 over 5 strength of all 4 extremities. Good symmetric motor tone is noted throughout.  Sensory: Sensory testing is intact to soft touch on all 4 extremities. No evidence of extinction is noted.  Coordination: Cerebellar testing reveals good finger-nose-finger and heel-to-shin bilaterally.  Gait and station: Gait is normal.  Reflexes: Deep tendon reflexes are symmetric and normal bilaterally.   DIAGNOSTIC DATA (LABS, IMAGING, TESTING) - I reviewed patient records, labs, notes, testing and imaging myself where available.  Lab Results  Component Value Date   WBC 8.8 09/19/2019   HGB 12.9 09/19/2019   HCT 38.3 09/19/2019   MCV 92 09/19/2019   PLT 421 09/19/2019      Component Value Date/Time   NA 138 09/19/2019 1407   K 4.5 09/19/2019 1407   CL 101 09/19/2019 1407   CO2 26 09/19/2019 1407   GLUCOSE 76 09/19/2019 1407   GLUCOSE 96 09/05/2018 2024   BUN 10 09/19/2019 1407   CREATININE 0.79 09/19/2019 1407   CALCIUM 9.4 09/19/2019 1407   PROT 6.6 09/19/2019 1407   ALBUMIN 3.9 09/19/2019 1407   AST 13 09/19/2019 1407   ALT 9 09/19/2019 1407   ALKPHOS 81 09/19/2019 1407   BILITOT 0.2 09/19/2019 1407   GFRNONAA 100 09/19/2019 1407   GFRAA 115 09/19/2019 1407      ASSESSMENT  AND PLAN 34 y.o. year old female  has a past medical history of Asthma, Convulsions/seizures ([place]) (07/27/2016), H/O: C-section, Headache, Lipoma, Lipoma of LUQ abdomen,  s/p removal 06/27/2012 (05/24/2012), and Meningioma ([place]) (07/27/2016). here with:  Seizures   Continue Lamictal  Daily headache   Restart gabapentin 100 mg at bedtime  In the past she was unable to tolerate daytime doses of gabapentin due to drowsiness  Memory disturbance   MMSE 25 out of 30  Memory disturbance could be due to daily headaches?  If Memory disturbance does not improve with improvement of her headaches will consider a full work-up at that time  Advised if symptoms worsen or she develops new symptoms she should let us know Follow-up in 3 months or sooner if needed   I spent 30 minutes of face-to-face and non-face-to-face time with patient.  This included previsit chart review, lab review, study review, order entry, electronic health record documentation, patient education.  Ward Givens, MSN, NP-C 02/04/2020, 10:56 AM [facility] [address] [place], [name] [address] [phone]

## 2020-02-04 NOTE — Progress Notes (Signed)
I have read the note, and I agree with the clinical assessment and plan.  Ruslan Mccabe K Acea Yagi   

## 2020-02-06 ENCOUNTER — Encounter: Payer: Self-pay | Admitting: Adult Health

## 2020-02-11 MED ORDER — PREGABALIN 25 MG PO CAPS: 25.0000 mg | ORAL_CAPSULE | Freq: Every day | ORAL | 5 refills | Status: DC

## 2020-03-25 ENCOUNTER — Other Ambulatory Visit: Payer: Self-pay | Admitting: Adult Health

## 2020-05-07 ENCOUNTER — Encounter: Payer: Self-pay | Admitting: Adult Health

## 2020-05-07 ENCOUNTER — Other Ambulatory Visit: Payer: Self-pay

## 2020-05-07 ENCOUNTER — Ambulatory Visit: Payer: Medicaid Other | Admitting: Adult Health

## 2020-05-07 VITALS — BP 138/79 | HR 76 | Ht 61.0 in | Wt 259.0 lb

## 2020-05-07 DIAGNOSIS — R519 Headache, unspecified: Secondary | ICD-10-CM

## 2020-05-07 DIAGNOSIS — R569 Unspecified convulsions: Secondary | ICD-10-CM

## 2020-05-07 NOTE — Patient Instructions (Signed)
Your Plan:  Continue Lamictal  Should not be taking Lyrica and gabapentin: check prescriptions when you get home If your symptoms worsen or you develop new symptoms please let us know.   Thank you for coming to see [place] at [facility]. I hope we have been able to provide you high quality care today.  You may receive a patient satisfaction survey over the next few weeks. We would appreciate your feedback and comments so that we may continue to improve ourselves and the health of our patients.

## 2020-05-07 NOTE — Progress Notes (Signed)
PATIENT: Kendra [name] DOB: 1985-12-25  REASON FOR VISIT: follow up HISTORY FROM: patient  HISTORY OF PRESENT ILLNESS: Today 05/07/20:  Kendra [name] is a 34 year old female with a history of seizures, daily headaches and memory disturbance.  She returns today for follow-up.  She denies any seizure events.  Continues on Lamictal.  Initially she reports that she is taking gabapentin at bedtime and it has been beneficial for headaches however according to my chart message we can switch her to Lyrica.  She is unsure if she is taking gabapentin or Lyrica.  She will check her prescriptions when she get home.  She feels that her memory has remained stable.  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  She returns today for an evaluation.  HISTORY 02/04/20:  Kendra [name] is a 34 year old female with a history of seizures.  She returns today for follow-up.  Reports that she has not had any seizure events.  She continues to do well on Lamictal.  She does report that she has been having headache ongoing for the last 6 months.  She feels that it is slowly getting worse.  She states that it occurs on the left side where she previously had surgery.  States that it radiates down to the back of the neck.  She reports photophobia but denies photophobia.  She states that the discomfort is always there.  Worse at times.  She also reports feeling more fatigued.  She states that she is also noticed some forgetfulness within the last 2 weeks.  She did have a repeat MRI of the brain by neurosurgery.  Scan was stable.   REVIEW OF SYSTEMS: Out of a complete 14 system review of symptoms, the patient complains only of the following symptoms, and all other reviewed systems are negative.  See HPI  ALLERGIES: Allergies  Allergen Reactions  . Asa Buff (Mag [Buffered Aspirin] Anaphylaxis  . Erythromycin Anaphylaxis  . Iodine Anaphylaxis and Hives  . Peanut-Containing Drug Products Anaphylaxis and  Hives    Pt is allergic to peanut oil  . Shellfish Allergy Anaphylaxis, Hives, Swelling and Other (See Comments)    Pt throat swells  . Ibuprofen Rash    HOME MEDICATIONS: Outpatient Medications Prior to Visit  Medication Sig Dispense Refill  . acetaminophen (TYLENOL) 500 MG tablet Take 1,000 mg by mouth every 6 (six) hours as needed (for pain/headaches.).    Marland Kitchen albuterol (PROVENTIL HFA;VENTOLIN HFA) 108 (90 BASE) MCG/ACT inhaler Inhale 2 puffs into the lungs every 6 (six) hours as needed for wheezing or shortness of breath.    . benzonatate (TESSALON) 100 MG capsule Take 1 capsule (100 mg total) by mouth every 8 (eight) hours. 21 capsule 0  . budesonide-formoterol (SYMBICORT) 160-4.5 MCG/ACT inhaler Inhale 2 puffs into the lungs 2 (two) times daily.    . cetirizine-pseudoephedrine (ZYRTEC-D) 5-120 MG tablet Take 1 tablet by mouth daily. (Patient taking differently: Take 1 tablet by mouth as needed. ) 15 tablet 0  . diphenhydrAMINE (BENADRYL) 25 mg capsule Take 1 capsule (25 mg total) by mouth every 6 (six) hours as needed. (Patient taking differently: Take 25 mg by mouth every 6 (six) hours as needed for allergies. ) 30 capsule 0  . EPINEPHrine 0.3 mg/0.3 mL IJ SOAJ injection Inject 0.3 mg into the skin once.    . gabapentin (NEURONTIN) 100 MG capsule Take 1 capsule (100 mg total) by mouth at bedtime. 90 capsule 3  .  lamoTRIgine (LAMICTAL) 100 MG tablet TAKE 2 TABLETS(200 MG) BY MOUTH TWICE DAILY 120 tablet 5  . levonorgestrel (MIRENA) 20 MCG/24HR IUD 1 each by Intrauterine route once.    Marland Kitchen LINZESS 290 MCG CAPS capsule Take 290 mcg by mouth daily as needed (constipation).   3  . pantoprazole (PROTONIX) 20 MG tablet Take 1 tablet (20 mg total) by mouth daily. 14 tablet 0  . predniSONE (DELTASONE) 5 MG tablet Take 5 mg by mouth daily.    . pregabalin (LYRICA) 25 MG capsule Take 1 capsule (25 mg total) by mouth at bedtime. 30 capsule 5   No facility-administered medications prior to visit.     PAST MEDICAL HISTORY: Past Medical History:  Diagnosis Date  . Asthma   . Convulsions/seizures ([place]) 07/27/2016   most recent 10/17/17  . H/O: C-section   . Headache   . Lipoma   . Lipoma of LUQ abdomen, s/p removal 06/27/2012 05/24/2012  . Meningioma ([place]) 07/27/2016   Anterior fossa    PAST SURGICAL HISTORY: Past Surgical History:  Procedure Laterality Date  . APPLICATION OF CRANIAL NAVIGATION N/A 12/10/2016   Procedure: APPLICATION OF CRANIAL NAVIGATION;  Surgeon: Consuella Lose, MD;  Location: [facility];  Service: Neurosurgery;  Laterality: N/A;  . BRAIN SURGERY    . BREAST SURGERY  12/30/11   breast reduction  . CESAREAN SECTION  09/09/2010   [place]  . CESAREAN SECTION N/A 01/13/2015   Procedure: CESAREAN SECTION;  Surgeon: Janyth Pupa, DO;  Location: [facility];  Service: Obstetrics;  Laterality: N/A;  EDD 01/18/15 would like [place] dressing  . CHOLECYSTECTOMY N/A 11/23/2016   Procedure: LAPAROSCOPIC CHOLECYSTECTOMY WITH INTRAOPERATIVE CHOLANGIOGRAM;  Surgeon: Donnie Mesa, MD;  Location: [facility];  Service: General;  Laterality: N/A;  . CRANIOTOMY N/A 12/10/2016   Procedure: BICORONAL CRANIOTOMY FOR RESECTION OF TUMOR;  Surgeon: Consuella Lose, MD;  Location: [facility];  Service: Neurosurgery;  Laterality: N/A;  BICORONAL CRANIOTOMY FOR RESECTION OF TUMOR   . LYMPH NODE DISSECTION  2013   Abdominal    FAMILY HISTORY: Family History  Problem Relation Age of Onset  . Kidney disease Mother        kidney stones  . Cancer Maternal Grandmother        breast  . Cancer Maternal Grandfather        Colon  . Deep vein thrombosis Maternal Grandfather        in throat  . Cancer Maternal Aunt        breast  . Heart disease Maternal Uncle   . Stroke Maternal Uncle   . Liver cancer Maternal Uncle   . Diabetes Paternal Grandfather     SOCIAL HISTORY: Social History   Socioeconomic History  . Marital status: Single    Spouse name: Not on file  . Number of children: 2  . Years of  education: 53  . Highest education level: Not on file  Occupational History  . Occupation: [employer]  Tobacco Use  . Smoking status: Never Smoker  . Smokeless tobacco: Never Used  Substance and Sexual Activity  . Alcohol use: No  . Drug use: No  . Sexual activity: Yes    Partners: Male    Birth control/protection: I.U.D.  Other Topics Concern  . Not on file  Social History Narrative   Lives at home w/ her 2 children   Right-handed   Caffeine: couple of cups per month   Social Determinants of Health   Financial Resource Strain:   .  Difficulty of Paying Living Expenses: Not on file  Food Insecurity:   . Worried About Charity fundraiser in the Last Year: Not on file  . Ran Out of Food in the Last Year: Not on file  Transportation Needs:   . Lack of Transportation (Medical): Not on file  . Lack of Transportation (Non-Medical): Not on file  Physical Activity:   . Days of Exercise per Week: Not on file  . Minutes of Exercise per Session: Not on file  Stress:   . Feeling of Stress : Not on file  Social Connections:   . Frequency of Communication with Friends and Family: Not on file  . Frequency of Social Gatherings with Friends and Family: Not on file  . Attends Religious Services: Not on file  . Active Member of Clubs or Organizations: Not on file  . Attends Archivist Meetings: Not on file  . Marital Status: Not on file  Intimate Partner Violence:   . Fear of Current or Ex-Partner: Not on file  . Emotionally Abused: Not on file  . Physically Abused: Not on file  . Sexually Abused: Not on file      PHYSICAL EXAM  Vitals:   05/07/20 1128  BP: 138/79  Pulse: 76  Weight: 259 lb (117.5 kg)  Height: 5\' 1"  (1.549 m)   Body mass index is 48.94 kg/m.   MMSE - Mini Mental State Exam 02/04/2020  Orientation to time 4  Orientation to Place 5  Registration 3  Attention/ Calculation 3  Recall 1  Language- name 2 objects 2  Language- repeat 1  Language- follow 3  step command 3  Language- read & follow direction 1  Write a sentence 1  Copy design 1  Total score 25     Generalized: Well developed, in no acute distress   Neurological examination  Mentation: Alert oriented to time, place, history taking. Follows all commands speech and language fluent Cranial nerve II-XII: Pupils were equal round reactive to light. Extraocular movements were full, visual field were full on confrontational test.  Head turning and shoulder shrug  were normal and symmetric. Motor: The motor testing reveals 5 over 5 strength of all 4 extremities. Good symmetric motor tone is noted throughout.  Sensory: Sensory testing is intact to soft touch on all 4 extremities. No evidence of extinction is noted.  Coordination: Cerebellar testing reveals good finger-nose-finger and heel-to-shin bilaterally.  Gait and station: Gait is normal.  Reflexes: Deep tendon reflexes are symmetric and normal bilaterally.   DIAGNOSTIC DATA (LABS, IMAGING, TESTING) - I reviewed patient records, labs, notes, testing and imaging myself where available.  Lab Results  Component Value Date   WBC 8.8 09/19/2019   HGB 12.9 09/19/2019   HCT 38.3 09/19/2019   MCV 92 09/19/2019   PLT 421 09/19/2019      Component Value Date/Time   NA 138 09/19/2019 1407   K 4.5 09/19/2019 1407   CL 101 09/19/2019 1407   CO2 26 09/19/2019 1407   GLUCOSE 76 09/19/2019 1407   GLUCOSE 96 09/05/2018 2024   BUN 10 09/19/2019 1407   CREATININE 0.79 09/19/2019 1407   CALCIUM 9.4 09/19/2019 1407   PROT 6.6 09/19/2019 1407   ALBUMIN 3.9 09/19/2019 1407   AST 13 09/19/2019 1407   ALT 9 09/19/2019 1407   ALKPHOS 81 09/19/2019 1407   BILITOT 0.2 09/19/2019 1407   GFRNONAA 100 09/19/2019 1407   GFRAA 115 09/19/2019 1407  ASSESSMENT AND PLAN 34 y.o. year old female  has a past medical history of Asthma, Convulsions/seizures ([place]) (07/27/2016), H/O: C-section, Headache, Lipoma, Lipoma of LUQ abdomen, s/p  removal 06/27/2012 (05/24/2012), and Meningioma ([facility]) (07/27/2016). here with:  Seizures   Continue Lamictal  Daily headaches   Check prescription at home.  She has been taking both gabapentin and Lyrica.  Advised to call us with her correct medication list  Follow-up in 1 year or sooner if needed  I spent 25 minutes of face-to-face and non-face-to-face time with patient.  This included previsit chart review, lab review, study review, order entry, electronic health record documentation, patient education.  Ward Givens, MSN, NP-C 05/07/2020, 11:37 AM [facility] [address] [place], [place] [address] [phone]

## 2020-05-08 NOTE — Progress Notes (Signed)
I have read the note, and I agree with the clinical assessment and plan.  Kendra [name]   

## 2020-06-19 ENCOUNTER — Other Ambulatory Visit: Payer: Self-pay

## 2020-06-19 ENCOUNTER — Emergency Department (HOSPITAL_COMMUNITY)
Admission: EM | Admit: 2020-06-19 | Discharge: 2020-06-19 | Disposition: A | Discharge status: Home / Self Care | Payer: Medicaid Other | Attending: Emergency Medicine | Admitting: Emergency Medicine

## 2020-06-19 ENCOUNTER — Encounter (HOSPITAL_COMMUNITY): Payer: Self-pay

## 2020-06-19 DIAGNOSIS — R059 Cough, unspecified: Secondary | ICD-10-CM | POA: Diagnosis present

## 2020-06-19 DIAGNOSIS — Z91010 Allergy to peanuts: Secondary | ICD-10-CM | POA: Diagnosis not present

## 2020-06-19 DIAGNOSIS — J069 Acute upper respiratory infection, unspecified: Secondary | ICD-10-CM | POA: Diagnosis not present

## 2020-06-19 DIAGNOSIS — J45901 Unspecified asthma with (acute) exacerbation: Secondary | ICD-10-CM | POA: Insufficient documentation

## 2020-06-19 MED ORDER — BUDESONIDE-FORMOTEROL FUMARATE 160-4.5 MCG/ACT IN AERO: 2.0000 | INHALATION_SPRAY | Freq: Two times a day (BID) | RESPIRATORY_TRACT | 0 refills | Status: AC

## 2020-06-19 MED ORDER — PREDNISONE 20 MG PO TABS: 40.0000 mg | ORAL_TABLET | Freq: Every day | ORAL | 0 refills | Status: AC

## 2020-06-19 MED ORDER — ALBUTEROL SULFATE HFA 108 (90 BASE) MCG/ACT IN AERS
6.0000 | INHALATION_SPRAY | Freq: Once | RESPIRATORY_TRACT | Status: AC
  Administered 2020-06-19: 6 via RESPIRATORY_TRACT
  Filled 2020-06-19: qty 6.7

## 2020-06-19 MED ORDER — FLUTICASONE PROPIONATE 50 MCG/ACT NA SUSP: 1.0000 | Freq: Every day | NASAL | 0 refills | Status: AC

## 2020-06-19 MED ORDER — ALBUTEROL SULFATE HFA 108 (90 BASE) MCG/ACT IN AERS: 1.0000 | INHALATION_SPRAY | Freq: Four times a day (QID) | RESPIRATORY_TRACT | 0 refills | Status: DC | PRN

## 2020-06-19 MED ORDER — PREDNISONE 20 MG PO TABS
60.0000 mg | ORAL_TABLET | Freq: Once | ORAL | Status: AC
  Administered 2020-06-19: 60 mg via ORAL
  Filled 2020-06-19: qty 3

## 2020-06-19 MED ORDER — ALBUTEROL SULFATE HFA 108 (90 BASE) MCG/ACT IN AERS: 2.0000 | INHALATION_SPRAY | Freq: Four times a day (QID) | RESPIRATORY_TRACT | 0 refills | Status: AC | PRN

## 2020-06-19 NOTE — ED Provider Notes (Signed)
[facility] DEPT Provider Note   CSN: [id number] Arrival date & time: 06/19/20  9211     History Chief Complaint  Patient presents with  . Cough    Kendra [name] is a 34 y.o. female patient presenting for evaluation of cough, nasal congestion, wheezing, sinus pressure.  Patient states her symptoms began 5 days ago.  She tested negative for Covid several days ago, and once again was tested again today.  She had a negative rapid test, PCR was sent out.  She was started on Amoxil 5 days ago for presumed bilateral AOM and URI.  She reports symptoms not improving, in fact she has worsening chest tightness and cough at this time.  She does report a history of asthma, has been using her albuterol both minimal to no relief.  She has associated nasal congestion sinus pressure.  Denies chest pain, nausea, vomiting.  She denies sick contacts.  HPI     Past Medical History:  Diagnosis Date  . Asthma   . Convulsions/seizures ([place]) 07/27/2016   most recent 10/17/17  . H/O: C-section   . Headache   . Lipoma   . Lipoma of LUQ abdomen, s/p removal 06/27/2012 05/24/2012  . Meningioma ([place]) 07/27/2016   Anterior fossa    Patient Active Problem List   Diagnosis Date Noted  . Convulsions/seizures ([place]) 07/27/2016  . Meningioma ([facility]) 07/27/2016  . Common migraine with intractable migraine 07/27/2016  . Status post repeat low transverse cesarean section 01/14/2015  . Lipoma of LUQ abdomen, s/p removal 06/27/2012 05/24/2012  . 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 Chlamydia infection 12/13/2011  . 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 APPLICATION OF CRANIAL NAVIGATION N/A 12/10/2016   Procedure: APPLICATION OF CRANIAL NAVIGATION;  Surgeon: Consuella Lose, MD;  Location: [facility];  Service: Neurosurgery;  Laterality: N/A;  . BRAIN SURGERY    . BREAST SURGERY  12/30/11   breast  reduction  . CESAREAN SECTION  09/09/2010   [place]  . CESAREAN SECTION N/A 01/13/2015   Procedure: CESAREAN SECTION;  Surgeon: Janyth Pupa, DO;  Location: [facility];  Service: Obstetrics;  Laterality: N/A;  EDD 01/18/15 would like [place] dressing  . CHOLECYSTECTOMY N/A 11/23/2016   Procedure: LAPAROSCOPIC CHOLECYSTECTOMY WITH INTRAOPERATIVE CHOLANGIOGRAM;  Surgeon: Donnie Mesa, MD;  Location: [facility];  Service: General;  Laterality: N/A;  . CRANIOTOMY N/A 12/10/2016   Procedure: BICORONAL CRANIOTOMY FOR RESECTION OF TUMOR;  Surgeon: Consuella Lose, MD;  Location: [facility];  Service: Neurosurgery;  Laterality: N/A;  BICORONAL CRANIOTOMY FOR RESECTION OF TUMOR   . LYMPH NODE DISSECTION  2013   Abdominal     OB History    Gravida  2   Para  2   Term  2   Preterm      AB      Living  2     SAB      TAB      Ectopic      Multiple  0   Live Births  1           Family History  Problem Relation Age of Onset  . Kidney disease Mother        kidney stones  . Cancer Maternal Grandmother        breast  . Cancer Maternal Grandfather        Colon  . Deep vein thrombosis Maternal Grandfather  in throat  . Cancer Maternal Aunt        breast  . Heart disease Maternal Uncle   . Stroke Maternal Uncle   . Liver cancer Maternal Uncle   . Diabetes Paternal Grandfather     Social History   Tobacco Use  . Smoking status: Never Smoker  . Smokeless tobacco: Never Used  Vaping Use  . Vaping Use: Never used  Substance Use Topics  . Alcohol use: No  . Drug use: No    Home Medications Prior to Admission medications   Medication Sig Start Date End Date Taking? Authorizing Provider  acetaminophen (TYLENOL) 500 MG tablet Take 1,000 mg by mouth every 6 (six) hours as needed (for pain/headaches.).    [provider]  albuterol (VENTOLIN HFA) 108 (90 Base) MCG/ACT inhaler Inhale 1-2 puffs into the lungs every 6 (six) hours as needed for wheezing or shortness of breath.  06/19/20   Naylea Wigington, PA-C  albuterol (VENTOLIN HFA) 108 (90 Base) MCG/ACT inhaler Inhale 2 puffs into the lungs every 6 (six) hours as needed for wheezing or shortness of breath. 06/19/20   Casmere Hollenbeck, PA-C  benzonatate (TESSALON) 100 MG capsule Take 1 capsule (100 mg total) by mouth every 8 (eight) hours. 09/30/17   Tasia Catchings, Amy V, PA-C  budesonide-formoterol (SYMBICORT) 160-4.5 MCG/ACT inhaler Inhale 2 puffs into the lungs 2 (two) times daily. 06/19/20   Matt Delpizzo, PA-C  cetirizine-pseudoephedrine (ZYRTEC-D) 5-120 MG tablet Take 1 tablet by mouth daily. Patient taking differently: Take 1 tablet by mouth as needed.  09/30/17   Tasia Catchings, Amy V, PA-C  diphenhydrAMINE (BENADRYL) 25 mg capsule Take 1 capsule (25 mg total) by mouth every 6 (six) hours as needed. Patient taking differently: Take 25 mg by mouth every 6 (six) hours as needed for allergies.  07/17/16   Mackuen, Courteney Lyn, MD  EPINEPHrine 0.3 mg/0.3 mL IJ SOAJ injection Inject 0.3 mg into the skin once. 02/08/19   [provider]  fluticasone (FLONASE) 50 MCG/ACT nasal spray Place 1 spray into both nostrils daily. 06/19/20   Shivaay Stormont, PA-C  gabapentin (NEURONTIN) 100 MG capsule Take 1 capsule (100 mg total) by mouth at bedtime. 02/04/20   Ward Givens, NP  lamoTRIgine (LAMICTAL) 100 MG tablet TAKE 2 TABLETS(200 MG) BY MOUTH TWICE DAILY 03/25/20   Ward Givens, NP  levonorgestrel (MIRENA) 20 MCG/24HR IUD 1 each by Intrauterine route once.    [provider]  LINZESS 290 MCG CAPS capsule Take 290 mcg by mouth daily as needed (constipation).  07/16/16   [provider]  pantoprazole (PROTONIX) 20 MG tablet Take 1 tablet (20 mg total) by mouth daily. 10/05/17   Robinson, [place] N, PA-C  predniSONE (DELTASONE) 20 MG tablet Take 2 tablets (40 mg total) by mouth daily for 4 days. 06/20/20 06/24/20  Catrell Morrone, PA-C  pregabalin (LYRICA) 25 MG capsule Take 1 capsule (25 mg total) by mouth at  bedtime. 02/11/20   Ward Givens, NP    Allergies    Asa buff (mag [buffered aspirin], Erythromycin, Iodine, Peanut-containing drug products, Shellfish allergy, and Ibuprofen  Review of Systems   Review of Systems  HENT: Positive for congestion, sinus pressure and sinus pain.   Respiratory: Positive for cough and wheezing.     Physical Exam Updated Vital Signs BP (!) 127/94 (BP Location: Right Arm)   Pulse 91   Temp 98.4 F (36.9 C) (Oral)   Resp 18   Ht 5' (1.524 m)   Wt 117.9  kg   SpO2 95%   BMI 50.78 kg/m   Physical Exam Vitals and nursing note reviewed.  Constitutional:      General: She is not in acute distress.    Appearance: She is well-developed.     Comments: Sitting in the bed in no acute distress  HENT:     Head: Normocephalic and atraumatic.     Right Ear: A middle ear effusion is present.     Left Ear: A middle ear effusion is present.     Ears:     Comments: Minimal clear fluid noted behind bilateral TMs without TM erythema or bulging.    Nose: Mucosal edema present.     Comments: Nasal mucosal edema    Mouth/Throat:     Comments: Mild OP erythema. Eyes:     Extraocular Movements: Extraocular movements intact.     Conjunctiva/sclera: Conjunctivae normal.     Pupils: Pupils are equal, round, and reactive to light.  Cardiovascular:     Rate and Rhythm: Normal rate and regular rhythm.     Pulses: Normal pulses.  Pulmonary:     Effort: Pulmonary effort is normal. No respiratory distress.     Breath sounds: Wheezing present.     Comments: Speaking full sentences.  Expiratory wheezing.  Sats stable on room air. Abdominal:     General: There is no distension.     Palpations: Abdomen is soft. There is no mass.     Tenderness: There is no abdominal tenderness. There is no guarding or rebound.  Musculoskeletal:        General: Normal range of motion.     Cervical back: Normal range of motion and neck supple.  Skin:    General: Skin is warm and dry.      Capillary Refill: Capillary refill takes less than 2 seconds.  Neurological:     Mental Status: She is alert and oriented to person, place, and time.     ED Results / Procedures / Treatments   Labs (all labs ordered are listed, but only abnormal results are displayed) Labs Reviewed - No data to display  EKG None  Radiology No results found.  Procedures Procedures (including critical care time)  Medications Ordered in ED Medications  albuterol (VENTOLIN HFA) 108 (90 Base) MCG/ACT inhaler 6 puff (6 puffs Inhalation Given 06/19/20 1118)  predniSONE (DELTASONE) tablet 60 mg (60 mg Oral Given 06/19/20 1117)    ED Course  I have reviewed the triage vital signs and the nursing notes.  Pertinent labs & imaging results that were available during my care of the patient were reviewed by me and considered in my medical decision making (see chart for details).    MDM Rules/Calculators/A&P                          Patient presenting for evaluation of 5-day history of URI symptoms.  On exam, patient appears nontoxic.  She does have expiratory wheezing.  Symptoms likely not improving on Amoxil as it is probably viral.  Discussed with patient.  Discussed I do not believe she needs different or new antibiotics.  Discussed symptomatic treatment.  Patient was tested for Covid today, will not repeat.  Will treat with albuterol and prednisone and reassess.  On reassessment, wheezing has improved.  Patient reports symptoms are improving.  Discussed continued symptomatic treatment at home, follow-up with PCP as needed.  At this time, patient appears safe for discharge.  Return  precautions given.  Patient states she understands and agrees to plan.  Saroya BRYSON PALEN was evaluated in Emergency Department on 06/19/2020 for the symptoms described in the history of present illness. She was evaluated in the context of the global COVID-19 pandemic, which necessitated consideration that the patient might be at  risk for infection with the SARS-CoV-2 virus that causes COVID-19. Institutional protocols and algorithms that pertain to the evaluation of patients at risk for COVID-19 are in a state of rapid change based on information released by regulatory bodies including the CDC and federal and state organizations. These policies and algorithms were followed during the patient's care in the ED.   Final Clinical Impression(s) / ED Diagnoses Final diagnoses:  Viral URI with cough  Exacerbation of asthma, unspecified asthma severity, unspecified whether persistent    Rx / DC Orders ED Discharge Orders         Ordered    predniSONE (DELTASONE) 20 MG tablet  Daily        06/19/20 1200    fluticasone (FLONASE) 50 MCG/ACT nasal spray  Daily        06/19/20 1200    budesonide-formoterol (SYMBICORT) 160-4.5 MCG/ACT inhaler  2 times daily        06/19/20 1200    albuterol (VENTOLIN HFA) 108 (90 Base) MCG/ACT inhaler  Every 6 hours PRN        06/19/20 1200    albuterol (VENTOLIN HFA) 108 (90 Base) MCG/ACT inhaler  Every 6 hours PRN        06/19/20 1200           Shoshanna Mcquitty, PA-C 06/19/20 [place], [place], DO 06/19/20 1354

## 2020-06-19 NOTE — Discharge Instructions (Signed)
You likely have a viral illness.  This should be treated symptomatically. Use Tylenol or ibuprofen as needed for fevers or body aches. Use Flonase daily for nasal congestion and cough. Take prednisone as prescribed. Use albuterol every 4 hours for the next 2 days.  After this, use as needed for shortness of breath, chest tightness, coughing fits, wheezing. Use cough medicine as needed.  Make sure you stay well-hydrated with water. Wash your hands frequently to prevent spread of infection. Follow-up with your primary care doctor in 1 week if your symptoms are not improving. Return to the emergency room if you develop chest pain, difficulty breathing, or any new or worsening symptoms.

## 2020-06-19 NOTE — ED Triage Notes (Signed)
Patient states she went to an UC 5 days ago and was prescribed Amoxicillin for bilateral ear infection. Patient started taking the Amoxicillin and now has coughing and chest congestion. Patient had a negative Covid test from 3 days ago.

## 2020-09-05 ENCOUNTER — Encounter: Payer: Self-pay | Admitting: Cardiology

## 2020-09-05 ENCOUNTER — Ambulatory Visit (INDEPENDENT_AMBULATORY_CARE_PROVIDER_SITE_OTHER): Payer: Medicaid Other | Admitting: Cardiology

## 2020-09-05 ENCOUNTER — Other Ambulatory Visit: Payer: Self-pay

## 2020-09-05 ENCOUNTER — Telehealth: Payer: Self-pay | Admitting: Radiology

## 2020-09-05 VITALS — BP 122/84 | HR 84 | Ht 60.0 in | Wt 257.6 lb

## 2020-09-05 DIAGNOSIS — R002 Palpitations: Secondary | ICD-10-CM | POA: Diagnosis not present

## 2020-09-05 DIAGNOSIS — R0602 Shortness of breath: Secondary | ICD-10-CM

## 2020-09-05 DIAGNOSIS — R079 Chest pain, unspecified: Secondary | ICD-10-CM

## 2020-09-05 MED ORDER — PANTOPRAZOLE SODIUM 40 MG PO TBEC: DELAYED_RELEASE_TABLET | ORAL | 3 refills | Status: AC

## 2020-09-05 NOTE — Telephone Encounter (Signed)
Enrolled patient for a 30 day [employer] Event Monitor to be mailed to patients home  

## 2020-09-05 NOTE — Addendum Note (Signed)
Addended by: Antonieta Iba on: 09/05/2020 09:15 AM   Modules accepted: Orders

## 2020-09-05 NOTE — Patient Instructions (Addendum)
Medication Instructions:  Your physician has recommended you make the following change in your medication:  1) START taking Protonix 40 mg twice a day for two weeks, then 40 mg once daily  2) STOP taking Prilosec *If you need a refill on your cardiac medications before your next appointment, please call your pharmacy*  Testing/Procedures: Your physician has requested that you have an echocardiogram. Echocardiography is a painless test that uses sound waves to create images of your heart. It provides your doctor with information about the size and shape of your heart and how well your heart's chambers and valves are working. This procedure takes approximately one hour. There are no restrictions for this procedure.  Your physician has recommended that you wear an event monitor. Event monitors are medical devices that record the heart's electrical activity. Doctors most often [place] these monitors to diagnose arrhythmias. Arrhythmias are problems with the speed or rhythm of the heartbeat. The monitor is a small, portable device. You can wear one while you do your normal daily activities. This is usually used to diagnose what is causing palpitations/syncope (passing out).  Follow-Up: At [facility], you and your health needs are our priority.  As part of our continuing mission to provide you with exceptional heart care, we have created designated Provider Care Teams.  These Care Teams include your primary Cardiologist (physician) and Advanced Practice Providers (APPs -  Physician Assistants and Nurse Practitioners) who all work together to provide you with the care you need, when you need it.  Your next appointment:   4 week(s)  The format for your next appointment:   In Person or Virtual   Provider:   Melina Copa, PA-C   Other Instructions You have been referred to the [facility]

## 2020-09-05 NOTE — Progress Notes (Signed)
Cardiology Consult Note    Date:  09/05/2020   ID:  Kendra [name], DOB 1986/05/24, MRN [id number]  PCP:  Kelton Pillar, MD  Cardiologist:  Fransico Him, MD   Chief Complaint  Patient presents with  . Palpitations  . Chest Pain  . Shortness of Breath    History of Present Illness:  Kendra [name] is a 35 y.o. female who is being seen today for the evaluation of chest pain and SOB at the request of Kelton Pillar, MD.  This is a 35yo AAF with a hx of asthma, seizures and meningioma, GERD and Obesity who recently caller her PCP office complaining of SOB, chest tightness and palpitations.  Her BP was elevated at 150/171mmHg and HR 90bpm in the office.  Her allergist wanted to add an additional inhaler as he thought her asthma may be worse but wanted a referral to Cardiology for palpitations before adding on the inhaler.    She tells me that she has been having palpitations over the past few months.  She says that they occur when she walks up the stairs or any exertional activity. She says that it feels like her chest gets tight and she cannot breath and then gets scared and notices that her heart is racing.  She will take her Symbicort and ProAir and also take her Prilosec because she has GERD as well but will still have pain.  She describes the pain as a burning sensation that radiates into her throat with a sour taste in her mouth.  She denies any radiation into her jaw or arms.  She will break out in a sweat and get nauseated with the discomfort.  She has vomited with it before which has helped her sx.  She has tried [facility] which helps some. She says that she will also get SOB.  She has been trying to lose weight but her breathing has gotten worse.   She has never smoked.  She thinks her moms's brother had an MI.  She denies any  LE edema, dizziness or syncope. She is compliant with her meds and is tolerating meds with no SE.    Past Medical History:  Diagnosis Date  . Asthma   .  Convulsions/seizures ([place]) 07/27/2016   most recent 10/17/17  . H/O: C-section   . Headache   . Lipoma   . Lipoma of LUQ abdomen, s/p removal 06/27/2012 05/24/2012  . Meningioma ([place]) 07/27/2016   Anterior fossa    Past Surgical History:  Procedure Laterality Date  . APPLICATION OF CRANIAL NAVIGATION N/A 12/10/2016   Procedure: APPLICATION OF CRANIAL NAVIGATION;  Surgeon: Consuella Lose, MD;  Location: [place];  Service: Neurosurgery;  Laterality: N/A;  . BRAIN SURGERY    . BREAST SURGERY  12/30/11   breast reduction  . CESAREAN SECTION  09/09/2010   [place]  . CESAREAN SECTION N/A 01/13/2015   Procedure: CESAREAN SECTION;  Surgeon: Janyth Pupa, DO;  Location: [facility];  Service: Obstetrics;  Laterality: N/A;  EDD 01/18/15 would like [place] dressing  . CHOLECYSTECTOMY N/A 11/23/2016   Procedure: LAPAROSCOPIC CHOLECYSTECTOMY WITH INTRAOPERATIVE CHOLANGIOGRAM;  Surgeon: Donnie Mesa, MD;  Location: [place];  Service: General;  Laterality: N/A;  . CRANIOTOMY N/A 12/10/2016   Procedure: BICORONAL CRANIOTOMY FOR RESECTION OF TUMOR;  Surgeon: Consuella Lose, MD;  Location: [place];  Service: Neurosurgery;  Laterality: N/A;  BICORONAL CRANIOTOMY FOR RESECTION OF TUMOR   . LYMPH NODE DISSECTION  2013   Abdominal  Current Medications: Current Meds  Medication Sig  . acetaminophen (TYLENOL) 500 MG tablet Take 1,000 mg by mouth every 6 (six) hours as needed (for pain/headaches.).  Marland Kitchen albuterol (VENTOLIN HFA) 108 (90 Base) MCG/ACT inhaler Inhale 1-2 puffs into the lungs every 6 (six) hours as needed for wheezing or shortness of breath.  Marland Kitchen albuterol (VENTOLIN HFA) 108 (90 Base) MCG/ACT inhaler Inhale 2 puffs into the lungs every 6 (six) hours as needed for wheezing or shortness of breath.  . benzonatate (TESSALON) 100 MG capsule Take 1 capsule (100 mg total) by mouth every 8 (eight) hours.  . budesonide-formoterol (SYMBICORT) 160-4.5 MCG/ACT inhaler Inhale 2 puffs into the lungs 2 (two) times daily.  .  cetirizine-pseudoephedrine (ZYRTEC-D) 5-120 MG tablet Take 1 tablet by mouth daily. (Patient taking differently: Take 1 tablet by mouth as needed.)  . diphenhydrAMINE (BENADRYL) 25 mg capsule Take 1 capsule (25 mg total) by mouth every 6 (six) hours as needed. (Patient taking differently: Take 25 mg by mouth every 6 (six) hours as needed for allergies.)  . EPINEPHrine 0.3 mg/0.3 mL IJ SOAJ injection Inject 0.3 mg into the skin once.  . fluticasone (FLONASE) 50 MCG/ACT nasal spray Place 1 spray into both nostrils daily.  Marland Kitchen gabapentin (NEURONTIN) 100 MG capsule Take 1 capsule (100 mg total) by mouth at bedtime.  . lamoTRIgine (LAMICTAL) 100 MG tablet TAKE 2 TABLETS(200 MG) BY MOUTH TWICE DAILY  . levonorgestrel (MIRENA) 20 MCG/24HR IUD 1 each by Intrauterine route once.  Marland Kitchen LINZESS 290 MCG CAPS capsule Take 290 mcg by mouth daily as needed (constipation).   . pantoprazole (PROTONIX) 20 MG tablet Take 1 tablet (20 mg total) by mouth daily.  . pregabalin (LYRICA) 25 MG capsule Take 1 capsule (25 mg total) by mouth at bedtime.    Allergies:   Asa buff (mag [buffered aspirin], Erythromycin, Iodine, Peanut-containing drug products, Shellfish allergy, Other, and Ibuprofen   Social History   Socioeconomic History  . Marital status: Single    Spouse name: Not on file  . Number of children: 2  . Years of education: 81  . Highest education level: Not on file  Occupational History  . Occupation: [employer]  Tobacco Use  . Smoking status: Never Smoker  . Smokeless tobacco: Never Used  Vaping Use  . Vaping Use: Never used  Substance and Sexual Activity  . Alcohol use: No  . Drug use: No  . Sexual activity: Yes    Partners: Male    Birth control/protection: I.U.D.  Other Topics Concern  . Not on file  Social History Narrative   Lives at home w/ her 2 children   Right-handed   Caffeine: couple of cups per month   Social Determinants of Health   Financial Resource Strain: Not on file  Food  Insecurity: Not on file  Transportation Needs: Not on file  Physical Activity: Not on file  Stress: Not on file  Social Connections: Not on file     Family History:  The patient's family history includes Cancer in her maternal aunt, maternal grandfather, and maternal grandmother; Deep vein thrombosis in her maternal grandfather; Diabetes in her paternal grandfather; Heart disease in her maternal uncle; Kidney disease in her mother; Liver cancer in her maternal uncle; Stroke in her maternal uncle.   ROS:   Please see the history of present illness.    ROS All other systems reviewed and are negative.  No flowsheet data found.     PHYSICAL EXAM:   VS:  BP 122/84   Pulse 84   Ht 5' (1.524 m)   Wt 257 lb 9.6 oz (116.8 kg)   SpO2 97%   BMI 50.31 kg/m    GEN: Well nourished, well developed, in no acute distress  HEENT: normal  Neck: no JVD, carotid bruits, or masses Cardiac: RRR; no murmurs, rubs, or gallops,no edema.  Intact distal pulses bilaterally.  Respiratory:  clear to auscultation bilaterally, normal work of breathing GI: soft, nontender, nondistended, + BS MS: no deformity or atrophy  Skin: warm and dry, no rash Neuro:  Alert and Oriented x 3, Strength and sensation are intact Psych: euthymic mood, full affect  Wt Readings from Last 3 Encounters:  09/05/20 257 lb 9.6 oz (116.8 kg)  06/19/20 260 lb (117.9 kg)  05/07/20 259 lb (117.5 kg)     Studies/Labs Reviewed:   EKG:  EKG is ordered today.  The ekg ordered today demonstrates NSR with no ST changes  Recent Labs: 09/19/2019: ALT 9; BUN 10; Creatinine, Ser 0.79; Hemoglobin 12.9; Platelets 421; Potassium 4.5; Sodium 138   Lipid Panel No results found for: CHOL, TRIG, HDL, CHOLHDL, VLDL, LDLCALC, LDLDIRECT    Additional studies/ records that were reviewed today include:  OV notes from PCP    ASSESSMENT:    1. Palpitations   2. Chest pain of uncertain etiology   3. SOB (shortness of breath)      PLAN:   In order of problems listed above:  1.  Palpitations -I will get an event monitor to assess for arrhythmias  2.  Chest pain -suspect this is related to her underlying GERD and asthma -Rolaids seems to help as well has taking her inhalers -I will get a 2D echo to assess for LVF  -start Protonix 40mg  BID for 2 weeks and then 40mg  daily -she has never smoked and has no fm hx of CAD  3.  SOB -again likely related to asthma and GERD -will get a 2D echo to assess LVF  4.  Morbid Obesity -recommend referral to [facility]  Followup with Melina Copa, PA in 4 weeks  Medication Adjustments/Labs and Tests Ordered: Current medicines are reviewed at length with the patient today.  Concerns regarding medicines are outlined above.  Medication changes, Labs and Tests ordered today are listed in the Patient Instructions below.  There are no Patient Instructions on file for this visit.   Signed, Fransico Him, MD  09/05/2020 8:58 AM    [address], [place], [place]  [address] Phone: [phone]; Fax: [phone]

## 2020-09-10 ENCOUNTER — Ambulatory Visit (INDEPENDENT_AMBULATORY_CARE_PROVIDER_SITE_OTHER): Payer: Medicaid Other

## 2020-09-10 DIAGNOSIS — R002 Palpitations: Secondary | ICD-10-CM

## 2020-09-18 ENCOUNTER — Encounter (HOSPITAL_COMMUNITY): Payer: Self-pay

## 2020-09-18 ENCOUNTER — Encounter: Payer: Self-pay | Admitting: Adult Health

## 2020-09-18 ENCOUNTER — Ambulatory Visit (HOSPITAL_COMMUNITY)
Admission: EM | Admit: 2020-09-18 | Discharge: 2020-09-18 | Disposition: A | Discharge status: Home / Self Care | Payer: Medicaid Other | Attending: Student | Admitting: Student

## 2020-09-18 ENCOUNTER — Telehealth: Payer: Medicaid Other | Admitting: Adult Health

## 2020-09-18 ENCOUNTER — Other Ambulatory Visit: Payer: Self-pay

## 2020-09-18 DIAGNOSIS — Z886 Allergy status to analgesic agent status: Secondary | ICD-10-CM | POA: Insufficient documentation

## 2020-09-18 DIAGNOSIS — Z793 Long term (current) use of hormonal contraceptives: Secondary | ICD-10-CM | POA: Diagnosis not present

## 2020-09-18 DIAGNOSIS — Z87898 Personal history of other specified conditions: Secondary | ICD-10-CM | POA: Insufficient documentation

## 2020-09-18 DIAGNOSIS — H66002 Acute suppurative otitis media without spontaneous rupture of ear drum, left ear: Secondary | ICD-10-CM | POA: Diagnosis not present

## 2020-09-18 DIAGNOSIS — R197 Diarrhea, unspecified: Secondary | ICD-10-CM | POA: Insufficient documentation

## 2020-09-18 DIAGNOSIS — J069 Acute upper respiratory infection, unspecified: Secondary | ICD-10-CM | POA: Diagnosis not present

## 2020-09-18 DIAGNOSIS — Z7951 Long term (current) use of inhaled steroids: Secondary | ICD-10-CM | POA: Diagnosis not present

## 2020-09-18 DIAGNOSIS — U071 COVID-19: Secondary | ICD-10-CM | POA: Insufficient documentation

## 2020-09-18 DIAGNOSIS — Z881 Allergy status to other antibiotic agents status: Secondary | ICD-10-CM | POA: Insufficient documentation

## 2020-09-18 DIAGNOSIS — Z888 Allergy status to other drugs, medicaments and biological substances status: Secondary | ICD-10-CM | POA: Diagnosis not present

## 2020-09-18 DIAGNOSIS — Z880 Allergy status to penicillin: Secondary | ICD-10-CM | POA: Diagnosis not present

## 2020-09-18 DIAGNOSIS — R002 Palpitations: Secondary | ICD-10-CM | POA: Insufficient documentation

## 2020-09-18 DIAGNOSIS — Z79899 Other long term (current) drug therapy: Secondary | ICD-10-CM | POA: Insufficient documentation

## 2020-09-18 DIAGNOSIS — J45909 Unspecified asthma, uncomplicated: Secondary | ICD-10-CM | POA: Diagnosis not present

## 2020-09-18 DIAGNOSIS — J3089 Other allergic rhinitis: Secondary | ICD-10-CM

## 2020-09-18 DIAGNOSIS — R1084 Generalized abdominal pain: Secondary | ICD-10-CM | POA: Insufficient documentation

## 2020-09-18 MED ORDER — CETIRIZINE HCL 10 MG PO TABS: 10.0000 mg | ORAL_TABLET | Freq: Every day | ORAL | 2 refills | Status: DC

## 2020-09-18 MED ORDER — DOXYCYCLINE HYCLATE 100 MG PO CAPS: 100.0000 mg | ORAL_CAPSULE | Freq: Two times a day (BID) | ORAL | 0 refills | Status: AC

## 2020-09-18 MED ORDER — PROMETHAZINE-DM 6.25-15 MG/5ML PO SYRP: 5.0000 mL | ORAL_SOLUTION | Freq: Four times a day (QID) | ORAL | 0 refills | Status: DC | PRN

## 2020-09-18 NOTE — ED Triage Notes (Signed)
Patient presents to Urgent Care with complaints of dry cough, abdominal pain, left ear pain, and runny nose since Sunday. Pt treating with Nyquil, benadryl, Flonase, and ear drops.  Pt states she was recently prescribed Protonix which has caused diarrhea.   Denies fever, n/v.

## 2020-09-18 NOTE — ED Provider Notes (Signed)
Kendra [name]    CSN: [id number] Arrival date & time: 09/18/20  1227      History   Chief Complaint Chief Complaint  Patient presents with  . Nasal Congestion  . Otalgia    HPI Kendra [name] is a 35 y.o. female Patient presents to Urgent Care with complaints of dry cough, abdominal pain, left ear pain, and runny nose since Sunday. History of asthma controlled on albuterol and proair, palpitations followed by cardiology and taking protonix, seizures most recent 2019, headaches, meningioma 2017.  Pt treating symptoms with Nyquil, benadryl, Flonase, and ear drops with some improvement.  Pt states she was recently prescribed Protonix by cardiology which has caused diarrhea. Today she notes dry cough worse at night, generalized abd discomfort, left ear pain/pressure, congestion for 6 days. Denies hearing changes, tinnitus, dizziness. Pt notes she's been taking protonix as directed by cardiology though this is causing occ diarrhea. She plans to message her provider about this. Wearing holter monitor. Denies current chest pain, palpitations.   HPI  Past Medical History:  Diagnosis Date  . Asthma   . Convulsions/seizures ([place]) 07/27/2016   most recent 10/17/17  . H/O: C-section   . Headache   . Lipoma   . Lipoma of LUQ abdomen, s/p removal 06/27/2012 05/24/2012  . Meningioma ([place]) 07/27/2016   Anterior fossa    Patient Active Problem List   Diagnosis Date Noted  . Convulsions/seizures ([place]) 07/27/2016  . Meningioma ([place]) 07/27/2016  . Common migraine with intractable migraine 07/27/2016  . Status post repeat low transverse cesarean section 01/14/2015  . Lipoma of LUQ abdomen, s/p removal 06/27/2012 05/24/2012  . 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 Chlamydia infection 12/13/2011  . 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 APPLICATION OF CRANIAL NAVIGATION N/A 12/10/2016   Procedure:  APPLICATION OF CRANIAL NAVIGATION;  Surgeon: Consuella Lose, MD;  Location: [facility];  Service: Neurosurgery;  Laterality: N/A;  . BRAIN SURGERY    . BREAST SURGERY  12/30/11   breast reduction  . CESAREAN SECTION  09/09/2010   [place]  . CESAREAN SECTION N/A 01/13/2015   Procedure: CESAREAN SECTION;  Surgeon: Janyth Pupa, DO;  Location: [facility];  Service: Obstetrics;  Laterality: N/A;  EDD 01/18/15 would like [place] dressing  . CHOLECYSTECTOMY N/A 11/23/2016   Procedure: LAPAROSCOPIC CHOLECYSTECTOMY WITH INTRAOPERATIVE CHOLANGIOGRAM;  Surgeon: Donnie Mesa, MD;  Location: [facility];  Service: General;  Laterality: N/A;  . CRANIOTOMY N/A 12/10/2016   Procedure: BICORONAL CRANIOTOMY FOR RESECTION OF TUMOR;  Surgeon: Consuella Lose, MD;  Location: [facility];  Service: Neurosurgery;  Laterality: N/A;  BICORONAL CRANIOTOMY FOR RESECTION OF TUMOR   . LYMPH NODE DISSECTION  2013   Abdominal    OB History    Gravida  2   Para  2   Term  2   Preterm      AB      Living  2     SAB      IAB      Ectopic      Multiple  0   Live Births  1            Home Medications    Prior to Admission medications   Medication Sig Start Date End Date Taking? Authorizing Provider  cetirizine (ZYRTEC ALLERGY) 10 MG tablet Take 1 tablet (10 mg total) by mouth daily. 09/18/20 10/18/20 Yes Hazel Sams, PA-C  doxycycline (VIBRAMYCIN) 100 MG capsule Take 1 capsule (100 mg total) by mouth 2 (two) times daily for 7 days. 09/18/20 09/25/20 Yes Hazel Sams, PA-C  promethazine-dextromethorphan (PROMETHAZINE-DM) 6.25-15 MG/5ML syrup Take 5 mLs by mouth 4 (four) times daily as needed for cough. 09/18/20  Yes Hazel Sams, PA-C  acetaminophen (TYLENOL) 500 MG tablet Take 1,000 mg by mouth every 6 (six) hours as needed (for pain/headaches.).    [provider]  albuterol (VENTOLIN HFA) 108 (90 Base) MCG/ACT inhaler Inhale 1-2 puffs into the lungs every 6 (six) hours as needed for wheezing or shortness of  breath. 06/19/20   Caccavale, Sophia, PA-C  albuterol (VENTOLIN HFA) 108 (90 Base) MCG/ACT inhaler Inhale 2 puffs into the lungs every 6 (six) hours as needed for wheezing or shortness of breath. 06/19/20   Caccavale, Sophia, PA-C  benzonatate (TESSALON) 100 MG capsule Take 1 capsule (100 mg total) by mouth every 8 (eight) hours. 09/30/17   Tasia Catchings, Amy V, PA-C  budesonide-formoterol (SYMBICORT) 160-4.5 MCG/ACT inhaler Inhale 2 puffs into the lungs 2 (two) times daily. 06/19/20   Caccavale, Sophia, PA-C  diphenhydrAMINE (BENADRYL) 25 mg capsule Take 1 capsule (25 mg total) by mouth every 6 (six) hours as needed. Patient taking differently: Take 25 mg by mouth every 6 (six) hours as needed for allergies. 07/17/16   Mackuen, Courteney Lyn, MD  EPINEPHrine 0.3 mg/0.3 mL IJ SOAJ injection Inject 0.3 mg into the skin once. 02/08/19   [provider]  fluticasone (FLONASE) 50 MCG/ACT nasal spray Place 1 spray into both nostrils daily. 06/19/20   Caccavale, Sophia, PA-C  gabapentin (NEURONTIN) 100 MG capsule Take 1 capsule (100 mg total) by mouth at bedtime. 02/04/20   Ward Givens, NP  lamoTRIgine (LAMICTAL) 100 MG tablet TAKE 2 TABLETS(200 MG) BY MOUTH TWICE DAILY 03/25/20   Ward Givens, NP  levonorgestrel (MIRENA) 20 MCG/24HR IUD 1 each by Intrauterine route once.    [provider]  LINZESS 290 MCG CAPS capsule Take 290 mcg by mouth daily as needed (constipation).  07/16/16   [provider]  pantoprazole (PROTONIX) 40 MG tablet Take 1 tablet (40 mg) twice daily for two weeks. Then take 1 tablet (40 mg) daily. 09/05/20   Sueanne Margarita, MD  pregabalin (LYRICA) 25 MG capsule Take 1 capsule (25 mg total) by mouth at bedtime. 02/11/20   Ward Givens, NP    Family History Family History  Problem Relation Age of Onset  . Kidney disease Mother        kidney stones  . Cancer Maternal Grandmother        breast  . Cancer Maternal Grandfather        Colon  . Deep vein thrombosis  Maternal Grandfather        in throat  . Healthy Father   . Cancer Maternal Aunt        breast  . Heart disease Maternal Uncle   . Stroke Maternal Uncle   . Liver cancer Maternal Uncle   . Diabetes Paternal Grandfather     Social History Social History   Tobacco Use  . Smoking status: Never Smoker  . Smokeless tobacco: Never Used  Vaping Use  . Vaping Use: Never used  Substance Use Topics  . Alcohol use: No  . Drug use: No     Allergies   Amoxicillin, Asa buff (mag [buffered aspirin], Erythromycin, Iodine, Peanut-containing drug products, Shellfish allergy, Other, and Ibuprofen   Review of Systems Review of  Systems  Constitutional: Negative for appetite change, chills and fever.  HENT: Positive for ear pain. Negative for congestion, rhinorrhea, sinus pressure, sinus pain and sore throat.   Eyes: Negative for redness and visual disturbance.  Respiratory: Positive for cough. Negative for chest tightness, shortness of breath and wheezing.   Cardiovascular: Negative for chest pain and palpitations.  Gastrointestinal: Positive for diarrhea. Negative for abdominal pain, constipation, nausea and vomiting.  Genitourinary: Negative for dysuria, frequency and urgency.  Musculoskeletal: Negative for myalgias.  Neurological: Negative for dizziness, weakness and headaches.  Psychiatric/Behavioral: Negative for confusion.  All other systems reviewed and are negative.    Physical Exam Triage Vital Signs ED Triage Vitals  Enc Vitals Group     BP 09/18/20 1418 123/65     Pulse Rate 09/18/20 1418 93     Resp 09/18/20 1418 16     Temp 09/18/20 1418 98.5 F (36.9 C)     Temp Source 09/18/20 1418 Oral     SpO2 09/18/20 1418 96 %     Weight 09/18/20 1414 260 lb (117.9 kg)     Height --      Head Circumference --      Peak Flow --      Pain Score 09/18/20 1412 10     Pain Loc --      Pain Edu? --      Excl. in [place]? --    No data found.  Updated Vital Signs BP 123/65 (BP  Location: Right Arm)   Pulse 93   Temp 98.5 F (36.9 C) (Oral)   Resp 16   Wt 260 lb (117.9 kg)   SpO2 96%   BMI 50.78 kg/m   Visual Acuity Right Eye Distance:   Left Eye Distance:   Bilateral Distance:    Right Eye Near:   Left Eye Near:    Bilateral Near:     Physical Exam Vitals reviewed.  Constitutional:      General: She is not in acute distress.    Appearance: Normal appearance. She is not ill-appearing.  HENT:     Head: Normocephalic and atraumatic.     Right Ear: Hearing, tympanic membrane, ear canal and external ear normal. No swelling or tenderness. There is no impacted cerumen. No mastoid tenderness. Tympanic membrane is not perforated, erythematous, retracted or bulging.     Left Ear: Hearing, ear canal and external ear normal. No swelling or tenderness. There is no impacted cerumen. No mastoid tenderness. Tympanic membrane is erythematous and bulging. Tympanic membrane is not perforated or retracted.     Nose:     Right Sinus: No maxillary sinus tenderness or frontal sinus tenderness.     Left Sinus: No maxillary sinus tenderness or frontal sinus tenderness.     Mouth/Throat:     Mouth: Mucous membranes are moist.     Pharynx: Uvula midline. No oropharyngeal exudate or posterior oropharyngeal erythema.     Tonsils: No tonsillar exudate.  Cardiovascular:     Rate and Rhythm: Normal rate and regular rhythm.     Heart sounds: Normal heart sounds.  Pulmonary:     Breath sounds: Normal breath sounds and air entry. No wheezing, rhonchi or rales.  Chest:     Chest wall: No tenderness.  Abdominal:     General: Abdomen is flat. Bowel sounds are normal.     Tenderness: There is generalized abdominal tenderness. There is no right CVA tenderness, left CVA tenderness, guarding or rebound. Negative signs include Murphy's  sign, Rovsing's sign and McBurney's sign.  Lymphadenopathy:     Cervical: No cervical adenopathy.  Neurological:     General: No focal deficit present.      Mental Status: She is alert and oriented to person, place, and time.  Psychiatric:        Attention and Perception: Attention and perception normal.        Mood and Affect: Mood and affect normal.        Behavior: Behavior normal. Behavior is cooperative.        Thought Content: Thought content normal.        Judgment: Judgment normal.      UC Treatments / Results  Labs (all labs ordered are listed, but only abnormal results are displayed) Labs Reviewed  SARS CORONAVIRUS 2 (TAT 6-24 HRS)    EKG   Radiology No results found.  Procedures Procedures (including critical care time)  Medications Ordered in UC Medications - No data to display  Initial Impression / Assessment and Plan / UC Course  I have reviewed the triage vital signs and the nursing notes.  Pertinent labs & imaging results that were available during my care of the patient were reviewed by me and considered in my medical decision making (see chart for details).     -For your ear infection, start the antibiotic - doxycycline twice daily for 7 days -Also try the cough syrup: -Promethazine DM cough syrup for congestion/cough. This could make you drowsy, so take at night before bed. -Try Zyrtec once daily for 1-2 weeks. Continue taking if this  helps with your allergic symptoms. Continue flonase.  -For asthma- continue albuterol and proair inhalers as directed. -For fevers/chills, body aches, headaches- use Tylenol and Ibuprofen. You can alternate these for maximum effect. Use up to 3000mg  Tylenol daily and 3200mg  Ibuprofen daily. Make sure to take ibuprofen with food. Check the bottle of ibuprofen/tylenol for specific dosage instructions. -Continue following with cardiologist for palpitations and protonix use. -Covid test sent today. Isolation precautions per CDC guidelines until negative result. Symptomatic relief with OTC Mucinex, Nyquil, etc. Return precautions- new/worsening fevers/chills, shortness of  breath, chest pain, abd pain, etc.    Final Clinical Impressions(s) / UC Diagnoses   Final diagnoses:  Uncomplicated asthma, unspecified asthma severity, unspecified whether persistent  Viral upper respiratory tract infection  Non-recurrent acute suppurative otitis media of left ear without spontaneous rupture of tympanic membrane  Seasonal allergic rhinitis due to other allergic trigger  History of palpitations     Discharge Instructions     -For your ear infection, start the antibiotic - doxycycline twice daily for 7 days -Also try the cough syrup: -Promethazine DM cough syrup for congestion/cough. This could make you drowsy, so take at night before bed. -Try Zyrtec once daily for 1-2 weeks. Continue taking if this  helps with your allergic symptoms. Continue flonase.  -For fevers/chills, body aches, headaches- use Tylenol and Ibuprofen. You can alternate these for maximum effect. Use up to 3000mg  Tylenol daily and 3200mg  Ibuprofen daily. Make sure to take ibuprofen with food. Check the bottle of ibuprofen/tylenol for specific dosage instructions.  We are currently awaiting result of your PCR covid-19 test. This typically comes back in 1-2 days. We'll call you if the result is positive. Otherwise, the result will be sent electronically to your MyChart. You can also call this clinic and ask for your result via telephone.   Please isolate at home while awaiting these results. If your test is positive  for Covid-19, continue to isolate at home for 5 days if you have mild symptoms, or a total of 10 days from symptom onset if you have more severe symptoms. If you quarantine for a shorter period of time (i.e. 5 days), make sure to wear a mask until day 10 of symptoms. Treat your symptoms at home with OTC remedies like tylenol/ibuprofen, mucinex, nyquil, etc. Seek medical attention if you develop high fevers, chest pain, shortness of breath, ear pain, facial pain, etc. Make sure to get up and move  around every 2-3 hours while convalescing to help prevent blood clots. Drink plenty of fluids, and rest as much as possible.     ED Prescriptions    Medication Sig Dispense Auth. Provider   doxycycline (VIBRAMYCIN) 100 MG capsule Take 1 capsule (100 mg total) by mouth 2 (two) times daily for 7 days. 14 capsule Hazel Sams, PA-C   cetirizine (ZYRTEC ALLERGY) 10 MG tablet Take 1 tablet (10 mg total) by mouth daily. 30 tablet Hazel Sams, PA-C   promethazine-dextromethorphan (PROMETHAZINE-DM) 6.25-15 MG/5ML syrup Take 5 mLs by mouth 4 (four) times daily as needed for cough. 118 mL Hazel Sams, PA-C     PDMP not reviewed this encounter.   Hazel Sams, PA-C 09/18/20 1517

## 2020-09-18 NOTE — Discharge Instructions (Addendum)
-  For your ear infection, start the antibiotic - doxycycline twice daily for 7 days -Also try the cough syrup: -Promethazine DM cough syrup for congestion/cough. This could make you drowsy, so take at night before bed. -Try Zyrtec once daily for 1-2 weeks. Continue taking if this  helps with your allergic symptoms. Continue flonase.  -For fevers/chills, body aches, headaches- use Tylenol and Ibuprofen. You can alternate these for maximum effect. Use up to 3000mg  Tylenol daily and 3200mg  Ibuprofen daily. Make sure to take ibuprofen with food. Check the bottle of ibuprofen/tylenol for specific dosage instructions.  We are currently awaiting result of your PCR covid-19 test. This typically comes back in 1-2 days. We'll call you if the result is positive. Otherwise, the result will be sent electronically to your MyChart. You can also call this clinic and ask for your result via telephone.   Please isolate at home while awaiting these results. If your test is positive for Covid-19, continue to isolate at home for 5 days if you have mild symptoms, or a total of 10 days from symptom onset if you have more severe symptoms. If you quarantine for a shorter period of time (i.e. 5 days), make sure to wear a mask until day 10 of symptoms. Treat your symptoms at home with OTC remedies like tylenol/ibuprofen, mucinex, nyquil, etc. Seek medical attention if you develop high fevers, chest pain, shortness of breath, ear pain, facial pain, etc. Make sure to get up and move around every 2-3 hours while convalescing to help prevent blood clots. Drink plenty of fluids, and rest as much as possible.

## 2020-09-19 LAB — SARS CORONAVIRUS 2 (TAT 6-24 HRS): SARS Coronavirus 2: POSITIVE — AB

## 2020-09-23 ENCOUNTER — Other Ambulatory Visit (HOSPITAL_COMMUNITY): Payer: Medicaid Other

## 2020-09-29 ENCOUNTER — Telehealth: Payer: Self-pay

## 2020-09-29 NOTE — Progress Notes (Deleted)
{Choose 1 Note Type (Video or Telephone):[phone]}   The patient was identified using 2 identifiers.  Date:  09/29/2020   ID:  Kendra [name], DOB 01-Oct-1985, MRN [id number]  {Patient Location:[phone]::"Home"} {Provider [phone]::"Home Office"}  PCP:  Kelton Pillar, MD  Cardiologist:  Fransico Him, MD *** Electrophysiologist:  None   Evaluation Performed:  Follow-Up Visit  Chief Complaint:  ***  History of Present Illness:    Kendra [name] is a 35 y.o. female with history of asthma, seizures, meningioma, GERD and obesity who presents for cardiology follow-up. She recently saw Dr. Radford Pax 09/05/20 to establish care for SOB, chest tightness and palpitations. See her note for full details. Chest discomfort and SOB were felt due to underlying GERD and asthma.    Labs Independently Reviewed   Past Medical History:  Diagnosis Date  . Asthma   . Convulsions/seizures ([place]) 07/27/2016   most recent 10/17/17  . H/O: C-section   . Headache   . Lipoma   . Lipoma of LUQ abdomen, s/p removal 06/27/2012 05/24/2012  . Meningioma ([place]) 07/27/2016   Anterior fossa   Past Surgical History:  Procedure Laterality Date  . APPLICATION OF CRANIAL NAVIGATION N/A 12/10/2016   Procedure: APPLICATION OF CRANIAL NAVIGATION;  Surgeon: Consuella Lose, MD;  Location: [facility];  Service: Neurosurgery;  Laterality: N/A;  . BRAIN SURGERY    . BREAST SURGERY  12/30/11   breast reduction  . CESAREAN SECTION  09/09/2010   [place]  . CESAREAN SECTION N/A 01/13/2015   Procedure: CESAREAN SECTION;  Surgeon: Janyth Pupa, DO;  Location: [facility];  Service: Obstetrics;  Laterality: N/A;  EDD 01/18/15 would like [place] dressing  . CHOLECYSTECTOMY N/A 11/23/2016   Procedure: LAPAROSCOPIC CHOLECYSTECTOMY WITH INTRAOPERATIVE CHOLANGIOGRAM;  Surgeon: Donnie Mesa, MD;  Location: [facility];  Service: General;  Laterality: N/A;  . CRANIOTOMY N/A 12/10/2016   Procedure: BICORONAL CRANIOTOMY FOR RESECTION OF TUMOR;   Surgeon: Consuella Lose, MD;  Location: [facility];  Service: Neurosurgery;  Laterality: N/A;  BICORONAL CRANIOTOMY FOR RESECTION OF TUMOR   . LYMPH NODE DISSECTION  2013   Abdominal     No outpatient medications have been marked as taking for the 10/01/20 encounter (Appointment) with Charlie Pitter, PA-C.     Allergies:   Amoxicillin, Asa buff (mag [buffered aspirin], Erythromycin, Iodine, Peanut-containing drug products, Shellfish allergy, Other, and Ibuprofen   Social History   Tobacco Use  . Smoking status: Never Smoker  . Smokeless tobacco: Never Used  Vaping Use  . Vaping Use: Never used  Substance Use Topics  . Alcohol use: No  . Drug use: No     Family Hx: The patient's family history includes Cancer in her maternal aunt, maternal grandfather, and maternal grandmother; Deep vein thrombosis in her maternal grandfather; Diabetes in her paternal grandfather; Healthy in her father; Heart disease in her maternal uncle; Kidney disease in her mother; Liver cancer in her maternal uncle; Stroke in her maternal uncle.  ROS:   Please see the history of present illness.    *** All other systems reviewed and are negative.   Prior CV studies:   The following studies were reviewed today:      Labs/Other Tests and Data Reviewed:    EKG:  {EKG/Telemetry Strips Reviewed:[phone]}  Recent Labs: No results found for requested labs within last 8760 hours.   Recent Lipid Panel No results found for: CHOL, TRIG, HDL, CHOLHDL, LDLCALC, LDLDIRECT  Wt Readings from Last 3 Encounters:  09/18/20 260 lb (  117.9 kg)  09/05/20 257 lb 9.6 oz (116.8 kg)  06/19/20 260 lb (117.9 kg)     Objective:    Vital Signs:  There were no vitals taken for this visit.   VS reviewed. General - *** in no acute distress Pulm - No labored breathing, no coughing during visit, no audible wheezing, speaking in full sentences Neuro - A+Ox3, no slurred speech, answers questions appropriately Psych - Pleasant  affect  ASSESSMENT & PLAN:    1. ***   Time:   Today, I have spent *** minutes with the patient with telehealth technology discussing the above problems.     Medication Adjustments/Labs and Tests Ordered: Current medicines are reviewed at length with the patient today.  Testing and concerns regarding medicines are outlined above.    Follow Up:  {F/U [phone]} {follow up:15908}  Signed, Charlie Pitter, PA-C  09/29/2020 12:11 PM    [facility]

## 2020-09-29 NOTE — Telephone Encounter (Signed)
-----   Message from Charlie Pitter, [place] sent at 09/29/2020 12:13 PM EST ----- Regarding: Scheduling? Hey Triage, I think Kendra [name] is out per Epic so need someone to look at this appt for Wed. This is a patient who was recently seen by Dr. Radford Pax for SOB, palpitations, chest discomfort. Symptoms were felt due to asthma and GERD so PPI started. Dr. Radford Pax recommended echo and event monitor and neither are back yet. Not sure it makes sense to keep appt until these are back unless there are new concerns. Can you look into this? Thanks! DD

## 2020-09-29 NOTE — Telephone Encounter (Signed)
Spoke with the pt and she reports that she is feeling well.Her palpitations are improving. She will move her video visit with Melina Copa PA after her 30 day event monitor is complete and after her Echo planned for 10/15/20... we will move her follow up appt to  10/17/20.

## 2020-09-30 ENCOUNTER — Emergency Department (HOSPITAL_COMMUNITY)
Admission: EM | Admit: 2020-09-30 | Discharge: 2020-10-01 | Disposition: A | Discharge status: Home / Self Care | Payer: Medicaid Other | Attending: Emergency Medicine | Admitting: Emergency Medicine

## 2020-09-30 ENCOUNTER — Ambulatory Visit (INDEPENDENT_AMBULATORY_CARE_PROVIDER_SITE_OTHER): Payer: Medicaid Other | Admitting: Podiatry

## 2020-09-30 ENCOUNTER — Other Ambulatory Visit: Payer: Self-pay

## 2020-09-30 ENCOUNTER — Encounter (HOSPITAL_COMMUNITY): Payer: Self-pay | Admitting: Emergency Medicine

## 2020-09-30 ENCOUNTER — Ambulatory Visit (INDEPENDENT_AMBULATORY_CARE_PROVIDER_SITE_OTHER): Payer: Medicaid Other

## 2020-09-30 DIAGNOSIS — Z7951 Long term (current) use of inhaled steroids: Secondary | ICD-10-CM | POA: Diagnosis not present

## 2020-09-30 DIAGNOSIS — J45909 Unspecified asthma, uncomplicated: Secondary | ICD-10-CM | POA: Diagnosis not present

## 2020-09-30 DIAGNOSIS — R569 Unspecified convulsions: Secondary | ICD-10-CM | POA: Diagnosis not present

## 2020-09-30 DIAGNOSIS — R200 Anesthesia of skin: Secondary | ICD-10-CM | POA: Diagnosis not present

## 2020-09-30 DIAGNOSIS — M25511 Pain in right shoulder: Secondary | ICD-10-CM | POA: Diagnosis not present

## 2020-09-30 DIAGNOSIS — S96912A Strain of unspecified muscle and tendon at ankle and foot level, left foot, initial encounter: Secondary | ICD-10-CM | POA: Diagnosis not present

## 2020-09-30 DIAGNOSIS — M79672 Pain in left foot: Secondary | ICD-10-CM | POA: Diagnosis not present

## 2020-09-30 DIAGNOSIS — R609 Edema, unspecified: Secondary | ICD-10-CM

## 2020-09-30 LAB — BASIC METABOLIC PANEL
Anion gap: 8 (ref 5–15)
BUN: 10 mg/dL (ref 6–20)
CO2: 26 mmol/L (ref 22–32)
Calcium: 8.9 mg/dL (ref 8.9–10.3)
Chloride: 100 mmol/L (ref 98–111)
Creatinine, Ser: 0.76 mg/dL (ref 0.44–1.00)
GFR, Estimated: >60 mL/min (ref >60)
Glucose, Bld: 102 mg/dL — ABNORMAL HIGH (ref 70–99)
Potassium: 4 mmol/L (ref 3.5–5.1)
Sodium: 134 mmol/L — ABNORMAL LOW (ref 135–145)

## 2020-09-30 LAB — CBC
HCT: 38.7 % (ref 36.0–46.0)
Hemoglobin: 13 g/dL (ref 12.0–15.0)
MCH: 31 pg (ref 26.0–34.0)
MCHC: 33.6 g/dL (ref 30.0–36.0)
MCV: 92.1 fL (ref 80.0–100.0)
Platelets: 447 10*3/uL — ABNORMAL HIGH (ref 150–400)
RBC: 4.2 MIL/uL (ref 3.87–5.11)
RDW: 13.4 % (ref 11.5–15.5)
WBC: 10.9 10*3/uL — ABNORMAL HIGH (ref 4.0–10.5)
nRBC: 0 % (ref 0.0–0.2)

## 2020-09-30 LAB — I-STAT BETA HCG BLOOD, ED (MC, WL, AP ONLY): I-stat hCG, quantitative: <5 m[IU]/mL (ref <5)

## 2020-09-30 NOTE — ED Triage Notes (Addendum)
Pt arrives to ED via [facility] with c/o seizure this afternoon. 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 Seizure was tonic-clonic lasted less than one minute. Pt was post-ictal for over 30 minutes per EMS. Becoming more alter has they entered the ED. Pt with hx of seizure, last one was 1 year ago. Pt with swelling to forehead from hitting face on the floor. Currently A&O x4.

## 2020-10-01 ENCOUNTER — Emergency Department (HOSPITAL_COMMUNITY): Payer: Medicaid Other

## 2020-10-01 ENCOUNTER — Telehealth: Payer: Medicaid Other | Admitting: Physician Assistant

## 2020-10-01 LAB — CBG MONITORING, ED: Glucose-Capillary: 94 mg/dL (ref 70–99)

## 2020-10-01 MED ORDER — ACETAMINOPHEN 325 MG PO TABS
650.0000 mg | ORAL_TABLET | Freq: Once | ORAL | Status: DC | PRN
  Filled 2020-10-01: qty 2

## 2020-10-01 MED ORDER — LAMOTRIGINE 100 MG PO TABS
200.0000 mg | ORAL_TABLET | Freq: Once | ORAL | Status: AC
  Administered 2020-10-01: 200 mg via ORAL
  Filled 2020-10-01: qty 2

## 2020-10-01 NOTE — ED Notes (Signed)
Patient transported to x-ray. ?

## 2020-10-01 NOTE — ED Notes (Signed)
Checked patient cbg it was 57 notified RN of blood sugar patient is resting with call bell in reach

## 2020-10-01 NOTE — Progress Notes (Signed)
Orthopedic Tech Progress Note Patient Details:  Kendra [name] 1985/11/28 [id number]  Ortho Devices Type of Ortho Device: Shoulder immobilizer Ortho Device/Splint Location: RUE Ortho Device/Splint Interventions: Ordered,Application,Adjustment   Post Interventions Patient Tolerated: Well Instructions Provided: Care of device   Janit Pagan 10/01/2020, 9:56 AM

## 2020-10-01 NOTE — ED Notes (Signed)
Got patient on the monitor updated patient vitals patient is resting with call bell in reach

## 2020-10-01 NOTE — Discharge Instructions (Addendum)
Recommend Tylenol, Motrin, ice for right shoulder pain.  There may be of small fracture to a bone called the acromion.  However, suspect more that you have a bone bruise/strain of the right shoulder.  You will be treated with a sling immobilizer.  Please use this for comfort.  Do not bear any weight with the right upper extremity until you are seen by sports medicine.  Otherwise work-up was unremarkable today.  Continue to take your medications.

## 2020-10-01 NOTE — ED Notes (Signed)
Pt request tylenol for HA while in lobby (allergy to ibuprofen). Pt reassessed for the same

## 2020-10-01 NOTE — ED Notes (Signed)
Ortho en route.  

## 2020-10-01 NOTE — ED Provider Notes (Signed)
[facility] EMERGENCY DEPARTMENT Provider Note   CSN: [id number] Arrival date & time: 09/30/20  1631     History Chief Complaint  Patient presents with  . Seizures    [place] is a 35 y.o. female.  The history is provided by the patient.  Seizures Seizure activity on arrival: no   Seizure type:  Unable to specify Initial focality:  Unable to specify Postictal symptoms: confusion   Return to baseline: yes   Severity:  Mild Timing:  Once Context comment:  Dx with covid two weeks ago, missed some doses of seizure meds and bad sleep. No SOB, feels like she is getting over covid. Recent head injury:  During the event PTA treatment:  None History of seizures: yes        Past Medical History:  Diagnosis Date  . Asthma   . Convulsions/seizures ([place]) 07/27/2016   most recent 10/17/17  . H/O: C-section   . Headache   . Lipoma   . Lipoma of LUQ abdomen, s/p removal 06/27/2012 05/24/2012  . Meningioma ([place]) 07/27/2016   Anterior fossa    Patient Active Problem List   Diagnosis Date Noted  . Convulsions/seizures ([place]) 07/27/2016  . Meningioma ([place]) 07/27/2016  . Common migraine with intractable migraine 07/27/2016  . Status post repeat low transverse cesarean section 01/14/2015  . Lipoma of LUQ abdomen, s/p removal 06/27/2012 05/24/2012  . 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 Chlamydia infection 12/13/2011  . 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 APPLICATION OF CRANIAL NAVIGATION N/A 12/10/2016   Procedure: APPLICATION OF CRANIAL NAVIGATION;  Surgeon: Consuella Lose, MD;  Location: [facility];  Service: Neurosurgery;  Laterality: N/A;  . BRAIN SURGERY    . BREAST SURGERY  12/30/11   breast reduction  . CESAREAN SECTION  09/09/2010   [place]  . CESAREAN SECTION N/A 01/13/2015   Procedure: CESAREAN SECTION;  Surgeon: Janyth Pupa, DO;  Location: [facility];  Service:  Obstetrics;  Laterality: N/A;  EDD 01/18/15 would like [place] dressing  . CHOLECYSTECTOMY N/A 11/23/2016   Procedure: LAPAROSCOPIC CHOLECYSTECTOMY WITH INTRAOPERATIVE CHOLANGIOGRAM;  Surgeon: Donnie Mesa, MD;  Location: [facility];  Service: General;  Laterality: N/A;  . CRANIOTOMY N/A 12/10/2016   Procedure: BICORONAL CRANIOTOMY FOR RESECTION OF TUMOR;  Surgeon: Consuella Lose, MD;  Location: [facility];  Service: Neurosurgery;  Laterality: N/A;  BICORONAL CRANIOTOMY FOR RESECTION OF TUMOR   . LYMPH NODE DISSECTION  2013   Abdominal     OB History    Gravida  2   Para  2   Term  2   Preterm      AB      Living  2     SAB      IAB      Ectopic      Multiple  0   Live Births  1           Family History  Problem Relation Age of Onset  . Kidney disease Mother        kidney stones  . Cancer Maternal Grandmother        breast  . Cancer Maternal Grandfather        Colon  . Deep vein thrombosis Maternal Grandfather        in throat  . Healthy Father   . Cancer Maternal Aunt        breast  .  Heart disease Maternal Uncle   . Stroke Maternal Uncle   . Liver cancer Maternal Uncle   . Diabetes Paternal Grandfather     Social History   Tobacco Use  . Smoking status: Never Smoker  . Smokeless tobacco: Never Used  Vaping Use  . Vaping Use: Never used  Substance Use Topics  . Alcohol use: No  . Drug use: No    Home Medications Prior to Admission medications   Medication Sig Start Date End Date Taking? Authorizing Provider  acetaminophen (TYLENOL) 500 MG tablet Take 1,000 mg by mouth every 6 (six) hours as needed (for pain/headaches.).    [provider]  albuterol (VENTOLIN HFA) 108 (90 Base) MCG/ACT inhaler Inhale 1-2 puffs into the lungs every 6 (six) hours as needed for wheezing or shortness of breath. 06/19/20   Caccavale, Sophia, PA-C  albuterol (VENTOLIN HFA) 108 (90 Base) MCG/ACT inhaler Inhale 2 puffs into the lungs every 6 (six) hours as needed for  wheezing or shortness of breath. 06/19/20   Caccavale, Sophia, PA-C  benzonatate (TESSALON) 100 MG capsule Take 1 capsule (100 mg total) by mouth every 8 (eight) hours. 09/30/17   Tasia Catchings, Amy V, PA-C  budesonide-formoterol (SYMBICORT) 160-4.5 MCG/ACT inhaler Inhale 2 puffs into the lungs 2 (two) times daily. 06/19/20   Caccavale, Sophia, PA-C  cetirizine (ZYRTEC ALLERGY) 10 MG tablet Take 1 tablet (10 mg total) by mouth daily. 09/18/20 10/18/20  Hazel Sams, PA-C  diphenhydrAMINE (BENADRYL) 25 mg capsule Take 1 capsule (25 mg total) by mouth every 6 (six) hours as needed. Patient taking differently: Take 25 mg by mouth every 6 (six) hours as needed for allergies. 07/17/16   Mackuen, Courteney Lyn, MD  EPINEPHrine 0.3 mg/0.3 mL IJ SOAJ injection Inject 0.3 mg into the skin once. 02/08/19   [provider]  fluticasone (FLONASE) 50 MCG/ACT nasal spray Place 1 spray into both nostrils daily. 06/19/20   Caccavale, Sophia, PA-C  gabapentin (NEURONTIN) 100 MG capsule Take 1 capsule (100 mg total) by mouth at bedtime. 02/04/20   Ward Givens, NP  lamoTRIgine (LAMICTAL) 100 MG tablet TAKE 2 TABLETS(200 MG) BY MOUTH TWICE DAILY 03/25/20   Ward Givens, NP  levonorgestrel (MIRENA) 20 MCG/24HR IUD 1 each by Intrauterine route once.    [provider]  LINZESS 290 MCG CAPS capsule Take 290 mcg by mouth daily as needed (constipation).  07/16/16   [provider]  pantoprazole (PROTONIX) 40 MG tablet Take 1 tablet (40 mg) twice daily for two weeks. Then take 1 tablet (40 mg) daily. 09/05/20   Sueanne Margarita, MD  pregabalin (LYRICA) 25 MG capsule Take 1 capsule (25 mg total) by mouth at bedtime. 02/11/20   Ward Givens, NP  promethazine-dextromethorphan (PROMETHAZINE-DM) 6.25-15 MG/5ML syrup Take 5 mLs by mouth 4 (four) times daily as needed for cough. 09/18/20   Hazel Sams, PA-C    Allergies    Amoxicillin, Asa buff (mag [buffered aspirin], Erythromycin, Iodine, Peanut-containing  drug products, Shellfish allergy, Other, and Ibuprofen  Review of Systems   Review of Systems  Constitutional: Negative for chills and fever.  HENT: Negative for ear pain and sore throat.   Eyes: Negative for pain and visual disturbance.  Respiratory: Negative for cough and shortness of breath.   Cardiovascular: Negative for chest pain and palpitations.  Gastrointestinal: Negative for abdominal pain and vomiting.  Genitourinary: Negative for dysuria and hematuria.  Musculoskeletal: Positive for arthralgias. Negative for back pain.  Skin: Negative for color change  and rash.  Neurological: Positive for seizures. Negative for syncope.  All other systems reviewed and are negative.   Physical Exam Updated Vital Signs  ED Triage Vitals  Enc Vitals Group     BP 09/30/20 1655 115/64     Pulse Rate 09/30/20 1655 97     Resp 09/30/20 1655 18     Temp 09/30/20 1655 98.2 F (36.8 C)     Temp Source 09/30/20 1655 Oral     SpO2 09/30/20 1655 98 %     Weight 10/01/20 0817 259 lb 14.8 oz (117.9 kg)     Height 10/01/20 0817 5' (1.524 m)     Head Circumference --      Peak Flow --      Pain Score 09/30/20 1805 0     Pain Loc --      Pain Edu? --      Excl. in [place]? --     Physical Exam Vitals and nursing note reviewed.  Constitutional:      General: She is not in acute distress.    Appearance: She is well-developed and well-nourished. She is not ill-appearing.  HENT:     Head:     Comments: Swelling and some mild bruising to the forehead    Nose: Nose normal.  Eyes:     Extraocular Movements: Extraocular movements intact.     Conjunctiva/sclera: Conjunctivae normal.     Pupils: Pupils are equal, round, and reactive to light.  Cardiovascular:     Rate and Rhythm: Normal rate and regular rhythm.     Pulses: Normal pulses.     Heart sounds: Normal heart sounds. No murmur heard.   Pulmonary:     Effort: Pulmonary effort is normal. No respiratory distress.     Breath sounds: Normal  breath sounds.  Abdominal:     General: Abdomen is flat.     Palpations: Abdomen is soft.     Tenderness: There is no abdominal tenderness.  Musculoskeletal:        General: Tenderness present. No edema. Normal range of motion.     Cervical back: Normal range of motion and neck supple.     Comments: Tenderness to the right shoulder  Skin:    General: Skin is warm and dry.     Capillary Refill: Capillary refill takes less than 2 seconds.  Neurological:     General: No focal deficit present.     Mental Status: She is alert.     Cranial Nerves: No cranial nerve deficit.     Motor: No weakness.  Psychiatric:        Mood and Affect: Mood and affect normal.     ED Results / Procedures / Treatments   Labs (all labs ordered are listed, but only abnormal results are displayed) Labs Reviewed  BASIC METABOLIC PANEL - Abnormal; Notable for the following components:      Result Value   Sodium 134 (*)    Glucose, Bld 102 (*)    All other components within normal limits  CBC - Abnormal; Notable for the following components:   WBC 10.9 (*)    Platelets 447 (*)    All other components within normal limits  CBG MONITORING, ED  I-STAT BETA HCG BLOOD, ED ([facility], [facility], [facility] ONLY)    EKG None  Radiology DG Shoulder Right  Result Date: 10/01/2020 CLINICAL DATA:  Fall.  Pain. EXAM: RIGHT SHOULDER - 2+ VIEW COMPARISON:  06/10/2019. FINDINGS: Subtle fracture of the  acromion process cannot be excluded. No evidence of dislocation or separation. IMPRESSION: Subtle fracture of the acromion process cannot be excluded. Electronically Signed   By: Marcello Moores  Register   On: 10/01/2020 09:16   CT Head Wo Contrast  Result Date: 10/01/2020 CLINICAL DATA:  Seizure with fall. EXAM: CT HEAD WITHOUT CONTRAST TECHNIQUE: Contiguous axial images were obtained from the base of the skull through the vertex without intravenous contrast. COMPARISON:  Head CT June 10, 2019. FINDINGS: Brain: Postsurgical change prior right  frontal craniotomy for resection of planum sphenoidale meningioma with similar small amount of right greater than left inferior frontal lobe encephalomalacia. No acute territorial infarction, or intracranial hemorrhage. Vascular: No hyperdense vessel or unexpected calcification. Skull: Prior anterior craniotomy for resection of planum sphenoidale meningioma. No acute osseous abnormality. Sinuses/Orbits: Pneumatized fluid and mucosal thickening in the bilateral maxillary sinuses. Other: None. IMPRESSION: 1. No acute intracranial abnormality. 2. Postsurgical change prior right frontal craniotomy for resection of planum sphenoidale meningioma with similar small amount of right greater than left inferior frontal lobe encephalomalacia. 3. Pneumatized fluid and mucosal thickening in the bilateral maxillary sinuses. Correlate for acute sinusitis. Electronically Signed   By: Dahlia Bailiff MD   On: 10/01/2020 09:18    Procedures Procedures   Medications Ordered in ED Medications  acetaminophen (TYLENOL) tablet 650 mg (has no administration in time range)  lamoTRIgine (LAMICTAL) tablet 200 mg (200 mg Oral Given 10/01/20 0830)    ED Course  I have reviewed the triage vital signs and the nursing notes.  Pertinent labs & imaging results that were available during my care of the patient were reviewed by me and considered in my medical decision making (see chart for details).    MDM Rules/Calculators/A&P                          [name] CEIRRA BELLI is here with breakthrough seizures.  History of seizures.  Normal vitals.  No fever.  Diagnosed with Covid 2 weeks ago but not having any major respiratory symptoms.  No respiratory distress.  Has missed some doses of her seizure medication.  Has not been sleeping very well.  Suspect the cause of her seizure overnight.  She is back to baseline after being confused after seizure per triage note.  Patient did hit her head and does have some bruising to her forehead.  She  does have some tenderness in the right shoulder as well.  However does not appear to be dislocated.  Will get CT scan of head, x-ray of right shoulder.  Screening lab work has ready been done and shows no significant anemia, electrolyte abnormality, kidney injury.  Will give home dose of seizure medications.  Assuming that imaging is unremarkable will discharge her back home.  CT head with no acute findings.  No head bleed.  X-ray of the right shoulder shows may be a possible small acromium fracture but nothing very obvious.  Could be a contusion versus some strain.  Either way we will place her in a sling and have her follow-up with sports medicine as this is a nonoperative process even if there is a small fracture.  Recommend gentle exercises with the right shoulder no heavy lifting no bearing any heavy weight to the right upper extremity.  Discharged in good condition.  This chart was dictated using voice recognition software.  Despite best efforts to proofread,  errors can occur which can change the documentation meaning.   Final  Clinical Impression(s) / ED Diagnoses Final diagnoses:  Seizure ([place])  Acute pain of right shoulder    Rx / DC Orders ED Discharge Orders    None       Lennice Sites, DO 10/01/20 [phone]

## 2020-10-03 LAB — ANA: Anti Nuclear Antibody (ANA): NEGATIVE

## 2020-10-03 LAB — SEDIMENTATION RATE: Sed Rate: 36 mm/h — ABNORMAL HIGH (ref 0–20)

## 2020-10-03 LAB — HLA-B27 ANTIGEN: HLA-B27 Antigen: NEGATIVE

## 2020-10-03 LAB — C-REACTIVE PROTEIN: CRP: 13.6 mg/L — ABNORMAL HIGH (ref <8.0)

## 2020-10-03 LAB — RHEUMATOID FACTOR: Rheumatoid fact SerPl-aCnc: <14 IU/mL (ref <14)

## 2020-10-07 ENCOUNTER — Telehealth: Payer: Self-pay | Admitting: Podiatry

## 2020-10-07 NOTE — Progress Notes (Signed)
Subjective:   Patient ID: Kendra [name], female   DOB: 35 y.o.   MRN: [id number]   HPI 35 year old female presents the office today for concerns of left foot discomfort. She states it is worse in the top of the foot and radiates to the lower leg. She has numbness into her toes with issues noticed swelling to both of her feet. She has not been significant swelling to her leg and she denies any claudication symptoms. No open lesions she reports. This has been a chronic issue she has seen other physicians for this. She states that she simply 6 or 7 doctors. She is seeing orthopedics, physical therapy and other podiatrist. She states that she is only had an MRI of her ankle performed and ask about MRI of her foot. She is also seeing [place] vein specialist for the swelling.   Review of Systems  All other systems reviewed and are negative.  Past Medical History:  Diagnosis Date  . Asthma   . Convulsions/seizures ([place]) 07/27/2016   most recent 10/17/17  . H/O: C-section   . Headache   . Lipoma   . Lipoma of LUQ abdomen, s/p removal 06/27/2012 05/24/2012  . Meningioma ([place]) 07/27/2016   Anterior fossa    Past Surgical History:  Procedure Laterality Date  . APPLICATION OF CRANIAL NAVIGATION N/A 12/10/2016   Procedure: APPLICATION OF CRANIAL NAVIGATION;  Surgeon: Consuella Lose, MD;  Location: [facility];  Service: Neurosurgery;  Laterality: N/A;  . BRAIN SURGERY    . BREAST SURGERY  12/30/11   breast reduction  . CESAREAN SECTION  09/09/2010   [place]  . CESAREAN SECTION N/A 01/13/2015   Procedure: CESAREAN SECTION;  Surgeon: Janyth Pupa, DO;  Location: [facility];  Service: Obstetrics;  Laterality: N/A;  EDD 01/18/15 would like [place] dressing  . CHOLECYSTECTOMY N/A 11/23/2016   Procedure: LAPAROSCOPIC CHOLECYSTECTOMY WITH INTRAOPERATIVE CHOLANGIOGRAM;  Surgeon: Donnie Mesa, MD;  Location: [facility];  Service: General;  Laterality: N/A;  . CRANIOTOMY N/A 12/10/2016   Procedure: BICORONAL CRANIOTOMY FOR  RESECTION OF TUMOR;  Surgeon: Consuella Lose, MD;  Location: [facility];  Service: Neurosurgery;  Laterality: N/A;  BICORONAL CRANIOTOMY FOR RESECTION OF TUMOR   . LYMPH NODE DISSECTION  2013   Abdominal     Current Outpatient Medications:  .  acetaminophen (TYLENOL) 500 MG tablet, Take 1,000 mg by mouth every 6 (six) hours as needed (for pain/headaches.)., Disp: , Rfl:  .  albuterol (VENTOLIN HFA) 108 (90 Base) MCG/ACT inhaler, Inhale 1-2 puffs into the lungs every 6 (six) hours as needed for wheezing or shortness of breath., Disp: 1 each, Rfl: 0 .  albuterol (VENTOLIN HFA) 108 (90 Base) MCG/ACT inhaler, Inhale 2 puffs into the lungs every 6 (six) hours as needed for wheezing or shortness of breath., Disp: 1 each, Rfl: 0 .  benzonatate (TESSALON) 100 MG capsule, Take 1 capsule (100 mg total) by mouth every 8 (eight) hours., Disp: 21 capsule, Rfl: 0 .  budesonide-formoterol (SYMBICORT) 160-4.5 MCG/ACT inhaler, Inhale 2 puffs into the lungs 2 (two) times daily., Disp: 1 each, Rfl: 0 .  cetirizine (ZYRTEC ALLERGY) 10 MG tablet, Take 1 tablet (10 mg total) by mouth daily., Disp: 30 tablet, Rfl: 2 .  diphenhydrAMINE (BENADRYL) 25 mg capsule, Take 1 capsule (25 mg total) by mouth every 6 (six) hours as needed. (Patient taking differently: Take 25 mg by mouth every 6 (six) hours as needed for allergies.), Disp: 30 capsule, Rfl: 0 .  EPINEPHrine 0.3 mg/0.3 mL  IJ SOAJ injection, Inject 0.3 mg into the skin once., Disp: , Rfl:  .  fluticasone (FLONASE) 50 MCG/ACT nasal spray, Place 1 spray into both nostrils daily., Disp: 16 g, Rfl: 0 .  gabapentin (NEURONTIN) 100 MG capsule, Take 1 capsule (100 mg total) by mouth at bedtime., Disp: 90 capsule, Rfl: 3 .  lamoTRIgine (LAMICTAL) 100 MG tablet, TAKE 2 TABLETS(200 MG) BY MOUTH TWICE DAILY, Disp: 120 tablet, Rfl: 5 .  levonorgestrel (MIRENA) 20 MCG/24HR IUD, 1 each by Intrauterine route once., Disp: , Rfl:  .  LINZESS 290 MCG CAPS capsule, Take 290 mcg by mouth  daily as needed (constipation). , Disp: , Rfl: 3 .  pantoprazole (PROTONIX) 40 MG tablet, Take 1 tablet (40 mg) twice daily for two weeks. Then take 1 tablet (40 mg) daily., Disp: 90 tablet, Rfl: 3 .  pregabalin (LYRICA) 25 MG capsule, Take 1 capsule (25 mg total) by mouth at bedtime., Disp: 30 capsule, Rfl: 5 .  promethazine-dextromethorphan (PROMETHAZINE-DM) 6.25-15 MG/5ML syrup, Take 5 mLs by mouth 4 (four) times daily as needed for cough., Disp: 118 mL, Rfl: 0  Allergies  Allergen Reactions  . Amoxicillin   . Asa Buff (Mag [Buffered Aspirin] Anaphylaxis  . Erythromycin Anaphylaxis  . Iodine Anaphylaxis, Hives and Other (See Comments)  . Peanut-Containing Drug Products Anaphylaxis and Hives    Pt is allergic to peanut oil  . Shellfish Allergy Anaphylaxis, Hives, Swelling and Other (See Comments)    Pt throat swells  . Other Other (See Comments)  . Ibuprofen Rash         Objective:  Physical Exam  General: AAO x3, NAD  Dermatological: There are no open lesions identified at this time. No erythema or warmth.  Vascular: Dorsalis Pedis artery and Posterior Tibial artery pedal pulses are 2/4 bilateral with immedate capillary fill time.  There is no pain with calf compression, swelling, warmth, erythema.   Neruologic: Grossly intact via light touch bilateral. Significant degree numbness to the toes. Sensation appears to be intact today with Thornell Mule monofilament.  Musculoskeletal: Chronic appearing edema mostly to the forefoot area on the left side. Mild tenderness over the forefoot. There is no areas of fluctuation crepitation. Flexor, extensor tendons appear to be intact. Muscular strength 5/5 in all groups tested bilateral.  Gait: Unassisted, Nonantalgic.       Assessment:   35 year old female with left chronic foot swelling, discomfort     Plan:  -Treatment options discussed including all alternatives, risks, and complications -Etiology of symptoms were  discussed -X-rays were obtained and reviewed with the patient. There is no evidence of acute fracture or stress fracture identified today. -No order MRI of the left foot. Given the chronic nature of this and she is seen several physicians for this will order MRI to rule out any underlying soft tissue pathology that could be causing the swelling. Also ordered blood work including sed rate, rheumatoid factor, HLA-B27, CRP, ANA. -Continue elevation, compression socks.  Trula Slade DPM

## 2020-10-07 NOTE — Telephone Encounter (Signed)
[facility] called stating the order for the following patient needs to be changed to with/without contrast, Please Advise

## 2020-10-07 NOTE — Addendum Note (Signed)
Addended by: Celesta Gentile R on: 10/07/2020 05:19 PM   Modules accepted: Orders

## 2020-10-07 NOTE — Telephone Encounter (Signed)
Order changed.

## 2020-10-14 ENCOUNTER — Telehealth: Payer: Self-pay | Admitting: Family Medicine

## 2020-10-14 NOTE — Telephone Encounter (Signed)
Cld pt to schedule ED f/u with Dr.Schmitz, pt confused says was advised to f/u with her Primary Care Provider,states saw PCP who has referred her to PT already.  --FYI  --glh

## 2020-10-15 ENCOUNTER — Other Ambulatory Visit (HOSPITAL_COMMUNITY): Payer: Medicaid Other

## 2020-10-15 ENCOUNTER — Encounter (HOSPITAL_COMMUNITY): Payer: Self-pay | Admitting: Cardiology

## 2020-10-17 ENCOUNTER — Telehealth: Payer: Medicaid Other | Admitting: Physician Assistant

## 2020-10-21 ENCOUNTER — Ambulatory Visit: Payer: Medicaid Other | Admitting: Physical Therapy

## 2020-10-21 ENCOUNTER — Ambulatory Visit (HOSPITAL_BASED_OUTPATIENT_CLINIC_OR_DEPARTMENT_OTHER): Payer: No Typology Code available for payment source | Admitting: Physical Therapy

## 2020-10-21 ENCOUNTER — Other Ambulatory Visit: Payer: Self-pay

## 2020-10-21 ENCOUNTER — Ambulatory Visit (HOSPITAL_BASED_OUTPATIENT_CLINIC_OR_DEPARTMENT_OTHER): Payer: Medicaid Other | Attending: Family Medicine | Admitting: Physical Therapy

## 2020-10-21 DIAGNOSIS — M6281 Muscle weakness (generalized): Secondary | ICD-10-CM

## 2020-10-21 DIAGNOSIS — M25511 Pain in right shoulder: Secondary | ICD-10-CM | POA: Diagnosis not present

## 2020-10-21 NOTE — Therapy (Signed)
Kendra [name], [place], [address] Phone: [phone]   Fax:  [phone]  Physical Therapy Evaluation  Patient Details   Name: Kendra [name] MRN: [id number] Date of Birth: 04-08-1986 Referring Provider (PT): Gentry Fitz, MD   Encounter Date: 10/21/2020    Past Medical History:  Diagnosis Date  . Asthma   . Convulsions/seizures ([place]) 07/27/2016   most recent 10/17/17  . H/O: C-section   . Headache   . Lipoma   . Lipoma of LUQ abdomen, s/p removal 06/27/2012 05/24/2012  . Meningioma ([place]) 07/27/2016   Anterior fossa    Past Surgical History:  Procedure Laterality Date  . APPLICATION OF CRANIAL NAVIGATION N/A 12/10/2016   Procedure: APPLICATION OF CRANIAL NAVIGATION;  Surgeon: Consuella Lose, MD;  Location: [facility];  Service: Neurosurgery;  Laterality: N/A;  . BRAIN SURGERY    . BREAST SURGERY  12/30/11   breast reduction  . CESAREAN SECTION  09/09/2010   [place]  . CESAREAN SECTION N/A 01/13/2015   Procedure: CESAREAN SECTION;  Surgeon: Janyth Pupa, DO;  Location: [facility];  Service: Obstetrics;  Laterality: N/A;  EDD 01/18/15 would like [place] dressing  . CHOLECYSTECTOMY N/A 11/23/2016   Procedure: LAPAROSCOPIC CHOLECYSTECTOMY WITH INTRAOPERATIVE CHOLANGIOGRAM;  Surgeon: Donnie Mesa, MD;  Location: [facility];  Service: General;  Laterality: N/A;  . CRANIOTOMY N/A 12/10/2016   Procedure: BICORONAL CRANIOTOMY FOR RESECTION OF TUMOR;  Surgeon: Consuella Lose, MD;  Location: [facility];  Service: Neurosurgery;  Laterality: N/A;  BICORONAL CRANIOTOMY FOR RESECTION OF TUMOR   . LYMPH NODE DISSECTION  2013   Abdominal    There were no vitals filed for this visit.    Subjective Assessment - 10/21/20 1529    Subjective Pt reports she had a seizure at work and fell off the couch. Pt hit her head and R shoulder against a bed pole on 10/01/2020. Pt reports small right shoulder fracture. Pt reports pain with small  movements especially with pulling and pushing. 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 Pt still has a sling but has been cleared to go without sling. Pt states she recognizes that she's moving her shoulder probably too much.    Pertinent History PMH: COVID, seizures, meningioma, C-section, lipoma removal, obesity    Limitations Lifting    Diagnostic tests X-ray R shoulder: Small acromium fracture; CTH: (-) acute    Patient Stated Goals Improve pain    Currently in Pain? Yes    Pain Score 8     Pain Location Shoulder    Pain Orientation Right    Pain Descriptors / Indicators Tightness    Pain Type Acute pain    Pain Radiating Towards Deltoid    Pain Frequency Constant    Aggravating Factors  Pressure/sleeping on it, pushing/pulling    Pain Relieving Factors Rest    Effect of Pain on Daily Activities Pain with daily activities              [facility] PT Assessment - 10/21/20 0001      Assessment   Medical Diagnosis S46.911A (ICD-10-CM) - Right shoulder strain    Referring Provider (PT) Gentry Fitz, MD    Onset Date/Surgical Date 10/01/20    Hand Dominance Right    Prior Therapy None  Precautions   Precautions None      Restrictions   Weight Bearing Restrictions No      Balance Screen   Has the patient fallen in the past 6 months Yes    How many times? 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      Observation/Other Assessments   Focus on Therapeutic Outcomes (FOTO)  n/a      Sensation   Light Touch Appears Intact      ROM / Strength   AROM  / PROM / Strength AROM      AROM   Overall AROM Comments R shoulder AROM WFL but has pain with mid to end range movement      Strength   Overall Strength Comments L grip 55 to 60#; R grip 15#    Right/Left Elbow Right    Right Elbow Flexion 4+/5    Right Elbow Extension 4+/5    Right/Left Wrist Right    Right Wrist Flexion 3+/5    Right Wrist Extension 3+/5    Right/Left hand Right                      Objective measurements completed on examination: See above findings.               PT Education - 10/21/20 1630    Education Details Discussed avoiding upper trap and deltoid motion to decrease pull on her acromion. Discussed using sling as needed out of the house. Discussed using ice. Discussed safe compensatory movements and shoulder AROM until further bone healing occurs.    Person(s) Educated Patient;Spouse    Methods Explanation;Demonstration;Verbal cues    Comprehension Verbalized understanding;Returned demonstration;Verbal cues required;Need further instruction            PT Short Term Goals - 10/21/20 1643      PT SHORT TERM GOAL #1   Title Pt will be independent with initial HEP    Time 4    Period Weeks    Status New    Target Date 11/18/20      PT SHORT TERM GOAL #2   Title Pt will have pain free full shoulder AROM    Time 4    Period Weeks    Status New    Target Date 11/18/20      PT SHORT TERM GOAL #3   Title Pt will be able to verbalize motions to decrease pain on R acromion    Time 4    Period Weeks    Status New    Target Date 11/18/20             PT Long Term Goals - 10/21/20 1643      PT LONG TERM GOAL #1   Title Pt will be independent with final shoulder strengthening HEP    Time 8    Period Weeks    Status New    Target Date 12/16/20      PT LONG TERM GOAL #2   Title Pt will have equal L and R hand grip strength    Baseline 55-60# on L, 15# on R  Time 8    Period Weeks    Status New    Target Date  12/16/20      PT LONG TERM GOAL #3   Title Pt will demo good mechanics for safe return to work    Time 8    Period Weeks    Status New    Target Date 12/16/20                  Plan - 10/21/20 1635    Clinical Impression Statement Ms. Limpert is a 35 y/o F presenting to OPPT due to complaint of R shoulder pain with movement and during sleep. Pt with history of seizure leading to fall and small R acromium fracture based on x-ray. On assessment, pt has full AROM of shoulder but does feel pain with movement and demos decreased R grip vs L grip strength. Evaluation spent discussing decreasing use of muscles that insert/originate along her acromion (i.e. upper trap and deltoid)  and how to compensate with bicep, triceps, and lats. Asserted with pt importance of maintaining shoulder AAROM with table exercises. Pt would benefit from additional visits of PT to continue to strengthen and optimize motion as pt heals for safe return to work.    Personal Factors and Comorbidities Age;Profession;Time since onset of injury/illness/exacerbation    Examination-Activity Limitations Sleep;Reach Overhead;Caring for Others;Lift    Examination-Participation Restrictions Occupation;Community Activity;Yard Work    Stability/Clinical Decision Making Stable/Uncomplicated    Designer, jewellery Low    Rehab Potential Good    PT Frequency --   Every other week for 2 visits; switch to 1x/wk for 4 more weeks   PT Duration 8 weeks    PT Treatment/Interventions Iontophoresis 4mg /ml Dexamethasone;Cryotherapy;ADLs/Self Care Home Management;Moist Heat;Ultrasound;Functional mobility training;Therapeutic activities;Therapeutic exercise;Neuromuscular re-education;Manual techniques;Patient/family education;Passive range of motion;Taping    PT Next Visit Plan Assess current state of R shoulder motion and AAROM. How were gripping exercises? Continue to progress R grip strength and R shoulder ROM as able.    PT Home  Exercise Plan Table shoulder flexion AAROM with wash rag x10, gripping multiple times through the day    Consulted and Agree with Plan of Care Patient           Patient will benefit from skilled therapeutic intervention in order to improve the following deficits and impairments:  Pain,Impaired UE functional use,Decreased strength,Decreased mobility  Visit Diagnosis: Acute pain of right shoulder  Muscle weakness (generalized)     Problem List Patient Active Problem List   Diagnosis Date Noted  . Convulsions/seizures ([place]) 07/27/2016  . Meningioma ([place]) 07/27/2016  . Common migraine with intractable migraine 07/27/2016  . Status post repeat low transverse cesarean section 01/14/2015  . Lipoma of LUQ abdomen, s/p removal 06/27/2012 05/24/2012  . 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 Chlamydia infection 12/13/2011  . 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 [address] [place] PT, DPT 10/21/2020, 4:45 PM  [facility] [address] [place], [place], [address] Phone: [phone]   Fax:  [phone]  Name: TABOR BARTRAM MRN: [id number] Date of Birth: [birth year]

## 2020-10-25 ENCOUNTER — Ambulatory Visit
Admission: RE | Admit: 2020-10-25 | Discharge: 2020-10-25 | Disposition: A | Discharge status: Home / Self Care | Payer: Medicaid Other | Source: Ambulatory Visit | Attending: Podiatry | Admitting: Podiatry

## 2020-10-25 ENCOUNTER — Other Ambulatory Visit: Payer: Self-pay

## 2020-10-25 DIAGNOSIS — S96912A Strain of unspecified muscle and tendon at ankle and foot level, left foot, initial encounter: Secondary | ICD-10-CM

## 2020-10-25 DIAGNOSIS — M79672 Pain in left foot: Secondary | ICD-10-CM

## 2020-10-25 MED ORDER — GADOBENATE DIMEGLUMINE 529 MG/ML IV SOLN
20.0000 mL | Freq: Once | INTRAVENOUS | Status: AC | PRN
  Administered 2020-10-25: 20 mL via INTRAVENOUS

## 2020-10-29 ENCOUNTER — Encounter: Payer: Self-pay | Admitting: Adult Health

## 2020-10-31 ENCOUNTER — Telehealth: Payer: Self-pay | Admitting: Adult Health

## 2020-10-31 ENCOUNTER — Ambulatory Visit: Payer: No Typology Code available for payment source | Admitting: Physical Therapy

## 2020-10-31 NOTE — Telephone Encounter (Signed)
..   Pt understands that although there may be some limitations with this type of visit, we will take all precautions to reduce any security or privacy concerns.  Pt understands that this will be treated like an in office visit and we will file with pt's insurance, and there may be a patient responsible charge related to this service. ? ?

## 2020-11-03 ENCOUNTER — Telehealth (INDEPENDENT_AMBULATORY_CARE_PROVIDER_SITE_OTHER): Payer: Medicaid Other | Admitting: Adult Health

## 2020-11-03 DIAGNOSIS — Z5181 Encounter for therapeutic drug level monitoring: Secondary | ICD-10-CM | POA: Diagnosis not present

## 2020-11-03 DIAGNOSIS — R569 Unspecified convulsions: Secondary | ICD-10-CM | POA: Diagnosis not present

## 2020-11-03 NOTE — Progress Notes (Signed)
PATIENT: Kendra [name] DOB: 02/09/1986  REASON FOR VISIT: follow up HISTORY FROM: patient  Virtual Visit via Video Note  I connected with Kendra [name] on 11/03/20 at  9:00 AM EST by a video enabled telemedicine application located remotely at [facility] and verified that I am speaking with the correct person using two identifiers who was located at their own home.   I discussed the limitations of evaluation and management by telemedicine and the availability of in person appointments. The patient expressed understanding and agreed to proceed.   PATIENT: Kendra [name] DOB: [birth year]  REASON FOR VISIT: follow up HISTORY FROM: patient  HISTORY OF PRESENT ILLNESS: Today 11/03/20:  [name] is a 35 year old female with a history of seizures.  She returns today for follow-up.  She states on February 1 she was taken to the hospital for seizure event.  She states that she was at a client's house and had a seizure event and lost consciousness.  When she fell she did hit her right shoulder and head.  Reports that at the ED they did imaging.  CT of the head was unremarkable.  She does have a fracture in the right shoulder.  She states that since the event she has been having pain in the left side of the head.  She states that it feels muscular but she did not hit her head on the side.  The patient reports that she did not miss any medication.  However I read the ED note and it appears the patient had Covid 2 weeks prior to this event.  She reported to the ED that she had missed some doses of her medication while she had Covid.  Today the patient does not recall that.  She joins me today for a virtual visit.  HISTORY 05/07/20:  Ms. Kendra [name] is a 35 year old female with a history of seizures, daily headaches and memory disturbance.  She returns today for follow-up.  She denies any seizure events.  Continues on Lamictal.  Initially she reports that she is taking gabapentin at  bedtime and it has been beneficial for headaches however according to my chart message we can switch her to Lyrica.  She is unsure if she is taking gabapentin or Lyrica.  She will check her prescriptions when she get home.  She feels that her memory has remained stable.  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  She returns today for an evaluation.  REVIEW OF SYSTEMS: Out of a complete 14 system review of symptoms, the patient complains only of the following symptoms, and all other reviewed systems are negative.  ALLERGIES: Allergies  Allergen Reactions   Amoxicillin    Asa Buff (Mag [Buffered Aspirin] Anaphylaxis   Erythromycin Anaphylaxis   Iodine Anaphylaxis, Hives and Other (See Comments)   Peanut-Containing Drug Products Anaphylaxis and Hives    Pt is allergic to peanut oil   Shellfish Allergy Anaphylaxis, Hives, Swelling and Other (See Comments)    Pt throat swells   Other Other (See Comments)   Ibuprofen Rash    HOME MEDICATIONS: Outpatient Medications Prior to Visit  Medication Sig Dispense Refill   acetaminophen (TYLENOL) 500 MG tablet Take 1,000 mg by mouth every 6 (six) hours as needed (for pain/headaches.).     albuterol (VENTOLIN HFA) 108 (90 Base) MCG/ACT inhaler Inhale 1-2 puffs into the lungs every 6 (six) hours as needed for wheezing or shortness of breath.  1 each 0   albuterol (VENTOLIN HFA) 108 (90 Base) MCG/ACT inhaler Inhale 2 puffs into the lungs every 6 (six) hours as needed for wheezing or shortness of breath. 1 each 0   benzonatate (TESSALON) 100 MG capsule Take 1 capsule (100 mg total) by mouth every 8 (eight) hours. 21 capsule 0   budesonide-formoterol (SYMBICORT) 160-4.5 MCG/ACT inhaler Inhale 2 puffs into the lungs 2 (two) times daily. 1 each 0   cetirizine (ZYRTEC ALLERGY) 10 MG tablet Take 1 tablet (10 mg total) by mouth daily. 30 tablet 2   diphenhydrAMINE (BENADRYL) 25 mg capsule Take 1 capsule (25 mg total) by mouth  every 6 (six) hours as needed. (Patient taking differently: Take 25 mg by mouth every 6 (six) hours as needed for allergies.) 30 capsule 0   EPINEPHrine 0.3 mg/0.3 mL IJ SOAJ injection Inject 0.3 mg into the skin once.     fluticasone (FLONASE) 50 MCG/ACT nasal spray Place 1 spray into both nostrils daily. 16 g 0   gabapentin (NEURONTIN) 100 MG capsule Take 1 capsule (100 mg total) by mouth at bedtime. 90 capsule 3   lamoTRIgine (LAMICTAL) 100 MG tablet TAKE 2 TABLETS(200 MG) BY MOUTH TWICE DAILY 120 tablet 5   levonorgestrel (MIRENA) 20 MCG/24HR IUD 1 each by Intrauterine route once.     LINZESS 290 MCG CAPS capsule Take 290 mcg by mouth daily as needed (constipation).   3   pantoprazole (PROTONIX) 40 MG tablet Take 1 tablet (40 mg) twice daily for two weeks. Then take 1 tablet (40 mg) daily. 90 tablet 3   pregabalin (LYRICA) 25 MG capsule Take 1 capsule (25 mg total) by mouth at bedtime. 30 capsule 5   promethazine-dextromethorphan (PROMETHAZINE-DM) 6.25-15 MG/5ML syrup Take 5 mLs by mouth 4 (four) times daily as needed for cough. 118 mL 0   No facility-administered medications prior to visit.    PAST MEDICAL HISTORY: Past Medical History:  Diagnosis Date   Asthma    Convulsions/seizures ([place]) 07/27/2016   most recent 10/17/17   H/O: C-section    Headache    Lipoma    Lipoma of LUQ abdomen, s/p removal 06/27/2012 05/24/2012   Meningioma ([place]) 07/27/2016   Anterior fossa    PAST SURGICAL HISTORY: Past Surgical History:  Procedure Laterality Date   APPLICATION OF CRANIAL NAVIGATION N/A 12/10/2016   Procedure: APPLICATION OF CRANIAL NAVIGATION;  Surgeon: Consuella Lose, MD;  Location: [facility];  Service: Neurosurgery;  Laterality: N/A;   BRAIN SURGERY     BREAST SURGERY  12/30/11   breast reduction   CESAREAN SECTION  09/09/2010   [place]   CESAREAN SECTION N/A 01/13/2015   Procedure: CESAREAN SECTION;  Surgeon: Janyth Pupa, DO;  Location: [facility];  Service:  Obstetrics;  Laterality: N/A;  EDD 01/18/15 would like Pico dressing   CHOLECYSTECTOMY N/A 11/23/2016   Procedure: LAPAROSCOPIC CHOLECYSTECTOMY WITH INTRAOPERATIVE CHOLANGIOGRAM;  Surgeon: Donnie Mesa, MD;  Location: [place];  Service: General;  Laterality: N/A;   CRANIOTOMY N/A 12/10/2016   Procedure: BICORONAL CRANIOTOMY FOR RESECTION OF TUMOR;  Surgeon: Consuella Lose, MD;  Location: [facility];  Service: Neurosurgery;  Laterality: N/A;  BICORONAL CRANIOTOMY FOR RESECTION OF TUMOR    LYMPH NODE DISSECTION  2013   Abdominal    FAMILY HISTORY: Family History  Problem Relation Age of Onset   Kidney disease Mother        kidney stones   Cancer Maternal Grandmother        breast  Cancer Maternal Grandfather        Colon   Deep vein thrombosis Maternal Grandfather        in throat   Healthy Father    Cancer Maternal Aunt        breast   Heart disease Maternal Uncle    Stroke Maternal Uncle    Liver cancer Maternal Uncle    Diabetes Paternal Grandfather     SOCIAL HISTORY: Social History   Socioeconomic History   Marital status: Single    Spouse name: Not on file   Number of children: 2   Years of education: 12   Highest education level: Not on file  Occupational History   Occupation: [employer]  Tobacco Use   Smoking status: Never Smoker   Smokeless tobacco: Never Used  Scientific laboratory technician Use: Never used  Substance and Sexual Activity   Alcohol use: No   Drug use: No   Sexual activity: Yes    Partners: Male    Birth control/protection: I.U.D.  Other Topics Concern   Not on file  Social History Narrative   Lives at home w/ her 2 children   Right-handed   Caffeine: couple of cups per month   Social Determinants of Health   Financial Resource Strain: Not on file  Food Insecurity: Not on file  Transportation Needs: Not on file  Physical Activity: Not on file  Stress: Not on file  Social Connections: Not on file  Intimate Partner Violence:  Not on file      PHYSICAL EXAM Generalized: Well developed, in no acute distress   Neurological examination  Mentation: Alert oriented to time, place, history taking. Follows all commands speech and language fluent Cranial nerve II-XII:Extraocular movements were full. Facial symmetry noted. uvula tongue midline. Head turning and shoulder shrug  were normal and symmetric. Motor: Good strength throughout subjectively per patient Sensory: Sensory testing is intact to soft touch on all 4 extremities subjectively per patient Coordination: Cerebellar testing reveals good finger-nose-finger  Gait and station: Patient is able to stand from a seated position. gait is normal.  Reflexes: UTA  DIAGNOSTIC DATA (LABS, IMAGING, TESTING) - I reviewed patient records, labs, notes, testing and imaging myself where available.  Lab Results  Component Value Date   WBC 10.9 (H) 09/30/2020   HGB 13.0 09/30/2020   HCT 38.7 09/30/2020   MCV 92.1 09/30/2020   PLT 447 (H) 09/30/2020      Component Value Date/Time   NA 134 (L) 09/30/2020 1816   NA 138 09/19/2019 1407   K 4.0 09/30/2020 1816   CL 100 09/30/2020 1816   CO2 26 09/30/2020 1816   GLUCOSE 102 (H) 09/30/2020 1816   BUN 10 09/30/2020 1816   BUN 10 09/19/2019 1407   CREATININE 0.76 09/30/2020 1816   CALCIUM 8.9 09/30/2020 1816   PROT 6.6 09/19/2019 1407   ALBUMIN 3.9 09/19/2019 1407   AST 13 09/19/2019 1407   ALT 9 09/19/2019 1407   ALKPHOS 81 09/19/2019 1407   BILITOT 0.2 09/19/2019 1407   GFRNONAA >60 09/30/2020 1816   GFRAA 115 09/19/2019 1407      ASSESSMENT AND PLAN 35 y.o. year old female  has a past medical history of Asthma, Convulsions/seizures ([place]) (07/27/2016), H/O: C-section, Headache, Lipoma, Lipoma of LUQ abdomen, s/p removal 06/27/2012 (05/24/2012), and Meningioma ([place]) (07/27/2016). here with:  1.  Seizures  --Continue Lamictal 200 mg twice a day --I have ordered blood work to check Lamictal level, CBC and  CMP  patient is encouraged to come have this blood work done this week. --Repeat EEG --Patient is advised that she potentially could have had a breakthrough seizure event due to Covid infection and possibly missed medication.  I would like to check drug levels and lab work before making any changes to her medication. --The patient should not be operating a motor vehicle for 6 months.  She is encouraged to take her medication as prescribed. --She will follow-up in the office in 1 month.    I spent 30 minutes of face-to-face and non-face-to-face time with patient.  This included previsit chart review, lab review, study review, order entry, electronic health record documentation, patient education.    Ward Givens, MSN, NP-C 11/03/2020, 8:47 AM [facility] [address], [place], [place] [address] [phone]

## 2020-11-03 NOTE — Telephone Encounter (Signed)
Seizures.

## 2020-11-03 NOTE — Progress Notes (Signed)
I have read the note, and I agree with the clinical assessment and plan.  Jeromy Borcherding K Bevan Disney   

## 2020-11-04 ENCOUNTER — Ambulatory Visit (HOSPITAL_COMMUNITY): Payer: Medicaid Other | Attending: Cardiovascular Disease

## 2020-11-04 ENCOUNTER — Other Ambulatory Visit: Payer: Self-pay

## 2020-11-04 ENCOUNTER — Ambulatory Visit (HOSPITAL_BASED_OUTPATIENT_CLINIC_OR_DEPARTMENT_OTHER): Payer: Medicaid Other | Attending: Family Medicine | Admitting: Physical Therapy

## 2020-11-04 DIAGNOSIS — R0602 Shortness of breath: Secondary | ICD-10-CM | POA: Insufficient documentation

## 2020-11-04 DIAGNOSIS — M6281 Muscle weakness (generalized): Secondary | ICD-10-CM | POA: Diagnosis present

## 2020-11-04 DIAGNOSIS — M25511 Pain in right shoulder: Secondary | ICD-10-CM | POA: Insufficient documentation

## 2020-11-04 LAB — ECHOCARDIOGRAM COMPLETE
Area-P 1/2: 3.72 cm2
S' Lateral: 2.9 cm

## 2020-11-04 NOTE — Progress Notes (Signed)
Virtual Visit via Video Note   This visit type was conducted due to national recommendations for restrictions regarding the COVID-19 Pandemic (e.g. social distancing) in an effort to limit this patient's exposure and mitigate transmission in our community.  Due to her co-morbid illnesses, this patient is at least at moderate risk for complications without adequate follow up.  This format is felt to be most appropriate for this patient at this time.  All issues noted in this document were discussed and addressed.  A limited physical exam was performed with this format.  Please refer to the patient's chart for her consent to telehealth for [facility].       Date:  11/11/2020   ID:  Kendra [name], DOB 29-Mar-1986, MRN [id number] The patient was identified using 2 identifiers.  Patient Location: Home Provider Location: Office/Clinic   PCP:  Kelton Pillar, [place]  Cardiologist:  Fransico Him, MD  Advanced Practice Provider:  No care team member to display Electrophysiologist:  None   :[id number]}   Evaluation Performed:  Follow-Up Visit  Chief Complaint:  Short of breath  History of Present Illness:    Kendra [name] is a 35 y.o. female with with a hx of asthma, seizures and meningioma, GERD and Obesity.  She saw Dr. Radford Pax 09/05/20 with palpitations, dyspnea and chest pain. BP and HR up and allergist wanted to add another inhaler but wanted cardiology eval first. Holter monitor was ordered and normal. Echo 11/04/20:normal.   Patient says she gets short of breath going up stairs. Uses proair and it helps. Says she needs another rescue inhaler. Protonix has helped her GERD. Has appt for healthy weight and wellness.    The patient does not have symptoms concerning for COVID-19 infection (fever, chills, cough, or new shortness of breath).    Past Medical History:  Diagnosis Date  . Asthma   . Convulsions/seizures ([place]) 07/27/2016   most recent  10/17/17  . H/O: C-section   . Headache   . Lipoma   . Lipoma of LUQ abdomen, s/p removal 06/27/2012 05/24/2012  . Meningioma ([place]) 07/27/2016   Anterior fossa   Past Surgical History:  Procedure Laterality Date  . APPLICATION OF CRANIAL NAVIGATION N/A 12/10/2016   Procedure: APPLICATION OF CRANIAL NAVIGATION;  Surgeon: Consuella Lose, MD;  Location: [place];  Service: Neurosurgery;  Laterality: N/A;  . BRAIN SURGERY    . BREAST SURGERY  12/30/11   breast reduction  . CESAREAN SECTION  09/09/2010   [place]  . CESAREAN SECTION N/A 01/13/2015   Procedure: CESAREAN SECTION;  Surgeon: Janyth Pupa, DO;  Location: [facility];  Service: Obstetrics;  Laterality: N/A;  EDD 01/18/15 would like [place] dressing  . CHOLECYSTECTOMY N/A 11/23/2016   Procedure: LAPAROSCOPIC CHOLECYSTECTOMY WITH INTRAOPERATIVE CHOLANGIOGRAM;  Surgeon: Donnie Mesa, MD;  Location: [place];  Service: General;  Laterality: N/A;  . CRANIOTOMY N/A 12/10/2016   Procedure: BICORONAL CRANIOTOMY FOR RESECTION OF TUMOR;  Surgeon: Consuella Lose, MD;  Location: [place] [facility];  Service: Neurosurgery;  Laterality: N/A;  BICORONAL CRANIOTOMY FOR RESECTION OF TUMOR   . LYMPH NODE DISSECTION  2013   Abdominal     Current Meds  Medication Sig  . acetaminophen (TYLENOL) 500 MG tablet Take 1,000 mg by mouth every 6 (six) hours as needed (for pain/headaches.).  Marland Kitchen albuterol (VENTOLIN HFA) 108 (90 Base) MCG/ACT inhaler Inhale 2 puffs into the lungs every 6 (six) hours as needed for wheezing or shortness of  breath.  . budesonide-formoterol (SYMBICORT) 160-4.5 MCG/ACT inhaler Inhale 2 puffs into the lungs 2 (two) times daily.  . cetirizine (ZYRTEC ALLERGY) 10 MG tablet Take 1 tablet (10 mg total) by mouth daily.  . diphenhydrAMINE (BENADRYL) 25 mg capsule Take 1 capsule (25 mg total) by mouth every 6 (six) hours as needed. (Patient taking differently: Take 25 mg by mouth every 6 (six) hours as needed for allergies.)  . EPINEPHrine 0.3 mg/0.3 mL IJ SOAJ  injection Inject 0.3 mg into the skin once.  . fluticasone (FLONASE) 50 MCG/ACT nasal spray Place 1 spray into both nostrils daily.  Marland Kitchen gabapentin (NEURONTIN) 100 MG capsule Take 1 capsule (100 mg total) by mouth at bedtime.  . lamoTRIgine (LAMICTAL) 100 MG tablet TAKE 2 TABLETS(200 MG) BY MOUTH TWICE DAILY  . levonorgestrel (MIRENA) 20 MCG/24HR IUD 1 each by Intrauterine route once.  Marland Kitchen LINZESS 290 MCG CAPS capsule Take 290 mcg by mouth daily as needed (constipation).   . pantoprazole (PROTONIX) 40 MG tablet Take 1 tablet (40 mg) twice daily for two weeks. Then take 1 tablet (40 mg) daily.     Allergies:   Amoxicillin, Asa buff (mag [buffered aspirin], Erythromycin, Iodine, Peanut-containing drug products, Shellfish allergy, Other, and Ibuprofen   Social History   Tobacco Use  . Smoking status: Never Smoker  . Smokeless tobacco: Never Used  Vaping Use  . Vaping Use: Never used  Substance Use Topics  . Alcohol use: No  . Drug use: No     Family Hx: The patient's family history includes Cancer in her maternal aunt, maternal grandfather, and maternal grandmother; Deep vein thrombosis in her maternal grandfather; Diabetes in her paternal grandfather; Healthy in her father; Heart disease in her maternal uncle; Kidney disease in her mother; Liver cancer in her maternal uncle; Stroke in her maternal uncle.  ROS:   Please see the history of present illness.      All other systems reviewed and are negative.   Prior CV studies:   The following studies were reviewed today:  Echo 11/04/20: IMPRESSIONS     1. Left ventricular ejection fraction, by estimation, is 60 to 65%. The  left ventricle has normal function. The left ventricle has no regional  wall motion abnormalities. Left ventricular diastolic parameters were  normal.   2. Right ventricular systolic function is normal. The right ventricular  size is normal. There is normal pulmonary artery systolic pressure. The  estimated right  ventricular systolic pressure is 49.1 mmHg.   3. The mitral valve is grossly normal. No evidence of mitral valve  regurgitation. No evidence of mitral stenosis.   4. The aortic valve is tricuspid. Aortic valve regurgitation is not  visualized. No aortic stenosis is present.   5. The inferior vena cava is normal in size with greater than 50%  respiratory variability, suggesting right atrial pressure of 3 mmHg.   Conclusion(s)/Recommendation(s): Normal biventricular function without  evidence of hemodynamically significant valvular heart disease.    Holter 10/14/20 Study Highlights   Predominant rhythm is normal sinus rhythm with average heart rate 94bpm. The heart rate ranged from 60 to 161bpm.     Labs/Other Tests and Data Reviewed:    EKG:  No ECG reviewed.  Recent Labs: 09/30/2020: BUN 10; Creatinine, Ser 0.76; Hemoglobin 13.0; Platelets 447; Potassium 4.0; Sodium 134   Recent Lipid Panel No results found for: CHOL, TRIG, HDL, CHOLHDL, LDLCALC, LDLDIRECT  Wt Readings from Last 3 Encounters:  11/11/20 268 lb (121.6 kg)  10/01/20 259 lb 14.8 oz (117.9 kg)  09/18/20 260 lb (117.9 kg)     Risk Assessment/Calculations:      Objective:    Vital Signs:  BP 130/89   Pulse 90   Ht 5' (1.524 m)   Wt 268 lb (121.6 kg)   BMI 52.34 kg/m    VITAL SIGNS:  reviewed GEN:  no acute distress CARDIOVASCULAR:  no peripheral edema  ASSESSMENT & PLAN:    Palpitations-holter normal-no further complaints  Chest pain felt to be related to GERD and asthma-protonix has helped symptoms  DOE likely related to asthma and GERD-normal echo and holter. F/u with cardiology prn. F/u with allergist and PCP  Morbid obesity-referred to weight loss management-has appte        COVID-19 Education: The signs and symptoms of COVID-19 were discussed with the patient and how to seek care for testing (follow up with PCP or arrange E-visit).   The importance of social distancing was discussed  today.  Time:   Today, I have spent 4:42 minutes with the patient with telehealth technology discussing the above problems.     Medication Adjustments/Labs and Tests Ordered: Current medicines are reviewed at length with the patient today.  Concerns regarding medicines are outlined above.   Tests Ordered: No orders of the defined types were placed in this encounter.   Medication Changes: No orders of the defined types were placed in this encounter.   Follow Up: Dr. Radford Pax prn    Signed, Ermalinda Barrios, PA-C  11/11/2020 1:38 PM    [facility]

## 2020-11-04 NOTE — Therapy (Signed)
[place], [place], [address] Phone: [phone]   Fax:  [phone]  Physical Therapy Treatment  Patient Details  Name: Kendra [name] MRN: [id number] Date of Birth: 1986-06-08 Referring Provider (PT): Gentry Fitz, MD   Encounter Date: 11/04/2020   PT End of Session - 11/04/20 1518    Visit Number 2    Number of Visits 7    Date for PT Re-Evaluation 12/16/20    Authorization Type Med Pay    PT Start Time 1884    PT Stop Time 1600    PT Time Calculation (min) 42 min    Activity Tolerance Patient tolerated treatment well    Behavior During Therapy [facility] for tasks assessed/performed           Past Medical History:  Diagnosis Date  . Asthma   . Convulsions/seizures ([place]) 07/27/2016   most recent 10/17/17  . H/O: C-section   . Headache   . Lipoma   . Lipoma of LUQ abdomen, s/p removal 06/27/2012 05/24/2012  . Meningioma ([place]) 07/27/2016   Anterior fossa    Past Surgical History:  Procedure Laterality Date  . APPLICATION OF CRANIAL NAVIGATION N/A 12/10/2016   Procedure: APPLICATION OF CRANIAL NAVIGATION;  Surgeon: Consuella Lose, MD;  Location: [facility];  Service: Neurosurgery;  Laterality: N/A;  . BRAIN SURGERY    . BREAST SURGERY  12/30/11   breast reduction  . CESAREAN SECTION  09/09/2010   [place]  . CESAREAN SECTION N/A 01/13/2015   Procedure: CESAREAN SECTION;  Surgeon: Janyth Pupa, DO;  Location: [facility];  Service: Obstetrics;  Laterality: N/A;  EDD 01/18/15 would like [place] dressing  . CHOLECYSTECTOMY N/A 11/23/2016   Procedure: LAPAROSCOPIC CHOLECYSTECTOMY WITH INTRAOPERATIVE CHOLANGIOGRAM;  Surgeon: Donnie Mesa, MD;  Location: [place] [facility];  Service: General;  Laterality: N/A;  . CRANIOTOMY N/A 12/10/2016   Procedure: BICORONAL CRANIOTOMY FOR RESECTION OF TUMOR;  Surgeon: Consuella Lose, MD;  Location: [place];  Service: Neurosurgery;  Laterality: N/A;  BICORONAL CRANIOTOMY FOR RESECTION OF  TUMOR   . LYMPH NODE DISSECTION  2013   Abdominal    There were no vitals filed for this visit.   Subjective Assessment - 11/04/20 1521    Subjective Pt states she's been squeezing the ball. Pt reports she's been doing the table slide exercises. Pt reports she is still unable to sleep on right shoulder. Pt states she notices that when she straightens her arm out in abduction she can still feel a little bit of pain.    Pertinent History PMH: COVID, seizures, meningioma, C-section, lipoma removal, obesity    Limitations Lifting    Diagnostic tests X-ray R shoulder: Small acromium fracture; CTH: (-) acute    Patient Stated Goals Improve pain    Currently in Pain? Yes    Pain Score 3               [facility] PT Assessment - 11/04/20 0001      Strength   Overall Strength Comments L grip 55 to 60#; R grip 55 to 60#            Therex seated:  Shoulder pulley flexion x1 min  Shoulder pulley abduction x1 min  Shoulder flexion to 90 deg 2x10 pain free  Shoulder abduction to 90 deg 2x10 pain free  Standing:  Shoulder ER with green tband 2x10  Shoulder IR with green tband 2x10  Row with green tband 2x10  Shoulder AAROM for abduction  with pball x10                 PT Short Term Goals - 10/21/20 1643      PT SHORT TERM GOAL #1   Title Pt will be independent with initial HEP    Time 4    Period Weeks    Status New    Target Date 11/18/20      PT SHORT TERM GOAL #2   Title Pt will have pain free full shoulder AROM    Time 4    Period Weeks    Status New    Target Date 11/18/20      PT SHORT TERM GOAL #3   Title Pt will be able to verbalize motions to decrease pain on R acromion    Time 4    Period Weeks    Status New    Target Date 11/18/20             PT Long Term Goals - 10/21/20 1643      PT LONG TERM GOAL #1   Title Pt will be independent with final shoulder strengthening HEP    Time 8    Period Weeks    Status New    Target Date 12/16/20       PT LONG TERM GOAL #2   Title Pt will have equal L and R hand grip strength    Baseline 55-60# on L, 15# on R    Time 8    Period Weeks    Status New    Target Date 12/16/20      PT LONG TERM GOAL #3   Title Pt will demo good mechanics for safe return to work    Time 8    Period Weeks    Status New    Target Date 12/16/20                 Plan - 11/04/20 1711    Clinical Impression Statement Treatment session focused on initiating shoulder strengthening regimen. Continued to work on pain free AAROM of shoulder abduction & flexion. Pt with mild pain during shoulder press ups but able to tolerate rotator cuff strengthening well. Pt with much improved grip strength this session -- R grip now equal to L grip.    Personal Factors and Comorbidities Age;Profession;Time since onset of injury/illness/exacerbation    Examination-Activity Limitations Sleep;Reach Overhead;Caring for Others;Lift    Examination-Participation Restrictions Occupation;Community Activity;Yard Work    Stability/Clinical Decision Making Stable/Uncomplicated    Rehab Potential Good    PT Frequency --   Every other week for 2 visits; switch to 1x/wk for 4 more weeks   PT Duration 8 weeks    PT Treatment/Interventions Iontophoresis 4mg /ml Dexamethasone;Cryotherapy;ADLs/Self Care Home Management;Moist Heat;Ultrasound;Functional mobility training;Therapeutic activities;Therapeutic exercise;Neuromuscular re-education;Manual techniques;Patient/family education;Passive range of motion;Taping    PT Next Visit Plan How was HEP? Continue AAROM of shoulder movements >90 deg and strengthen as tolerated. Continue scapular strengthening. Initiate bicep and tricep strengthening.    PT Home Exercise Plan Access Code [email or web address]    Consulted and Agree with Plan of Care Patient           Patient will benefit from skilled therapeutic intervention in order to improve the following deficits and impairments:  Pain,Impaired UE  functional use,Decreased strength,Decreased mobility  Visit Diagnosis: Acute pain of right shoulder  Muscle weakness (generalized)     Problem List Patient Active Problem List   Diagnosis Date Noted  . Convulsions/seizures (  [place]) 07/27/2016  . Meningioma ([place]) 07/27/2016  . Common migraine with intractable migraine 07/27/2016  . Status post repeat low transverse cesarean section 01/14/2015  . Lipoma of LUQ abdomen, s/p removal 06/27/2012 05/24/2012  . 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 Chlamydia infection 12/13/2011  . 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

## 2020-11-06 ENCOUNTER — Telehealth: Payer: Self-pay | Admitting: Podiatry

## 2020-11-06 NOTE — Telephone Encounter (Signed)
Patient want to inform Dr Jacqualyn Posey that she received the message regarding her MRI.

## 2020-11-11 ENCOUNTER — Telehealth: Payer: Self-pay

## 2020-11-11 ENCOUNTER — Other Ambulatory Visit: Payer: Self-pay

## 2020-11-11 ENCOUNTER — Encounter: Payer: Self-pay | Admitting: Physician Assistant

## 2020-11-11 ENCOUNTER — Telehealth (INDEPENDENT_AMBULATORY_CARE_PROVIDER_SITE_OTHER): Payer: Medicaid Other | Admitting: Physician Assistant

## 2020-11-11 VITALS — BP 130/89 | HR 90 | Ht 60.0 in | Wt 268.0 lb

## 2020-11-11 DIAGNOSIS — R002 Palpitations: Secondary | ICD-10-CM | POA: Diagnosis not present

## 2020-11-11 DIAGNOSIS — R0602 Shortness of breath: Secondary | ICD-10-CM

## 2020-11-11 DIAGNOSIS — R079 Chest pain, unspecified: Secondary | ICD-10-CM | POA: Diagnosis not present

## 2020-11-11 DIAGNOSIS — E669 Obesity, unspecified: Secondary | ICD-10-CM | POA: Diagnosis not present

## 2020-11-11 NOTE — Telephone Encounter (Signed)
  Patient Consent for Virtual Visit         Kendra [name] has provided verbal consent on 11/11/2020 for a virtual visit (video or telephone).   CONSENT FOR VIRTUAL VISIT FOR:  Kendra [name]  By participating in this virtual visit I agree to the following:  I hereby voluntarily request, consent and authorize [facility] and its employed or contracted physicians, physician assistants, nurse practitioners or other licensed health care professionals (the Practitioner), to provide me with telemedicine health care services (the "Services") as deemed necessary by the treating Practitioner. I acknowledge and consent to receive the Services by the Practitioner via telemedicine. I understand that the telemedicine visit will involve communicating with the Practitioner through live audiovisual communication technology and the disclosure of certain medical information by electronic transmission. I acknowledge that I have been given the opportunity to request an in-person assessment or other available alternative prior to the telemedicine visit and am voluntarily participating in the telemedicine visit.  I understand that I have the right to withhold or withdraw my consent to the use of telemedicine in the course of my care at any time, without affecting my right to future care or treatment, and that the Practitioner or I may terminate the telemedicine visit at any time. I understand that I have the right to inspect all information obtained and/or recorded in the course of the telemedicine visit and may receive copies of available information for a reasonable fee.  I understand that some of the potential risks of receiving the Services via telemedicine include:  Marland Kitchen Delay or interruption in medical evaluation due to technological equipment failure or disruption; . Information transmitted may not be sufficient (e.g. poor resolution of images) to allow for appropriate medical decision making by the Practitioner;  and/or  . In rare instances, security protocols could fail, causing a breach of personal health information.  Furthermore, I acknowledge that it is my responsibility to provide information about my medical history, conditions and care that is complete and accurate to the best of my ability. I acknowledge that Practitioner's advice, recommendations, and/or decision may be based on factors not within their control, such as incomplete or inaccurate data provided by me or distortions of diagnostic images or specimens that may result from electronic transmissions. I understand that the practice of medicine is not an exact science and that Practitioner makes no warranties or guarantees regarding treatment outcomes. I acknowledge that a copy of this consent can be made available to me via my patient portal ([facility]), or I can request a printed copy by calling the office of [facility].    I understand that my insurance will be billed for this visit.   I have read or had this consent read to me. . I understand the contents of this consent, which adequately explains the benefits and risks of the Services being provided via telemedicine.  . I have been provided ample opportunity to ask questions regarding this consent and the Services and have had my questions answered to my satisfaction. . I give my informed consent for the services to be provided through the use of telemedicine in my medical care

## 2020-11-11 NOTE — Patient Instructions (Signed)
Medication Instructions:  Your physician recommends that you continue on your current medications as directed. Please refer to the Current Medication list given to you today.  *If you need a refill on your cardiac medications before your next appointment, please call your pharmacy*   Lab Work: None ordered   If you have labs (blood work) drawn today and your tests are completely normal, you will receive your results only by: Marland Kitchen MyChart Message (if you have MyChart) OR . A paper copy in the mail If you have any lab test that is abnormal or we need to change your treatment, we will call you to review the results.   Testing/Procedures: None ordered    Follow-Up: You can follow up with our office as needed   Other Instructions None

## 2020-11-13 ENCOUNTER — Other Ambulatory Visit: Payer: Self-pay

## 2020-11-13 ENCOUNTER — Ambulatory Visit: Payer: Medicaid Other | Admitting: Podiatry

## 2020-11-13 DIAGNOSIS — M79672 Pain in left foot: Secondary | ICD-10-CM

## 2020-11-13 DIAGNOSIS — R609 Edema, unspecified: Secondary | ICD-10-CM | POA: Diagnosis not present

## 2020-11-16 NOTE — Progress Notes (Signed)
Subjective: 35 year old female presents the office today for follow evaluation of left foot discomfort, swelling.  She has seen numerous other physicians for this without any improvement.  She is asking about "fluid pills" today.  She has not seen her primary care physician for this recently she reports. Denies any systemic complaints such as fevers, chills, nausea, vomiting. No acute changes since last appointment, and no other complaints at this time.   Objective: AAO x3, NAD DP/PT pulses palpable bilaterally, CRT less than 3 seconds There is still chronic edema present most of the left foot and ankle. There is no erythema or warmth there is no fluctuation.  There is no open lesions.  No significant tenderness palpation.  Flexor, extensor tendons appear to be intact.  MMT 5/5.  No pain with calf compression, swelling, warmth, erythema  Assessment: Chronic lower extremity edema left side  Plan: -All treatment options discussed with the patient including all alternatives, risks, complications.  -She has had numerous other physicians for the swelling.  Not completely sure what caused no swelling however I do think it is likely lymphedema.  She is interested in possibly fluid pills.  Discussed that she is to contact her primary care physician for this.  I also recommend follow with her primary care physician as well.  I do not think the swelling is just a foot issue and there could be some other underlying process. -Patient encouraged to call the office with any questions, concerns, change in symptoms.   Trula Slade DPM

## 2020-11-18 ENCOUNTER — Ambulatory Visit (HOSPITAL_BASED_OUTPATIENT_CLINIC_OR_DEPARTMENT_OTHER): Payer: Medicaid Other | Admitting: Physical Therapy

## 2020-11-18 ENCOUNTER — Other Ambulatory Visit: Payer: Self-pay

## 2020-11-18 DIAGNOSIS — M6281 Muscle weakness (generalized): Secondary | ICD-10-CM

## 2020-11-18 DIAGNOSIS — M25511 Pain in right shoulder: Secondary | ICD-10-CM

## 2020-11-18 NOTE — Therapy (Signed)
[place], [place], [address] Phone: [phone]   Fax:  [phone]  Physical Therapy Treatment and Discharge  Patient Details  Name: Kendra [name] MRN: [id number] Date of Birth: [birth year] Referring Provider (PT): Gentry Fitz, MD   PHYSICAL THERAPY DISCHARGE SUMMARY  Visits from Start of Care: 3  Current functional level related to goals / functional outcomes: See below   Remaining deficits: Mild R shoulder pain with weight >4lbs during shoulder abduction   Education / Equipment: Discussed progressing shoulder strengthening  Plan: Patient agrees to discharge.  Patient goals were met. Patient is being discharged due to meeting the stated rehab goals.  ?????         Encounter Date: 11/18/2020   PT End of Session - 11/18/20 1525    Visit Number 3    Number of Visits 7    Date for PT Re-Evaluation 12/16/20    Authorization Type Med Pay    PT Start Time 9030    PT Stop Time 1600    PT Time Calculation (min) 42 min    Activity Tolerance Patient tolerated treatment well    Behavior During Therapy [facility] for tasks assessed/performed           Past Medical History:  Diagnosis Date  . Asthma   . Convulsions/seizures ([place]) 07/27/2016   most recent 10/17/17  . H/O: C-section   . Headache   . Lipoma   . Lipoma of LUQ abdomen, s/p removal 06/27/2012 05/24/2012  . Meningioma ([place]) 07/27/2016   Anterior fossa    Past Surgical History:  Procedure Laterality Date  . APPLICATION OF CRANIAL NAVIGATION N/A 12/10/2016   Procedure: APPLICATION OF CRANIAL NAVIGATION;  Surgeon: Consuella Lose, MD;  Location: [place];  Service: Neurosurgery;  Laterality: N/A;  . BRAIN SURGERY    . BREAST SURGERY  12/30/11   breast reduction  . CESAREAN SECTION  09/09/2010   [place]  . CESAREAN SECTION N/A 01/13/2015   Procedure: CESAREAN SECTION;  Surgeon: Janyth Pupa, DO;  Location: [facility];  Service: Obstetrics;   Laterality: N/A;  EDD 01/18/15 would like [place] dressing  . CHOLECYSTECTOMY N/A 11/23/2016   Procedure: LAPAROSCOPIC CHOLECYSTECTOMY WITH INTRAOPERATIVE CHOLANGIOGRAM;  Surgeon: Donnie Mesa, MD;  Location: [place];  Service: General;  Laterality: N/A;  . CRANIOTOMY N/A 12/10/2016   Procedure: BICORONAL CRANIOTOMY FOR RESECTION OF TUMOR;  Surgeon: Consuella Lose, MD;  Location: [place];  Service: Neurosurgery;  Laterality: N/A;  BICORONAL CRANIOTOMY FOR RESECTION OF TUMOR   . LYMPH NODE DISSECTION  2013   Abdominal    There were no vitals filed for this visit.   Subjective Assessment - 11/18/20 1519    Subjective Pt states her shoulder is doing well. She has been going to the gym. Pt reports she is ready to go back to work.    Pertinent History PMH: COVID, seizures, meningioma, C-section, lipoma removal, obesity    Limitations Lifting    Diagnostic tests X-ray R shoulder: Small acromium fracture; CTH: (-) acute    Patient Stated Goals Improve pain    Currently in Pain? No/denies                [place] 1 min shoulder flexion, 1 min abduction   Therex:  Lat pull down 3x10 40#  Row 3x10 30#  Dumbbell shoulder press 3x10 6#  Dumbbell shoulder abduction 3x10 2#  Dumbbell shoulder flexion 3x10 2#  PT Short Term Goals - 11/18/20 1548      PT SHORT TERM GOAL #1   Title Pt will be independent with initial HEP    Time 4    Period Weeks    Status Achieved    Target Date 11/18/20      PT SHORT TERM GOAL #2   Title Pt will have pain free full shoulder AROM    Time 4    Period Weeks    Status Achieved    Target Date 11/18/20      PT SHORT TERM GOAL #3   Title Pt will be able to verbalize motions to decrease pain on R acromion    Time 4    Period Weeks    Status Achieved    Target Date 11/18/20             PT Long Term Goals - 11/18/20 1548      PT LONG TERM GOAL #1   Title Pt will be independent with final shoulder strengthening HEP     Time 8    Period Weeks    Status Achieved      PT LONG TERM GOAL #2   Title Pt will have equal L and R hand grip strength    Baseline 55-60# on L, 15# on R    Time 8    Period Weeks    Status Achieved      PT LONG TERM GOAL #3   Title Pt will demo good mechanics for safe return to work    Time 8    Period Weeks    Status Achieved                 Plan - 11/18/20 1540    Clinical Impression Statement Kendra [name] is doing well this session. Pt with full pain free shoulder ROM with added weights. Pt with some R shoulder pain with 4#s; advised pt to work on strengthening this at home with less weight until 4# is pain free. Pt has met all PT goals and is ready for D/C.    Personal Factors and Comorbidities Age;Profession;Time since onset of injury/illness/exacerbation    Examination-Activity Limitations Sleep;Reach Overhead;Caring for Others;Lift    Examination-Participation Restrictions Occupation;Community Activity;Yard Work    Stability/Clinical Decision Making Stable/Uncomplicated    Rehab Potential Good    PT Frequency --   Every other week for 2 visits; switch to 1x/wk for 4 more weeks   PT Duration 8 weeks    PT Treatment/Interventions Iontophoresis 4mg /ml Dexamethasone;Cryotherapy;ADLs/Self Care Home Management;Moist Heat;Ultrasound;Functional mobility training;Therapeutic activities;Therapeutic exercise;Neuromuscular re-education;Manual techniques;Patient/family education;Passive range of motion;Taping    PT Next Visit Plan How was HEP? Continue AAROM of shoulder movements >90 deg and strengthen as tolerated. Continue scapular strengthening. Initiate bicep and tricep strengthening.    PT Home Exercise Plan Access Code [email or web address]    Consulted and Agree with Plan of Care Patient           Patient will benefit from skilled therapeutic intervention in order to improve the following deficits and impairments:  Pain,Impaired UE functional use,Decreased strength,Decreased  mobility  Visit Diagnosis: Acute pain of right shoulder  Muscle weakness (generalized)     Problem List Patient Active Problem List   Diagnosis Date Noted  . Convulsions/seizures ([place]) 07/27/2016  . Meningioma ([place]) 07/27/2016  . Common migraine with intractable migraine 07/27/2016  . Status post repeat low transverse cesarean section 01/14/2015  . Lipoma of LUQ abdomen, s/p removal 06/27/2012 05/24/2012  .  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 Chlamydia infection 12/13/2011  . 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

## 2020-11-19 ENCOUNTER — Other Ambulatory Visit (INDEPENDENT_AMBULATORY_CARE_PROVIDER_SITE_OTHER): Payer: Self-pay

## 2020-11-19 DIAGNOSIS — Z0289 Encounter for other administrative examinations: Secondary | ICD-10-CM

## 2020-11-19 DIAGNOSIS — R569 Unspecified convulsions: Secondary | ICD-10-CM

## 2020-11-19 DIAGNOSIS — Z5181 Encounter for therapeutic drug level monitoring: Secondary | ICD-10-CM

## 2020-11-20 LAB — CBC WITH DIFFERENTIAL/PLATELET
Basophils Absolute: 0 10*3/uL (ref 0.0–0.2)
Basos: 0 %
EOS (ABSOLUTE): 0.1 10*3/uL (ref 0.0–0.4)
Eos: 2 %
Hematocrit: 41.1 % (ref 34.0–46.6)
Hemoglobin: 13.7 g/dL (ref 11.1–15.9)
Immature Grans (Abs): 0 10*3/uL (ref 0.0–0.1)
Immature Granulocytes: 0 %
Lymphocytes Absolute: 3.4 10*3/uL — ABNORMAL HIGH (ref 0.7–3.1)
Lymphs: 42 %
MCH: 30.6 pg (ref 26.6–33.0)
MCHC: 33.3 g/dL (ref 31.5–35.7)
MCV: 92 fL (ref 79–97)
Monocytes Absolute: 0.4 10*3/uL (ref 0.1–0.9)
Monocytes: 5 %
Neutrophils Absolute: 4.1 10*3/uL (ref 1.4–7.0)
Neutrophils: 51 %
Platelets: 476 10*3/uL — ABNORMAL HIGH (ref 150–450)
RBC: 4.48 x10E6/uL (ref 3.77–5.28)
RDW: 13.5 % (ref 11.7–15.4)
WBC: 8.1 10*3/uL (ref 3.4–10.8)

## 2020-11-20 LAB — COMPREHENSIVE METABOLIC PANEL
ALT: 14 IU/L (ref 0–32)
AST: 12 IU/L (ref 0–40)
Albumin/Globulin Ratio: 1.6 (ref 1.2–2.2)
Albumin: 4.5 g/dL (ref 3.8–4.8)
Alkaline Phosphatase: 101 IU/L (ref 44–121)
BUN/Creatinine Ratio: 12 (ref 9–23)
BUN: 10 mg/dL (ref 6–20)
Bilirubin Total: 0.3 mg/dL (ref 0.0–1.2)
CO2: 22 mmol/L (ref 20–29)
Calcium: 9.6 mg/dL (ref 8.7–10.2)
Chloride: 100 mmol/L (ref 96–106)
Creatinine, Ser: 0.81 mg/dL (ref 0.57–1.00)
Globulin, Total: 2.8 g/dL (ref 1.5–4.5)
Glucose: 94 mg/dL (ref 65–99)
Potassium: 4.5 mmol/L (ref 3.5–5.2)
Sodium: 138 mmol/L (ref 134–144)
Total Protein: 7.3 g/dL (ref 6.0–8.5)
eGFR: 99 mL/min/{1.73_m2} (ref >59)

## 2020-11-20 LAB — LAMOTRIGINE LEVEL: Lamotrigine Lvl: 6.5 ug/mL (ref 2.0–20.0)

## 2020-11-25 ENCOUNTER — Telehealth: Payer: Self-pay | Admitting: *Deleted

## 2020-11-25 NOTE — Telephone Encounter (Signed)
-----   Message from Ward Givens, NP sent at 11/24/2020  8:39 AM EDT ----- Blood work is unremarkable.  Need to get a EEG scheduled

## 2020-11-26 ENCOUNTER — Ambulatory Visit: Payer: Medicaid Other | Admitting: Neurology

## 2020-11-26 DIAGNOSIS — R569 Unspecified convulsions: Secondary | ICD-10-CM | POA: Diagnosis not present

## 2020-11-26 DIAGNOSIS — Z5181 Encounter for therapeutic drug level monitoring: Secondary | ICD-10-CM

## 2020-11-27 ENCOUNTER — Ambulatory Visit (INDEPENDENT_AMBULATORY_CARE_PROVIDER_SITE_OTHER): Payer: Medicaid Other | Admitting: Family Medicine

## 2020-12-02 ENCOUNTER — Encounter (HOSPITAL_BASED_OUTPATIENT_CLINIC_OR_DEPARTMENT_OTHER): Payer: Medicaid Other | Admitting: Physical Therapy

## 2020-12-09 ENCOUNTER — Other Ambulatory Visit: Payer: Self-pay

## 2020-12-09 ENCOUNTER — Ambulatory Visit (INDEPENDENT_AMBULATORY_CARE_PROVIDER_SITE_OTHER): Payer: Medicaid Other | Admitting: Family Medicine

## 2020-12-09 ENCOUNTER — Encounter (INDEPENDENT_AMBULATORY_CARE_PROVIDER_SITE_OTHER): Payer: Self-pay | Admitting: Family Medicine

## 2020-12-09 DIAGNOSIS — Z0289 Encounter for other administrative examinations: Secondary | ICD-10-CM

## 2020-12-11 ENCOUNTER — Encounter: Payer: Self-pay | Admitting: Adult Health

## 2020-12-11 ENCOUNTER — Ambulatory Visit: Payer: Medicaid Other | Admitting: Adult Health

## 2020-12-11 ENCOUNTER — Other Ambulatory Visit: Payer: Self-pay

## 2020-12-11 ENCOUNTER — Ambulatory Visit (INDEPENDENT_AMBULATORY_CARE_PROVIDER_SITE_OTHER): Payer: Medicaid Other | Admitting: Family Medicine

## 2020-12-11 VITALS — BP 128/76 | HR 85 | Ht 61.0 in | Wt 260.0 lb

## 2020-12-11 DIAGNOSIS — R569 Unspecified convulsions: Secondary | ICD-10-CM

## 2020-12-11 DIAGNOSIS — G43709 Chronic migraine without aura, not intractable, without status migrainosus: Secondary | ICD-10-CM

## 2020-12-11 NOTE — Progress Notes (Signed)
PATIENT: Kendra [name] DOB: July 30, 1986  REASON FOR VISIT: follow up HISTORY FROM: patient  HISTORY OF PRESENT ILLNESS: Today 12/11/20:  Ms. Kendra [name] is a 35 year old female with a history of seizures.  She returns today for follow-up.  She states that she has not had any additional seizures.  She continues on Lamictal 100 mg twice a day.  Continues on gabapentin 100 mg at bedtime for headaches.  Reports that she has not had any severe headaches.  She returns today for an evaluation.  HISTORY 11/03/20:   Ms. Kendra [name] is a 35 year old female with a history of seizures.  She returns today for follow-up.  She states on February 1 she was taken to the hospital for seizure event.  She states that she was at a client's house and had a seizure event and lost consciousness.  When she fell she did hit her right shoulder and head.  Reports that at the ED they did imaging.  CT of the head was unremarkable.  She does have a fracture in the right shoulder.  She states that since the event she has been having pain in the left side of the head.  She states that it feels muscular but she did not hit her head on the side.  The patient reports that she did not miss any medication.  However I read the ED note and it appears the patient had Covid 2 weeks prior to this event.  She reported to the ED that she had missed some doses of her medication while she had Covid.  Today the patient does not recall that.  She joins me today for a virtual visit.   REVIEW OF SYSTEMS: Out of a complete 14 system review of symptoms, the patient complains only of the following symptoms, and all other reviewed systems are negative.  See HPI  ALLERGIES: Allergies  Allergen Reactions  . Amoxicillin   . Asa Buff (Mag [Buffered Aspirin] Anaphylaxis  . Erythromycin Anaphylaxis  . Iodine Anaphylaxis, Hives and Other (See Comments)  . Peanut-Containing Drug Products Anaphylaxis and Hives    Pt is allergic to peanut oil  . Shellfish  Allergy Anaphylaxis, Hives, Swelling and Other (See Comments)    Pt throat swells  . Other Other (See Comments)  . Ibuprofen Rash    HOME MEDICATIONS: Outpatient Medications Prior to Visit  Medication Sig Dispense Refill  . acetaminophen (TYLENOL) 500 MG tablet Take 1,000 mg by mouth every 6 (six) hours as needed (for pain/headaches.).    Marland Kitchen albuterol (VENTOLIN HFA) 108 (90 Base) MCG/ACT inhaler Inhale 2 puffs into the lungs every 6 (six) hours as needed for wheezing or shortness of breath. 1 each 0  . budesonide-formoterol (SYMBICORT) 160-4.5 MCG/ACT inhaler Inhale 2 puffs into the lungs 2 (two) times daily. 1 each 0  . diphenhydrAMINE (BENADRYL) 25 mg capsule Take 1 capsule (25 mg total) by mouth every 6 (six) hours as needed. (Patient taking differently: Take 25 mg by mouth every 6 (six) hours as needed for allergies.) 30 capsule 0  . EPINEPHrine 0.3 mg/0.3 mL IJ SOAJ injection Inject 0.3 mg into the skin once.    . fluticasone (FLONASE) 50 MCG/ACT nasal spray Place 1 spray into both nostrils daily. 16 g 0  . gabapentin (NEURONTIN) 100 MG capsule Take 1 capsule (100 mg total) by mouth at bedtime. 90 capsule 3  . lamoTRIgine (LAMICTAL) 100 MG tablet TAKE 2 TABLETS(200 MG) BY MOUTH TWICE DAILY 120 tablet 5  . levonorgestrel (MIRENA)  20 MCG/24HR IUD 1 each by Intrauterine route once.    Marland Kitchen LINZESS 290 MCG CAPS capsule Take 290 mcg by mouth daily as needed (constipation).   3  . pantoprazole (PROTONIX) 40 MG tablet Take 1 tablet (40 mg) twice daily for two weeks. Then take 1 tablet (40 mg) daily. 90 tablet 3  . cetirizine (ZYRTEC ALLERGY) 10 MG tablet Take 1 tablet (10 mg total) by mouth daily. 30 tablet 2   No facility-administered medications prior to visit.    PAST MEDICAL HISTORY: Past Medical History:  Diagnosis Date  . Asthma   . Convulsions/seizures ([place]) 07/27/2016   most recent 10/17/17  . H/O: C-section   . Headache   . Lipoma   . Lipoma of LUQ abdomen, s/p removal 06/27/2012  05/24/2012  . Meningioma ([place]) 07/27/2016   Anterior fossa    PAST SURGICAL HISTORY: Past Surgical History:  Procedure Laterality Date  . APPLICATION OF CRANIAL NAVIGATION N/A 12/10/2016   Procedure: APPLICATION OF CRANIAL NAVIGATION;  Surgeon: Consuella Lose, MD;  Location: [place];  Service: Neurosurgery;  Laterality: N/A;  . BRAIN SURGERY    . BREAST SURGERY  12/30/11   breast reduction  . CESAREAN SECTION  09/09/2010   [place]  . CESAREAN SECTION N/A 01/13/2015   Procedure: CESAREAN SECTION;  Surgeon: Janyth Pupa, DO;  Location: [facility];  Service: Obstetrics;  Laterality: N/A;  EDD 01/18/15 would like [place] dressing  . CHOLECYSTECTOMY N/A 11/23/2016   Procedure: LAPAROSCOPIC CHOLECYSTECTOMY WITH INTRAOPERATIVE CHOLANGIOGRAM;  Surgeon: Donnie Mesa, MD;  Location: [place];  Service: General;  Laterality: N/A;  . CRANIOTOMY N/A 12/10/2016   Procedure: BICORONAL CRANIOTOMY FOR RESECTION OF TUMOR;  Surgeon: Consuella Lose, MD;  Location: [place];  Service: Neurosurgery;  Laterality: N/A;  BICORONAL CRANIOTOMY FOR RESECTION OF TUMOR   . LYMPH NODE DISSECTION  2013   Abdominal    FAMILY HISTORY: Family History  Problem Relation Age of Onset  . Kidney disease Mother        kidney stones  . Cancer Maternal Grandmother        breast  . Cancer Maternal Grandfather        Colon  . Deep vein thrombosis Maternal Grandfather        in throat  . Healthy Father   . Cancer Maternal Aunt        breast  . Heart disease Maternal Uncle   . Stroke Maternal Uncle   . Liver cancer Maternal Uncle   . Diabetes Paternal Grandfather     SOCIAL HISTORY: Social History   Socioeconomic History  . Marital status: Single    Spouse name: Not on file  . Number of children: 2  . Years of education: 43  . Highest education level: Not on file  Occupational History  . Occupation: [employer]  Tobacco Use  . Smoking status: Never Smoker  . Smokeless tobacco: Never Used  Vaping Use  . Vaping Use: Never used   Substance and Sexual Activity  . Alcohol use: No  . Drug use: No  . Sexual activity: Yes    Partners: Male    Birth control/protection: I.U.D.  Other Topics Concern  . Not on file  Social History Narrative   Lives at home w/ her 2 children   Right-handed   Caffeine: couple of cups per month   Social Determinants of Health   Financial Resource Strain: Not on file  Food Insecurity: Not on file  Transportation Needs: Not on file  Physical Activity: Not on file  Stress: Not on file  Social Connections: Not on file  Intimate Partner Violence: Not on file      PHYSICAL EXAM  Vitals:   12/11/20 1050  Weight: 260 lb (117.9 kg)  Height: 5\' 1"  (1.549 m)   Body mass index is 49.13 kg/m.  Generalized: Well developed, in no acute distress   Neurological examination  Mentation: Alert oriented to time, place, history taking. Follows all commands speech and language fluent Cranial nerve II-XII: Pupils were equal round reactive to light. Extraocular movements were full, visual field were full on confrontational test. Facial sensation and strength were normal. Uvula tongue midline. Head turning and shoulder shrug  were normal and symmetric. Motor: The motor testing reveals 5 over 5 strength of all 4 extremities. Good symmetric motor tone is noted throughout.  Sensory: Sensory testing is intact to soft touch on all 4 extremities. No evidence of extinction is noted.  Coordination: Cerebellar testing reveals good finger-nose-finger and heel-to-shin bilaterally.  Gait and station: Gait is normal. Tandem gait is normal. Romberg is negative. No drift is seen.  Reflexes: Deep tendon reflexes are symmetric and normal bilaterally.   DIAGNOSTIC DATA (LABS, IMAGING, TESTING) - I reviewed patient records, labs, notes, testing and imaging myself where available.  Lab Results  Component Value Date   WBC 8.1 11/19/2020   HGB 13.7 11/19/2020   HCT 41.1 11/19/2020   MCV 92 11/19/2020   PLT 476  (H) 11/19/2020      Component Value Date/Time   NA 138 11/19/2020 1049   K 4.5 11/19/2020 1049   CL 100 11/19/2020 1049   CO2 22 11/19/2020 1049   GLUCOSE 94 11/19/2020 1049   GLUCOSE 102 (H) 09/30/2020 1816   BUN 10 11/19/2020 1049   CREATININE 0.81 11/19/2020 1049   CALCIUM 9.6 11/19/2020 1049   PROT 7.3 11/19/2020 1049   ALBUMIN 4.5 11/19/2020 1049   AST 12 11/19/2020 1049   ALT 14 11/19/2020 1049   ALKPHOS 101 11/19/2020 1049   BILITOT 0.3 11/19/2020 1049   GFRNONAA >60 09/30/2020 1816   GFRAA 115 09/19/2019 1407      ASSESSMENT AND PLAN 35 y.o. year old female  has a past medical history of Asthma, Convulsions/seizures ([place]) (07/27/2016), H/O: C-section, Headache, Lipoma, Lipoma of LUQ abdomen, s/p removal 06/27/2012 (05/24/2012), and Meningioma ([place]) (07/27/2016). here with:  1.  Seizures 2.  Migraine headaches  --Continue Lamictal 100 mg twice a day -- Educated the patient on the importance of taking her medication consistently.  It appears that her breakthrough seizure may have been caused by missed medication and COVID infection? -- Patient had a repeat EEG however this has not been interpreted yet advised I will call the patient once I have those results -- Continue gabapentin 100 mg at bedtime for migraines -- Follow-up in 6 months or sooner if needed   Ward Givens, MSN, NP-C 12/11/2020, 10:54 AM [facility] [address] [place], [place] [address] [phone]

## 2020-12-11 NOTE — Progress Notes (Signed)
error 

## 2020-12-11 NOTE — Patient Instructions (Signed)
Your Plan:  Continue lamictal 100 mg twice a day Continue gabapentin 100 mg at bedtime If your symptoms worsen or you develop new symptoms please let us know.    Thank you for coming to see [place] at [facility]. I hope we have been able to provide you high quality care today.  You may receive a patient satisfaction survey over the next few weeks. We would appreciate your feedback and comments so that we may continue to improve ourselves and the health of our patients.

## 2020-12-11 NOTE — Procedures (Signed)
   HISTORY: 35 years old female, with history of seizure, chronic migraine headache  TECHNIQUE:  This is a routine 16 channel EEG recording with one channel devoted to a limited EKG recording.  It was performed during wakefulness, drowsiness and asleep.  Hyperventilation and photic stimulation were performed as activating procedures.  There are minimum muscle and movement artifact noted.  Upon maximum arousal, posterior dominant waking rhythm consistent of rhythmic alpha range activity, with frequency of 10 hz. Activities are symmetric over the bilateral posterior derivations and attenuated with eye opening.  Hyperventilation produced mild/moderate buildup with higher amplitude and the slower activities noted.  Photic stimulation did not alter the tracing.  During EEG recording, patient developed drowsiness and entered sleep, sleep EEG demonstrated architecture, there were frontal centrally dominant vertex waves and symmetric sleep spindles noted.  During EEG recording, there was no epileptiform discharge noted.  EKG demonstrate sinus rhythm, with heart rate of  CONCLUSION: This is a normal awake and sleep EEG.  There is no electrodiagnostic evidence of epileptiform discharge.  Marcial Pacas, M.D. Ph.D.  [facility] [place], [place] [address] Phone: [phone] Fax:      [phone]

## 2020-12-11 NOTE — Progress Notes (Signed)
I have read the note, and I agree with the clinical assessment and plan.  Neha Waight K Sahmir Weatherbee   

## 2020-12-16 ENCOUNTER — Encounter (HOSPITAL_BASED_OUTPATIENT_CLINIC_OR_DEPARTMENT_OTHER): Payer: Medicaid Other | Admitting: Physical Therapy

## 2020-12-23 ENCOUNTER — Ambulatory Visit (INDEPENDENT_AMBULATORY_CARE_PROVIDER_SITE_OTHER): Payer: Medicaid Other | Admitting: Family Medicine

## 2020-12-23 ENCOUNTER — Encounter (HOSPITAL_BASED_OUTPATIENT_CLINIC_OR_DEPARTMENT_OTHER): Payer: Medicaid Other | Admitting: Physical Therapy

## 2021-01-13 ENCOUNTER — Encounter (INDEPENDENT_AMBULATORY_CARE_PROVIDER_SITE_OTHER): Payer: Self-pay | Admitting: Family Medicine

## 2021-01-13 ENCOUNTER — Ambulatory Visit (INDEPENDENT_AMBULATORY_CARE_PROVIDER_SITE_OTHER): Payer: Medicaid Other | Admitting: Family Medicine

## 2021-01-13 ENCOUNTER — Other Ambulatory Visit: Payer: Self-pay

## 2021-01-13 VITALS — BP 107/63 | HR 81 | Temp 98.6°F | Ht 60.0 in | Wt 258.0 lb

## 2021-01-13 DIAGNOSIS — R5383 Other fatigue: Secondary | ICD-10-CM | POA: Diagnosis not present

## 2021-01-13 DIAGNOSIS — R0602 Shortness of breath: Secondary | ICD-10-CM

## 2021-01-13 DIAGNOSIS — G40A19 Absence epileptic syndrome, intractable, without status epilepticus: Secondary | ICD-10-CM

## 2021-01-13 DIAGNOSIS — F32A Depression, unspecified: Secondary | ICD-10-CM

## 2021-01-13 DIAGNOSIS — Z1331 Encounter for screening for depression: Secondary | ICD-10-CM | POA: Diagnosis not present

## 2021-01-13 DIAGNOSIS — R739 Hyperglycemia, unspecified: Secondary | ICD-10-CM

## 2021-01-13 DIAGNOSIS — E559 Vitamin D deficiency, unspecified: Secondary | ICD-10-CM

## 2021-01-13 DIAGNOSIS — J3089 Other allergic rhinitis: Secondary | ICD-10-CM

## 2021-01-13 DIAGNOSIS — F419 Anxiety disorder, unspecified: Secondary | ICD-10-CM

## 2021-01-13 DIAGNOSIS — Z6841 Body Mass Index (BMI) 40.0 and over, adult: Secondary | ICD-10-CM

## 2021-01-14 LAB — INSULIN, RANDOM: INSULIN: 6.8 u[IU]/mL (ref 2.6–24.9)

## 2021-01-14 LAB — VITAMIN D 25 HYDROXY (VIT D DEFICIENCY, FRACTURES): Vit D, 25-Hydroxy: 16.3 ng/mL — ABNORMAL LOW (ref 30.0–100.0)

## 2021-01-14 LAB — T4: T4, Total: 7.7 ug/dL (ref 4.5–12.0)

## 2021-01-14 LAB — T3: T3, Total: 141 ng/dL (ref 71–180)

## 2021-01-14 LAB — LIPID PANEL WITH LDL/HDL RATIO
Cholesterol, Total: 262 mg/dL — ABNORMAL HIGH (ref 100–199)
HDL: 52 mg/dL (ref >39)
LDL Chol Calc (NIH): 173 mg/dL — ABNORMAL HIGH (ref 0–99)
LDL/HDL Ratio: 3.3 ratio — ABNORMAL HIGH (ref 0.0–3.2)
Triglycerides: 198 mg/dL — ABNORMAL HIGH (ref 0–149)
VLDL Cholesterol Cal: 37 mg/dL (ref 5–40)

## 2021-01-14 LAB — TSH: TSH: 2.64 u[IU]/mL (ref 0.450–4.500)

## 2021-01-14 LAB — HEMOGLOBIN A1C
Est. average glucose Bld gHb Est-mCnc: 114 mg/dL
Hgb A1c MFr Bld: 5.6 % (ref 4.8–5.6)

## 2021-01-14 NOTE — Progress Notes (Signed)
Chief Complaint:   OBESITY Kendra [name] (MR# [id number]) is a 35 y.o. female who presents for evaluation and treatment of obesity and related comorbidities. Current BMI is Body mass index is 50.39 kg/m. Kendra [name] has been struggling with her weight for many years and has been unsuccessful in either losing weight, maintaining weight loss, or reaching her healthy weight goal.  Kendra [name] is currently in the action stage of change and ready to dedicate time achieving and maintaining a healthier weight. Kendra [name] is interested in becoming our patient and working on intensive lifestyle modifications including (but not limited to) diet and exercise for weight loss.  [place] heard about the clinic from family. She does anticipate some sabotage from her mother. Pt eats out almost daily- cookout and [employer]. She skips breakfast 5 out of 7 days a week. If she eats breakfast- 2 cups of Cinnamon Toast Crunch cereal and drinks milk (Fairlife 2%) (feel full). ~5 PM fast food- [employer]- #1 sandwich with no pickles + fries + drink (Sprite) (eat and drink all). ~9 PM/10 PM 3 oz fish + 1/2 cup rice + 1/2 cup broccoli (felt full).  Kendra [name]'s habits were reviewed today and are as follows: Her family eats meals together, she thinks her family will eat healthier with her, she struggles with family and or coworkers weight loss sabotage, her desired weight loss is 98 lbs, she has been heavy most of her life, she started gaining weight after her pregnancy, she has significant food cravings issues, she snacks frequently in the evenings, she skips meals frequently, she is frequently drinking liquids with calories, she frequently makes poor food choices, she frequently eats larger portions than normal, she has binge eating behaviors and she struggles with emotional eating.  Depression Screen Kendra [name]'s Food and Mood (modified PHQ-9) score was 13.  Depression screen PHQ 2/9 01/13/2021  Decreased Interest 3  Down, Depressed, Hopeless 2   PHQ - 2 Score 5  Altered sleeping 1  Tired, decreased energy 1  Change in appetite 1  Feeling bad or failure about yourself  1  Trouble concentrating 1  Moving slowly or fidgety/restless 2  Suicidal thoughts 1  PHQ-9 Score 13  Difficult doing work/chores Very difficult   Subjective:   1. Other fatigue Kendra [name] admits to daytime somnolence and admits to waking up still tired. [place] has a history of symptoms of daytime fatigue and morning fatigue. [name] generally gets 5 hours of sleep per night, and states that she has poor sleep quality. Snoring is not present. Apneic episodes are not present. Epworth Sleepiness Score is 6. EKG- NSR with poor R-wave progression; 84 bpm.  2. Shortness of breath on exertion Kendra [name] notes increasing shortness of breath with exercising and seems to be worsening over time with weight gain. She notes getting out of breath sooner with activity than she used to. This has gotten worse recently. [name] denies shortness of breath at rest or orthopnea. EKG- NSR with poor R-wave progression; 84 bpm.  3. Typical absence seizures, refractory (HCC) Kendra [name] is on Lamictal BID. Her seizures are well controlled. She sees [facility] for monitoring.  4. Non-seasonal allergic rhinitis due to other allergic trigger Kendra [name] is on Zyrtec, Symbicort, and fluticasone. Her symptoms are reasonably well controlled with meds.  5. Hyperglycemia Kendra [name] BS have been elevated in the past. No A1c on file.  6. Anxiety and depression Kendra [name] denies suicidal or homicidal ideations. She feels depression is reasonably controlled with Lamictal but anxiety is not the  most controlled. She is not on medication for anxiety.  7. Vitamin D deficiency Diagnosis likely given obesity. Kendra [name] reports fatigue.  Assessment/Plan:   1. Other fatigue Kendra [name] does feel that her weight is causing her energy to be lower than it should be. Fatigue may be related to obesity, depression or many other causes. Labs will be ordered, and in  the meanwhile, Kendra [name] will focus on self care including making healthy food choices, increasing physical activity and focusing on stress reduction. Check labs today.  - EKG 12-Lead - T3 - T4 - TSH  2. Shortness of breath on exertion Kendra [name] does feel that she gets out of breath more easily that she used to when she exercises. Kendra [name]'s shortness of breath appears to be obesity related and exercise induced. She has agreed to work on weight loss and gradually increase exercise to treat her exercise induced shortness of breath. Will continue to monitor closely. Check labs today.  - Lipid Panel With LDL/HDL Ratio  3. Typical absence seizures, refractory (HCC) Check CMP today.  4. Non-seasonal allergic rhinitis due to other allergic trigger Follow up on symptoms at next appt.  5. Hyperglycemia Fasting labs will be obtained and results with be discussed with Kendra [name] in 2 weeks at her follow up visit. In the meanwhile Kendra [name] was started on a lower simple carbohydrate diet and will work on weight loss efforts. Check labs today. - Hemoglobin A1c - Insulin, random  6. Anxiety and depression Behavior modification techniques were discussed today to help Kendra [name] deal with her anxiety.  Orders and follow up as documented in patient record. Follow up at next appt.  7. Vitamin D deficiency Low Vitamin D level contributes to fatigue and are associated with obesity, breast, and colon cancer. She agrees to follow-up for routine testing of Vitamin D, at least 2-3 times per year to avoid over-replacement. Check labs today.  - VITAMIN D 25 Hydroxy (Vit-D Deficiency, Fractures)  8. Screening for depression [name] had a positive depression screening. Depression is commonly associated with obesity and often results in emotional eating behaviors. We will monitor this closely and work on CBT to help improve the non-hunger eating patterns. Referral to Psychology may be required if no improvement is seen as she continues in our  clinic.  9. Class 3 severe obesity with serious comorbidity and body mass index (BMI) of 50.0 to 59.9 in adult, unspecified obesity type (Kendra [name])  Kendra [name] is currently in the action stage of change and her goal is to continue with weight loss efforts. I recommend Kendra [name] begin the structured treatment plan as follows:  She has agreed to the Category 4 Plan.  Exercise goals: No exercise has been prescribed at this time.   Behavioral modification strategies: increasing lean protein intake, meal planning and cooking strategies, keeping healthy foods in the home and planning for success.  She was informed of the importance of frequent follow-up visits to maximize her success with intensive lifestyle modifications for her multiple health conditions. She was informed we would discuss her lab results at her next visit unless there is a critical issue that needs to be addressed sooner. [place] agreed to keep her next visit at the agreed upon time to discuss these results.  Objective:   Blood pressure 107/63, pulse 81, temperature 98.6 F (37 C), height 5' (1.524 m), weight 258 lb (117 kg), SpO2 96 %. Body mass index is 50.39 kg/m.  EKG: Normal Sinus rhythm, rate 84, with poor R-wave progression.  Indirect Calorimeter completed  today shows a VO2 of 326 and a REE of 2271.  Her calculated basal metabolic rate is 0623 thus her basal metabolic rate is better than expected.  General: Cooperative, alert, well developed, in no acute distress. HEENT: Conjunctivae and lids unremarkable. Cardiovascular: Regular rhythm.  Lungs: Normal work of breathing. Neurologic: No focal deficits.   Lab Results  Component Value Date   CREATININE 0.81 11/19/2020   BUN 10 11/19/2020   NA 138 11/19/2020   K 4.5 11/19/2020   CL 100 11/19/2020   CO2 22 11/19/2020   Lab Results  Component Value Date   ALT 14 11/19/2020   AST 12 11/19/2020   ALKPHOS 101 11/19/2020   BILITOT 0.3 11/19/2020   Lab Results  Component Value  Date   HGBA1C 5.6 01/13/2021   Lab Results  Component Value Date   INSULIN 6.8 01/13/2021   Lab Results  Component Value Date   TSH 2.640 01/13/2021   Lab Results  Component Value Date   CHOL 262 (H) 01/13/2021   HDL 52 01/13/2021   LDLCALC 173 (H) 01/13/2021   TRIG 198 (H) 01/13/2021   Lab Results  Component Value Date   WBC 8.1 11/19/2020   HGB 13.7 11/19/2020   HCT 41.1 11/19/2020   MCV 92 11/19/2020   PLT 476 (H) 11/19/2020    Attestation Statements:   Reviewed by clinician on day of visit: allergies, medications, problem list, medical history, surgical history, family history, social history, and previous encounter notes.  Time spent on visit including pre-visit chart review and post-visit charting and care was 60 minutes.   Coral Ceo, CMA, am acting as transcriptionist for Coralie Common, MD.   This is the patient's first visit at [facility]. The patient's NEW PATIENT PACKET was reviewed at length. Included in the packet: current and past health history, medications, allergies, ROS, gynecologic history (women only), surgical history, family history, social history, weight history, weight loss surgery history (for those that have had weight loss surgery), nutritional evaluation, mood and food questionnaire, PHQ9, Epworth questionnaire, sleep habits questionnaire, patient life and health improvement goals questionnaire. These will all be scanned into the patient's chart under media.   During the visit, I independently reviewed the patient's EKG, bioimpedance scale results, and indirect calorimeter results. I used this information to tailor a meal plan for the patient that will help her to lose weight and will improve her obesity-related conditions going forward. I performed a medically necessary appropriate examination and/or evaluation. I discussed the assessment and treatment plan with the patient. The patient was provided an opportunity to ask  questions and all were answered. The patient agreed with the plan and demonstrated an understanding of the instructions. Labs were ordered at this visit and will be reviewed at the next visit unless more critical results need to be addressed immediately. Clinical information was updated and documented in the EMR.   Time spent on visit including pre-visit chart review and post-visit care was 60 minutes.   A separate 15 minutes was spent on risk counseling (see above).  I have reviewed the above documentation for accuracy and completeness, and I agree with the above. - Jinny Blossom, MD

## 2021-01-20 ENCOUNTER — Ambulatory Visit: Payer: Medicaid Other | Admitting: Podiatry

## 2021-01-27 ENCOUNTER — Encounter (INDEPENDENT_AMBULATORY_CARE_PROVIDER_SITE_OTHER): Payer: Self-pay

## 2021-01-27 ENCOUNTER — Ambulatory Visit (INDEPENDENT_AMBULATORY_CARE_PROVIDER_SITE_OTHER): Payer: Medicaid Other | Admitting: Family Medicine

## 2021-02-07 ENCOUNTER — Encounter (INDEPENDENT_AMBULATORY_CARE_PROVIDER_SITE_OTHER): Payer: Self-pay

## 2021-02-10 ENCOUNTER — Ambulatory Visit (INDEPENDENT_AMBULATORY_CARE_PROVIDER_SITE_OTHER): Payer: Medicaid Other | Admitting: Family Medicine

## 2021-02-17 ENCOUNTER — Other Ambulatory Visit: Payer: Self-pay

## 2021-02-17 ENCOUNTER — Ambulatory Visit (INDEPENDENT_AMBULATORY_CARE_PROVIDER_SITE_OTHER): Payer: Medicaid Other | Admitting: Family Medicine

## 2021-02-17 ENCOUNTER — Encounter (INDEPENDENT_AMBULATORY_CARE_PROVIDER_SITE_OTHER): Payer: Self-pay | Admitting: Family Medicine

## 2021-02-17 VITALS — BP 125/82 | HR 90 | Temp 98.3°F | Ht 60.0 in | Wt 259.0 lb

## 2021-02-17 DIAGNOSIS — E559 Vitamin D deficiency, unspecified: Secondary | ICD-10-CM

## 2021-02-17 DIAGNOSIS — E8881 Metabolic syndrome: Secondary | ICD-10-CM

## 2021-02-17 DIAGNOSIS — Z6841 Body Mass Index (BMI) 40.0 and over, adult: Secondary | ICD-10-CM

## 2021-02-17 DIAGNOSIS — F419 Anxiety disorder, unspecified: Secondary | ICD-10-CM

## 2021-02-17 DIAGNOSIS — E782 Mixed hyperlipidemia: Secondary | ICD-10-CM | POA: Diagnosis not present

## 2021-02-17 DIAGNOSIS — F32A Depression, unspecified: Secondary | ICD-10-CM

## 2021-02-17 MED ORDER — VITAMIN D (ERGOCALCIFEROL) 1.25 MG (50000 UNIT) PO CAPS: 50000.0000 [IU] | ORAL_CAPSULE | ORAL | 0 refills | Status: DC

## 2021-02-18 ENCOUNTER — Ambulatory Visit (INDEPENDENT_AMBULATORY_CARE_PROVIDER_SITE_OTHER): Payer: Medicaid Other | Admitting: Family Medicine

## 2021-02-18 NOTE — Progress Notes (Signed)
Chief Complaint:   OBESITY Kendra [name] is here to discuss her progress with her obesity treatment plan along with follow-up of her obesity related diagnoses. Kendra [name] is on the Category 3 Plan and states she is following her eating plan approximately 50% of the time. Kendra [name] states she is dancing 30 minutes 3 times per week.  Today's visit was #: 2 Starting weight: 258 lbs Starting date: 01/13/2021 Today's weight: 259 lbs Today's date: 02/17/2021 Total lbs lost to date: 0 Total lbs lost since last in-office visit: 0  Interim History: Kendra [name] has decreased eating after 9 PM and has started exercising. She is not eating as much as she should. She wants to start meal prep to make following meal plan a bit easier. Not eating much protein. No upcoming plans.  Subjective:   1. Vitamin D deficiency Kendra [name] is not on vitamin supplementation. She reports fatigue. Her Vit D level is 16.3.  2. Mixed hyperlipidemia Kendra [name] has an LDL of 173, HDL 52, and triglycerides 198. She is not on medication. Can't risk stratify due to age.   3. Anxiety and depression Kendra [name] denies suicidal or homicidal ideations. Is on Lamictal 100 mg BID. Pt voices she is struggling with feeling overwhelmed and stressed.  4. Insulin resistance Kendra [name]'s last A1c was 5.6 and insulin level 6.8. She is not on medication.  Assessment/Plan:   1. Vitamin D deficiency Low Vitamin D level contributes to fatigue and are associated with obesity, breast, and colon cancer. She agrees to continue to take prescription Vitamin D @50 ,000 IU every week and will follow-up for routine testing of Vitamin D, at least 2-3 times per year to avoid over-replacement.  - Vitamin D, Ergocalciferol, (DRISDOL) 1.25 MG (50000 UNIT) CAPS capsule; Take 1 capsule (50,000 Units total) by mouth every 7 (seven) days.  Dispense: 4 capsule; Refill: 0  2. Mixed hyperlipidemia Cardiovascular risk and specific lipid/LDL goals reviewed.  We discussed several lifestyle modifications  today and Kendra [name] will continue to work on diet, exercise and weight loss efforts. Orders and follow up as documented in patient record. Follow up [facility] in 3 months.  Counseling Intensive lifestyle modifications are the first line treatment for this issue. Dietary changes: Increase soluble fiber. Decrease simple carbohydrates. Exercise changes: Moderate to vigorous-intensity aerobic activity 150 minutes per week if tolerated. Lipid-lowering medications: see documented in medical record.  3. Anxiety and depression Behavior modification techniques were discussed today to help Kendra [name] deal with her anxiety.  Orders and follow up as documented in patient record. Follow up at next appt. Discussed addition of SSRI with pt- she will message neurology provider.  4. Insulin resistance Kendra [name] will continue to work on weight loss, exercise, and decreasing simple carbohydrates to help decrease the risk of diabetes. Kendra [name] agreed to follow-up with [place] as directed to closely monitor her progress.follow up labs in 3 months. No meds at this time. Continue category 4 plan.  5. Class 3 severe obesity with serious comorbidity and body mass index (BMI) of 50.0 to 59.9 in adult, unspecified obesity type ([place])  Kendra [name] is currently in the action stage of change. As such, her goal is to continue with weight loss efforts. She has agreed to the Category 4 Plan.   Exercise goals: All adults should avoid inactivity. Some physical activity is better than none, and adults who participate in any amount of physical activity gain some health benefits.  Behavioral modification strategies: increasing lean protein intake, meal planning and cooking strategies, keeping healthy foods in  the home, and planning for success.  Kendra [name] has agreed to follow-up with our clinic in 2-3 weeks. She was informed of the importance of frequent follow-up visits to maximize her success with intensive lifestyle modifications for her multiple health conditions.    Objective:   Blood pressure 125/82, pulse 90, temperature 98.3 F (36.8 C), height 5' (1.524 m), weight 259 lb (117.5 kg), SpO2 97 %. Body mass index is 50.58 kg/m.  General: Cooperative, alert, well developed, in no acute distress. HEENT: Conjunctivae and lids unremarkable. Cardiovascular: Regular rhythm.  Lungs: Normal work of breathing. Neurologic: No focal deficits.   Lab Results  Component Value Date   CREATININE 0.81 11/19/2020   BUN 10 11/19/2020   NA 138 11/19/2020   K 4.5 11/19/2020   CL 100 11/19/2020   CO2 22 11/19/2020   Lab Results  Component Value Date   ALT 14 11/19/2020   AST 12 11/19/2020   ALKPHOS 101 11/19/2020   BILITOT 0.3 11/19/2020   Lab Results  Component Value Date   HGBA1C 5.6 01/13/2021   Lab Results  Component Value Date   INSULIN 6.8 01/13/2021   Lab Results  Component Value Date   TSH 2.640 01/13/2021   Lab Results  Component Value Date   CHOL 262 (H) 01/13/2021   HDL 52 01/13/2021   LDLCALC 173 (H) 01/13/2021   TRIG 198 (H) 01/13/2021   Lab Results  Component Value Date   WBC 8.1 11/19/2020   HGB 13.7 11/19/2020   HCT 41.1 11/19/2020   MCV 92 11/19/2020   PLT 476 (H) 11/19/2020   No results found for: IRON, TIBC, FERRITIN  Attestation Statements:   Reviewed by clinician on day of visit: allergies, medications, problem list, medical history, surgical history, family history, social history, and previous encounter notes.  Time spent on visit including pre-visit chart review and post-visit care and charting was 30 minutes.   Coral Ceo, CMA, am acting as transcriptionist for Coralie Common, MD.   I have reviewed the above documentation for accuracy and completeness, and I agree with the above. - Jinny Blossom, MD

## 2021-02-20 ENCOUNTER — Encounter (INDEPENDENT_AMBULATORY_CARE_PROVIDER_SITE_OTHER): Payer: Self-pay | Admitting: Family Medicine

## 2021-03-11 ENCOUNTER — Other Ambulatory Visit: Payer: Self-pay

## 2021-03-11 ENCOUNTER — Encounter (INDEPENDENT_AMBULATORY_CARE_PROVIDER_SITE_OTHER): Payer: Self-pay | Admitting: Family Medicine

## 2021-03-11 ENCOUNTER — Ambulatory Visit (INDEPENDENT_AMBULATORY_CARE_PROVIDER_SITE_OTHER): Payer: Medicaid Other | Admitting: Family Medicine

## 2021-03-11 VITALS — BP 119/75 | HR 87 | Temp 97.9°F | Ht 60.0 in | Wt 262.0 lb

## 2021-03-11 DIAGNOSIS — E782 Mixed hyperlipidemia: Secondary | ICD-10-CM

## 2021-03-11 DIAGNOSIS — R600 Localized edema: Secondary | ICD-10-CM | POA: Diagnosis not present

## 2021-03-11 DIAGNOSIS — E559 Vitamin D deficiency, unspecified: Secondary | ICD-10-CM

## 2021-03-11 DIAGNOSIS — Z6841 Body Mass Index (BMI) 40.0 and over, adult: Secondary | ICD-10-CM

## 2021-03-11 MED ORDER — VITAMIN D (ERGOCALCIFEROL) 1.25 MG (50000 UNIT) PO CAPS: 50000.0000 [IU] | ORAL_CAPSULE | ORAL | 0 refills | Status: DC

## 2021-03-17 NOTE — Progress Notes (Signed)
Chief Complaint:   OBESITY Kendra [name] is here to discuss her progress with her obesity treatment plan along with follow-up of her obesity related diagnoses. Kendra [name] is on the Category 4 Plan and states she is following her eating plan approximately 100% of the time. Kendra [name] states she is walking and cardio classes 30-60 minutes 3 times per week.  Today's visit was #: 3 Starting weight: 258 lbs Starting date: 01/13/2021 Today's weight: 262 lbs Today's date: 03/11/2021 Total lbs lost to date: 0 Total lbs lost since last in-office visit: 0  Interim History: Kendra [name] has been walking more. She is following the plan and working on getting the food on plan in just not eating all food. She is eating 3 times a day. Pt is often skipping dinner or eating vegetable for dinner without much protein. She has family coming into town this week.  Subjective:   1. Vitamin D deficiency Pt denies nausea, vomiting, and muscle weakness but notes fatigue. Pt is on prescription Vit D. Her last Vit D level was 16.3.  2. Mixed hyperlipidemia Kendra [name] has an LDL of 173, HDL 52, and triglycerides 198. She is not on a statin.  3. Edema of left lower extremity Kendra [name] has edema is on foot and ankle. She had recent normal Echo (March 2022).  Assessment/Plan:   1. Vitamin D deficiency Low Vitamin D level contributes to fatigue and are associated with obesity, breast, and colon cancer. She agrees to continue to take prescription Vitamin D @50 ,000 IU every week and will follow-up for routine testing of Vitamin D, at least 2-3 times per year to avoid over-replacement.  Refill- Vitamin D, Ergocalciferol, (DRISDOL) 1.25 MG (50000 UNIT) CAPS capsule; Take 1 capsule (50,000 Units total) by mouth every 7 (seven) days.  Dispense: 8 capsule; Refill: 0  2. Mixed hyperlipidemia Cardiovascular risk and specific lipid/LDL goals reviewed.  We discussed several lifestyle modifications today and Kendra [name] will continue to work on diet, exercise and  weight loss efforts. Orders and follow up as documented in patient record. Repeat labs in September.  Counseling Intensive lifestyle modifications are the first line treatment for this issue. Dietary changes: Increase soluble fiber. Decrease simple carbohydrates. Exercise changes: Moderate to vigorous-intensity aerobic activity 150 minutes per week if tolerated. Lipid-lowering medications: see documented in medical record.  3. Edema of left lower extremity Follow up at next appt. Pt is encouraged to wear medical grade compression stockings.  4. Class 3 severe obesity with serious comorbidity and body mass index (BMI) of 50.0 to 59.9 in adult, unspecified obesity type (Kendra [name])  Kendra [name] is currently in the action stage of change. As such, her goal is to continue with weight loss efforts. She has agreed to the Category 4 Plan.   Exercise goals:  As is  Behavioral modification strategies: increasing lean protein intake, no skipping meals, meal planning and cooking strategies, and planning for success.  Kendra [name] has agreed to follow-up with our clinic in 2 weeks. She was informed of the importance of frequent follow-up visits to maximize her success with intensive lifestyle modifications for her multiple health conditions.   Objective:   Blood pressure 119/75, pulse 87, temperature 97.9 F (36.6 C), height 5' (1.524 m), weight 262 lb (118.8 kg), SpO2 97 %. Body mass index is 51.17 kg/m.  General: Cooperative, alert, well developed, in no acute distress. HEENT: Conjunctivae and lids unremarkable. Cardiovascular: Regular rhythm.  Lungs: Normal work of breathing. Neurologic: No focal deficits.   Lab Results  Component Value  Date   CREATININE 0.81 11/19/2020   BUN 10 11/19/2020   NA 138 11/19/2020   K 4.5 11/19/2020   CL 100 11/19/2020   CO2 22 11/19/2020   Lab Results  Component Value Date   ALT 14 11/19/2020   AST 12 11/19/2020   ALKPHOS 101 11/19/2020   BILITOT 0.3 11/19/2020   Lab  Results  Component Value Date   HGBA1C 5.6 01/13/2021   Lab Results  Component Value Date   INSULIN 6.8 01/13/2021   Lab Results  Component Value Date   TSH 2.640 01/13/2021   Lab Results  Component Value Date   CHOL 262 (H) 01/13/2021   HDL 52 01/13/2021   LDLCALC 173 (H) 01/13/2021   TRIG 198 (H) 01/13/2021   Lab Results  Component Value Date   VD25OH 16.3 (L) 01/13/2021   Lab Results  Component Value Date   WBC 8.1 11/19/2020   HGB 13.7 11/19/2020   HCT 41.1 11/19/2020   MCV 92 11/19/2020   PLT 476 (H) 11/19/2020   No results found for: IRON, TIBC, FERRITIN  Attestation Statements:   Reviewed by clinician on day of visit: allergies, medications, problem list, medical history, surgical history, family history, social history, and previous encounter notes.  Time spent on visit including pre-visit chart review and post-visit care and charting was 25 minutes.   Coral Ceo, CMA, am acting as transcriptionist for Coralie Common, MD.   I have reviewed the above documentation for accuracy and completeness, and I agree with the above. - Coralie Common, MD

## 2021-03-30 ENCOUNTER — Encounter (INDEPENDENT_AMBULATORY_CARE_PROVIDER_SITE_OTHER): Payer: Self-pay | Admitting: Family Medicine

## 2021-03-30 ENCOUNTER — Other Ambulatory Visit: Payer: Self-pay

## 2021-03-30 ENCOUNTER — Ambulatory Visit (INDEPENDENT_AMBULATORY_CARE_PROVIDER_SITE_OTHER): Payer: Medicaid Other | Admitting: Family Medicine

## 2021-03-30 VITALS — BP 131/83 | HR 80 | Temp 98.5°F | Ht 60.0 in | Wt 258.0 lb

## 2021-03-30 DIAGNOSIS — E559 Vitamin D deficiency, unspecified: Secondary | ICD-10-CM

## 2021-03-30 DIAGNOSIS — J3089 Other allergic rhinitis: Secondary | ICD-10-CM

## 2021-03-30 DIAGNOSIS — Z6841 Body Mass Index (BMI) 40.0 and over, adult: Secondary | ICD-10-CM | POA: Diagnosis not present

## 2021-03-30 NOTE — Telephone Encounter (Signed)
Please advise 

## 2021-03-31 NOTE — Progress Notes (Signed)
Chief Complaint:   OBESITY Kendra [name] is here to discuss her progress with her obesity treatment plan along with follow-up of her obesity related diagnoses. Kendra [name] is on the Category 4 Plan and states she is following her eating plan approximately 100% of the time. Kendra [name] states she is walking 20-30 minutes 2-3 times per week.  Today's visit was #: 4 Starting weight: 258 lbs Starting date: 01/13/2021 Today's weight: 258 lbs Today's date: 03/30/2021 Total lbs lost to date: 0 Total lbs lost since last in-office visit: 4  Interim History: Kendra [name] has changed up eating patterns. Her coworker commented on how little she eats and so she has made conscious efforts to increase food intake. She has been cutting back on carbs. She has been eating more salad and Healthy Choice meals. She thinks she is getting in a good amount of protein, but is not necessarily weighing protein. Pt is trying to plan a beach trip.  Subjective:   1. Non-seasonal allergic rhinitis due to other allergic trigger Sharra's symptoms are well controlled on Zyrtec and Flonase.  2. Vitamin D deficiency Kendra [name] denies nausea, vomiting, and muscle weakness but notes fatigue. She is on prescription Vit D.  Assessment/Plan:   1. Non-seasonal allergic rhinitis due to other allergic trigger Continue current meds as directed.  2. Vitamin D deficiency Low Vitamin D level contributes to fatigue and are associated with obesity, breast, and colon cancer. She agrees to continue to take prescription Vitamin D '@50'$ ,000 IU every week and will follow-up for routine testing of Vitamin D, at least 2-3 times per year to avoid over-replacement.  3. Obesity with current BMI of 50.4  Kendra [name] is currently in the action stage of change. As such, her goal is to continue with weight loss efforts. She has agreed to the Category 4 Plan.   Exercise goals:  As is  Behavioral modification strategies: increasing lean protein intake.  Barby has agreed to follow-up  with our clinic in 3 weeks. She was informed of the importance of frequent follow-up visits to maximize her success with intensive lifestyle modifications for her multiple health conditions.   Objective:   Blood pressure 131/83, pulse 80, temperature 98.5 F (36.9 C), height 5' (1.524 m), weight 258 lb (117 kg), last menstrual period 03/02/2021, SpO2 98 %. Body mass index is 50.39 kg/m.  General: Cooperative, alert, well developed, in no acute distress. HEENT: Conjunctivae and lids unremarkable. Cardiovascular: Regular rhythm.  Lungs: Normal work of breathing. Neurologic: No focal deficits.   Lab Results  Component Value Date   CREATININE 0.81 11/19/2020   BUN 10 11/19/2020   NA 138 11/19/2020   K 4.5 11/19/2020   CL 100 11/19/2020   CO2 22 11/19/2020   Lab Results  Component Value Date   ALT 14 11/19/2020   AST 12 11/19/2020   ALKPHOS 101 11/19/2020   BILITOT 0.3 11/19/2020   Lab Results  Component Value Date   HGBA1C 5.6 01/13/2021   Lab Results  Component Value Date   INSULIN 6.8 01/13/2021   Lab Results  Component Value Date   TSH 2.640 01/13/2021   Lab Results  Component Value Date   CHOL 262 (H) 01/13/2021   HDL 52 01/13/2021   LDLCALC 173 (H) 01/13/2021   TRIG 198 (H) 01/13/2021   Lab Results  Component Value Date   VD25OH 16.3 (L) 01/13/2021   Lab Results  Component Value Date   WBC 8.1 11/19/2020   HGB 13.7 11/19/2020   HCT  41.1 11/19/2020   MCV 92 11/19/2020   PLT 476 (H) 11/19/2020     Attestation Statements:   Reviewed by clinician on day of visit: allergies, medications, problem list, medical history, surgical history, family history, social history, and previous encounter notes.  Time spent on visit including pre-visit chart review and post-visit care and charting was 12 minutes.   Coral Ceo, CMA, am acting as transcriptionist for Coralie Common, MD.  I have reviewed the above documentation for accuracy and completeness,  and I agree with the above. - Coralie Common, MD

## 2021-04-01 ENCOUNTER — Ambulatory Visit (INDEPENDENT_AMBULATORY_CARE_PROVIDER_SITE_OTHER): Payer: Medicaid Other | Admitting: Family Medicine

## 2021-04-07 ENCOUNTER — Other Ambulatory Visit: Payer: Self-pay | Admitting: Adult Health

## 2021-04-13 ENCOUNTER — Encounter (INDEPENDENT_AMBULATORY_CARE_PROVIDER_SITE_OTHER): Payer: Self-pay | Admitting: Family Medicine

## 2021-04-27 ENCOUNTER — Ambulatory Visit (INDEPENDENT_AMBULATORY_CARE_PROVIDER_SITE_OTHER): Payer: Medicaid Other | Admitting: Family Medicine

## 2021-04-27 ENCOUNTER — Other Ambulatory Visit: Payer: Self-pay

## 2021-04-27 ENCOUNTER — Encounter (INDEPENDENT_AMBULATORY_CARE_PROVIDER_SITE_OTHER): Payer: Self-pay | Admitting: Family Medicine

## 2021-04-27 VITALS — BP 114/80 | HR 89 | Temp 98.2°F | Ht 60.0 in | Wt 260.0 lb

## 2021-04-27 DIAGNOSIS — E559 Vitamin D deficiency, unspecified: Secondary | ICD-10-CM

## 2021-04-27 DIAGNOSIS — Z6841 Body Mass Index (BMI) 40.0 and over, adult: Secondary | ICD-10-CM | POA: Diagnosis not present

## 2021-04-27 DIAGNOSIS — K5909 Other constipation: Secondary | ICD-10-CM

## 2021-04-27 MED ORDER — VITAMIN D (ERGOCALCIFEROL) 1.25 MG (50000 UNIT) PO CAPS: 50000.0000 [IU] | ORAL_CAPSULE | ORAL | 0 refills | Status: DC

## 2021-04-27 NOTE — Progress Notes (Signed)
Chief Complaint:   OBESITY Kendra [name] is here to discuss her progress with her obesity treatment plan along with follow-up of her obesity related diagnoses. Kendra [name] is on the Category 4 Plan and states she is following her eating plan approximately 50% of the time. Kendra [name] states she is walking 30 minutes 3 times per week.  Today's visit was #: 5 Starting weight: 258 lbs Starting date: 01/13/2021 Today's weight: 260 lbs Today's date: 04/27/2021 Total lbs lost to date: 0 Total lbs lost since last in-office visit: 0  Interim History: Kendra [name] is trying to get breakfast in every morning. She started a new job and is hoping to be more on a routine (going to start at [employer]) and hoping to have increase in structure. Her biggest obstacle in the next few weeks is trying to keep eating. She is sometimes only eating twice a day.  Subjective:   1. Vitamin D deficiency Kendra [name] denies nausea, vomiting, and muscle weakness but notes fatigue. Pt is on prescription Vit D. Her Vit D level is 16.8.  2. Other constipation She is noticing an increase in irregular of bowel movements and increase in frequency.  Assessment/Plan:   1. Vitamin D deficiency Low Vitamin D level contributes to fatigue and are associated with obesity, breast, and colon cancer. She agrees to continue to take prescription Vitamin D 50,000 IU every week and will follow-up for routine testing of Vitamin D, at least 2-3 times per year to avoid over-replacement.  Refill- Vitamin D, Ergocalciferol, (DRISDOL) 1.25 MG (50000 UNIT) CAPS capsule; Take 1 capsule (50,000 Units total) by mouth every 7 (seven) days.  Dispense: 8 capsule; Refill: 0  2. Other constipation Kendra [name] will start Miralax daily. She was informed that a decrease in bowel movement frequency is normal while losing weight, but stools should not be hard or painful. Orders and follow up as documented in patient record.   Counseling Getting to Good Bowel Health: Your goal is to  have one soft bowel movement each day. Drink at least 8 glasses of water each day. Eat plenty of fiber (goal is over 25 grams each day). It is best to get most of your fiber from dietary sources which includes leafy green vegetables, fresh fruit, and whole grains. You may need to add fiber with the help of OTC fiber supplements. These include Metamucil, Citrucel, and Flaxseed. If you are still having trouble, try adding Miralax or Magnesium Citrate. If all of these changes do not work, Cabin crew.  3. Obesity with current BMI of 50.9  Kendra [name] is currently in the action stage of change. As such, her goal is to continue with weight loss efforts. She has agreed to the Category 4 Plan.   Exercise goals:  As is  Behavioral modification strategies: increasing lean protein intake, meal planning and cooking strategies, keeping healthy foods in the home, and planning for success.  Kendra [name] has agreed to follow-up with our clinic in 3 weeks. She was informed of the importance of frequent follow-up visits to maximize her success with intensive lifestyle modifications for her multiple health conditions.   Objective:   Blood pressure 114/80, pulse 89, temperature 98.2 F (36.8 C), height 5' (1.524 m), weight 260 lb (117.9 kg), last menstrual period 04/06/2021, SpO2 98 %. Body mass index is 50.78 kg/m.  General: Cooperative, alert, well developed, in no acute distress. HEENT: Conjunctivae and lids unremarkable. Cardiovascular: Regular rhythm.  Lungs: Normal work of breathing. Neurologic: No focal deficits.  Lab Results  Component Value Date   CREATININE 0.81 11/19/2020   BUN 10 11/19/2020   NA 138 11/19/2020   K 4.5 11/19/2020   CL 100 11/19/2020   CO2 22 11/19/2020   Lab Results  Component Value Date   ALT 14 11/19/2020   AST 12 11/19/2020   ALKPHOS 101 11/19/2020   BILITOT 0.3 11/19/2020   Lab Results  Component Value Date   HGBA1C 5.6 01/13/2021   Lab Results  Component Value  Date   INSULIN 6.8 01/13/2021   Lab Results  Component Value Date   TSH 2.640 01/13/2021   Lab Results  Component Value Date   CHOL 262 (H) 01/13/2021   HDL 52 01/13/2021   LDLCALC 173 (H) 01/13/2021   TRIG 198 (H) 01/13/2021   Lab Results  Component Value Date   VD25OH 16.3 (L) 01/13/2021   Lab Results  Component Value Date   WBC 8.1 11/19/2020   HGB 13.7 11/19/2020   HCT 41.1 11/19/2020   MCV 92 11/19/2020   PLT 476 (H) 11/19/2020    Attestation Statements:   Reviewed by clinician on day of visit: allergies, medications, problem list, medical history, surgical history, family history, social history, and previous encounter notes.  Time spent on visit including pre-visit chart review and post-visit care and charting was 25 minutes.   Coral Ceo, CMA, am acting as transcriptionist for Coralie Common, MD.   I have reviewed the above documentation for accuracy and completeness, and I agree with the above. - Coralie Common, MD

## 2021-05-18 ENCOUNTER — Ambulatory Visit (INDEPENDENT_AMBULATORY_CARE_PROVIDER_SITE_OTHER): Payer: Medicaid Other | Admitting: Family Medicine

## 2021-05-18 ENCOUNTER — Other Ambulatory Visit: Payer: Self-pay

## 2021-05-18 ENCOUNTER — Encounter (INDEPENDENT_AMBULATORY_CARE_PROVIDER_SITE_OTHER): Payer: Self-pay | Admitting: Family Medicine

## 2021-05-18 VITALS — BP 135/87 | HR 99 | Temp 98.3°F | Ht 60.0 in | Wt 258.0 lb

## 2021-05-18 DIAGNOSIS — E559 Vitamin D deficiency, unspecified: Secondary | ICD-10-CM

## 2021-05-18 DIAGNOSIS — Z6841 Body Mass Index (BMI) 40.0 and over, adult: Secondary | ICD-10-CM

## 2021-05-18 DIAGNOSIS — E66813 Obesity, class 3: Secondary | ICD-10-CM

## 2021-05-18 DIAGNOSIS — E7849 Other hyperlipidemia: Secondary | ICD-10-CM

## 2021-05-18 MED ORDER — VITAMIN D (ERGOCALCIFEROL) 1.25 MG (50000 UNIT) PO CAPS: 50000.0000 [IU] | ORAL_CAPSULE | ORAL | 0 refills | Status: DC

## 2021-05-18 NOTE — Progress Notes (Signed)
Chief Complaint:   OBESITY Kendra [name] is here to discuss her progress with her obesity treatment plan along with follow-up of her obesity related diagnoses. Kendra [name] is on the Category 4 Plan and states she is following her eating plan approximately 75% of the time. Kendra [name] states she is walking and going to the gym 30-45 minutes 2 times per week.  Today's visit was #: 6 Starting weight: 258 lbs Starting date: 01/13/2021 Today's weight: 258 lbs Today's date: 05/18/2021 Total lbs lost to date: 0 Total lbs lost since last in-office visit: 2  Interim History: Kendra [name] restarted a new job- 2 days on and 3 days off, 3 days on 2 days off. She has been focused on getting food in and walking with her kids in her mom's neighborhood. She eats at the hospital, as she gets free meals there. The next few weeks, she has more of the same. Pt is to consider bariatric surgery.  Subjective:   1. Vitamin D deficiency Kendra [name] denies nausea, vomiting, and muscle weakness but notes fatigue. Pt is on prescription Vit D.  2. Other hyperlipidemia Her last LDL was 173, HDL 52, and triglycerides 198. She is not on statin therapy.  Assessment/Plan:   1. Vitamin D deficiency Low Vitamin D level contributes to fatigue and are associated with obesity, breast, and colon cancer. She agrees to continue to take prescription Vitamin D 50,000 IU every week and will follow-up for routine testing of Vitamin D, at least 2-3 times per year to avoid over-replacement.  Refill- Vitamin D, Ergocalciferol, (DRISDOL) 1.25 MG (50000 UNIT) CAPS capsule; Take 1 capsule (50,000 Units total) by mouth every 7 (seven) days.  Dispense: 8 capsule; Refill: 0  2. Other hyperlipidemia Cardiovascular risk and specific lipid/LDL goals reviewed.  We discussed several lifestyle modifications today and Kendra [name] will continue to work on diet, exercise and weight loss efforts. Orders and follow up as documented in patient record. Check labs in mid  October.  Counseling Intensive lifestyle modifications are the first line treatment for this issue. Dietary changes: Increase soluble fiber. Decrease simple carbohydrates. Exercise changes: Moderate to vigorous-intensity aerobic activity 150 minutes per week if tolerated. Lipid-lowering medications: see documented in medical record.  3. Obesity with current BMI of 50.5  Kendra [name] is currently in the action stage of change. As such, her goal is to continue with weight loss efforts. She has agreed to the Category 4 Plan.   Exercise goals: All adults should avoid inactivity. Some physical activity is better than none, and adults who participate in any amount of physical activity gain some health benefits.  Behavioral modification strategies: increasing lean protein intake, meal planning and cooking strategies, keeping healthy foods in the home, and planning for success.  Kendra [name] has agreed to follow-up with our clinic in 2 weeks. She was informed of the importance of frequent follow-up visits to maximize her success with intensive lifestyle modifications for her multiple health conditions.   Objective:   Blood pressure 135/87, pulse 99, temperature 98.3 F (36.8 C), height 5' (1.524 m), weight 258 lb (117 kg), last menstrual period 05/13/2021, SpO2 97 %. Body mass index is 50.39 kg/m.  General: Cooperative, alert, well developed, in no acute distress. HEENT: Conjunctivae and lids unremarkable. Cardiovascular: Regular rhythm.  Lungs: Normal work of breathing. Neurologic: No focal deficits.   Lab Results  Component Value Date   CREATININE 0.81 11/19/2020   BUN 10 11/19/2020   NA 138 11/19/2020   K 4.5 11/19/2020   CL  100 11/19/2020   CO2 22 11/19/2020   Lab Results  Component Value Date   ALT 14 11/19/2020   AST 12 11/19/2020   ALKPHOS 101 11/19/2020   BILITOT 0.3 11/19/2020   Lab Results  Component Value Date   HGBA1C 5.6 01/13/2021   Lab Results  Component Value Date    INSULIN 6.8 01/13/2021   Lab Results  Component Value Date   TSH 2.640 01/13/2021   Lab Results  Component Value Date   CHOL 262 (H) 01/13/2021   HDL 52 01/13/2021   LDLCALC 173 (H) 01/13/2021   TRIG 198 (H) 01/13/2021   Lab Results  Component Value Date   VD25OH 16.3 (L) 01/13/2021   Lab Results  Component Value Date   WBC 8.1 11/19/2020   HGB 13.7 11/19/2020   HCT 41.1 11/19/2020   MCV 92 11/19/2020   PLT 476 (H) 11/19/2020    Attestation Statements:   Reviewed by clinician on day of visit: allergies, medications, problem list, medical history, surgical history, family history, social history, and previous encounter notes.  Time spent on visit including pre-visit chart review and post-visit care and charting was 23 minutes.   Coral Ceo, CMA, am acting as transcriptionist for Coralie Common, MD.  I have reviewed the above documentation for accuracy and completeness, and I agree with the above. - Coralie Common, MD

## 2021-05-28 ENCOUNTER — Other Ambulatory Visit: Payer: Self-pay

## 2021-05-28 ENCOUNTER — Encounter (INDEPENDENT_AMBULATORY_CARE_PROVIDER_SITE_OTHER): Payer: Self-pay | Admitting: Family Medicine

## 2021-05-28 ENCOUNTER — Ambulatory Visit (INDEPENDENT_AMBULATORY_CARE_PROVIDER_SITE_OTHER): Payer: Medicaid Other | Admitting: Family Medicine

## 2021-05-28 VITALS — BP 130/83 | HR 92 | Temp 98.6°F | Ht 60.0 in | Wt 257.0 lb

## 2021-05-28 DIAGNOSIS — E8881 Metabolic syndrome: Secondary | ICD-10-CM

## 2021-05-28 DIAGNOSIS — E559 Vitamin D deficiency, unspecified: Secondary | ICD-10-CM | POA: Diagnosis not present

## 2021-05-28 DIAGNOSIS — Z6841 Body Mass Index (BMI) 40.0 and over, adult: Secondary | ICD-10-CM | POA: Diagnosis not present

## 2021-05-28 MED ORDER — BD PEN NEEDLE NANO 2ND GEN 32G X 4 MM MISC: 1.0000 | Freq: Two times a day (BID) | 0 refills | Status: DC

## 2021-05-28 MED ORDER — VICTOZA 18 MG/3ML ~~LOC~~ SOPN: 1.8000 mg | PEN_INJECTOR | Freq: Every day | SUBCUTANEOUS | 0 refills | Status: DC

## 2021-05-28 NOTE — Progress Notes (Signed)
Chief Complaint:   OBESITY Kendra [name] is here to discuss her progress with her obesity treatment plan along with follow-up of her obesity related diagnoses. Kendra [name] is on the Category 4 Plan and states she is following her eating plan approximately ?% of the time. Kendra [name] states she is not currently exercising.  Today's visit was #: 7 Starting weight: 258 lbs Starting date: 01/13/2021 Today's weight: 257 lbs Today's date: 05/28/2021 Total lbs lost to date: 1 Total lbs lost since last in-office visit: 1  Interim History: Kendra [name] has had a decent few weeks. She has to go to a funeral this weekend and also had recently discovered she was fired. Over the next few weeks, she may be interested in pursuing weight loss surgery. Pt wants to start cooking and be mindful of protein and calories.  Subjective:   1. Insulin resistance Kendra [name]'s last A1c was 5.5 and insulin level 6.8. She reports some polyphagia.  2. Vitamin D deficiency Pt denies nausea, vomiting, and muscle weakness but notes fatigue. Pt is on prescription Vit D.  Assessment/Plan:   1. Insulin resistance Kendra [name] will start Victoza 0.6 mg and continue to work on weight loss, exercise, and decreasing simple carbohydrates to help decrease the risk of diabetes. Kendra [name] agreed to follow-up with [place] as directed to closely monitor her progress.  Start- liraglutide (VICTOZA) 18 MG/3ML SOPN; Inject 1.8 mg into the skin daily.  Dispense: 9 mL; Refill: 0 - Insulin Pen Needle (BD PEN NEEDLE NANO 2ND GEN) 32G X 4 MM MISC; 1 Package by Does not apply route 2 (two) times daily.  Dispense: 100 each; Refill: 0  2. Vitamin D deficiency Low Vitamin D level contributes to fatigue and are associated with obesity, breast, and colon cancer. She agrees to continue to take prescription Vitamin D 50,000 IU every week and will follow-up for routine testing of Vitamin D, at least 2-3 times per year to avoid over-replacement.  3. Obesity with current BMI of 50.3  Kendra [name] is  currently in the action stage of change. As such, her goal is to continue with weight loss efforts. She has agreed to practicing portion control and making smarter food choices, such as increasing vegetables and decreasing simple carbohydrates.   Exercise goals: No exercise has been prescribed at this time.  Behavioral modification strategies: increasing lean protein intake, meal planning and cooking strategies, keeping healthy foods in the home, and planning for success.  Kendra [name] has agreed to follow-up with our clinic in 2-3 weeks. She was informed of the importance of frequent follow-up visits to maximize her success with intensive lifestyle modifications for her multiple health conditions.   Objective:   Blood pressure 130/83, pulse 92, temperature 98.6 F (37 C), height 5' (1.524 m), weight 257 lb (116.6 kg), last menstrual period 05/21/2021, SpO2 97 %. Body mass index is 50.19 kg/m.  General: Cooperative, alert, well developed, in no acute distress. HEENT: Conjunctivae and lids unremarkable. Cardiovascular: Regular rhythm.  Lungs: Normal work of breathing. Neurologic: No focal deficits.   Lab Results  Component Value Date   CREATININE 0.81 11/19/2020   BUN 10 11/19/2020   NA 138 11/19/2020   K 4.5 11/19/2020   CL 100 11/19/2020   CO2 22 11/19/2020   Lab Results  Component Value Date   ALT 14 11/19/2020   AST 12 11/19/2020   ALKPHOS 101 11/19/2020   BILITOT 0.3 11/19/2020   Lab Results  Component Value Date   HGBA1C 5.6 01/13/2021   Lab Results  Component Value Date   INSULIN 6.8 01/13/2021   Lab Results  Component Value Date   TSH 2.640 01/13/2021   Lab Results  Component Value Date   CHOL 262 (H) 01/13/2021   HDL 52 01/13/2021   LDLCALC 173 (H) 01/13/2021   TRIG 198 (H) 01/13/2021   Lab Results  Component Value Date   VD25OH 16.3 (L) 01/13/2021   Lab Results  Component Value Date   WBC 8.1 11/19/2020   HGB 13.7 11/19/2020   HCT 41.1 11/19/2020    MCV 92 11/19/2020   PLT 476 (H) 11/19/2020    Attestation Statements:   Reviewed by clinician on day of visit: allergies, medications, problem list, medical history, surgical history, family history, social history, and previous encounter notes.  Time spent on visit including pre-visit chart review and post-visit care and charting was 25 minutes.   Coral Ceo, CMA, am acting as transcriptionist for Coralie Common, MD.  I have reviewed the above documentation for accuracy and completeness, and I agree with the above. - Coralie Common, MD

## 2021-06-01 ENCOUNTER — Encounter (INDEPENDENT_AMBULATORY_CARE_PROVIDER_SITE_OTHER): Payer: Self-pay

## 2021-06-18 ENCOUNTER — Encounter: Payer: Self-pay | Admitting: Adult Health

## 2021-06-18 ENCOUNTER — Ambulatory Visit: Payer: Medicaid Other | Admitting: Adult Health

## 2021-06-22 ENCOUNTER — Ambulatory Visit (INDEPENDENT_AMBULATORY_CARE_PROVIDER_SITE_OTHER): Payer: Medicaid Other | Admitting: Family Medicine

## 2021-07-02 ENCOUNTER — Encounter (INDEPENDENT_AMBULATORY_CARE_PROVIDER_SITE_OTHER): Payer: Self-pay | Admitting: Family Medicine

## 2021-07-02 ENCOUNTER — Other Ambulatory Visit: Payer: Self-pay

## 2021-07-02 ENCOUNTER — Ambulatory Visit (INDEPENDENT_AMBULATORY_CARE_PROVIDER_SITE_OTHER): Payer: Medicaid Other | Admitting: Family Medicine

## 2021-07-02 VITALS — BP 131/86 | HR 97 | Temp 98.5°F | Ht 60.0 in | Wt 254.0 lb

## 2021-07-02 DIAGNOSIS — K219 Gastro-esophageal reflux disease without esophagitis: Secondary | ICD-10-CM | POA: Diagnosis not present

## 2021-07-02 DIAGNOSIS — E559 Vitamin D deficiency, unspecified: Secondary | ICD-10-CM | POA: Diagnosis not present

## 2021-07-02 DIAGNOSIS — Z6841 Body Mass Index (BMI) 40.0 and over, adult: Secondary | ICD-10-CM

## 2021-07-02 MED ORDER — VITAMIN D (ERGOCALCIFEROL) 1.25 MG (50000 UNIT) PO CAPS: 50000.0000 [IU] | ORAL_CAPSULE | ORAL | 0 refills | Status: DC

## 2021-07-02 NOTE — Progress Notes (Signed)
Chief Complaint:   OBESITY Kendra [name] is here to discuss her progress with her obesity treatment plan along with follow-up of her obesity related diagnoses. Kendra [name] is on practicing portion control and making smarter food choices, such as increasing vegetables and decreasing simple carbohydrates and states she is following her eating plan approximately 65% of the time. Kendra [name] states she is walking 60 minutes 2 times per week.  Today's visit was #: 8 Starting weight: 258 lbs Starting date: 01/13/2021 Today's weight: 254 lbs Today's date: 07/02/2021 Total lbs lost to date: 4 Total lbs lost since last in-office visit: 3  Interim History: Kendra [name] is currently in flux with employment. She hasn't had much of an appetite recently due to URI. She is eating quite a bit of salmon and chicken. Pt has had time to cook this week. She want to increase her walking.  Subjective:   1. Vitamin D deficiency Pt denies nausea, vomiting, and muscle weakness but notes fatigue. She is on prescription Vit D.  2. Gastroesophageal reflux disease, unspecified whether esophagitis present Kendra [name]'s symptoms are well controlled on Protonix.  Assessment/Plan:   1. Vitamin D deficiency Low Vitamin D level contributes to fatigue and are associated with obesity, breast, and colon cancer. She agrees to continue to take prescription Vitamin D 50,000 IU every week and will follow-up for routine testing of Vitamin D, at least 2-3 times per year to avoid over-replacement.  Refill- Vitamin D, Ergocalciferol, (DRISDOL) 1.25 MG (50000 UNIT) CAPS capsule; Take 1 capsule (50,000 Units total) by mouth every 7 (seven) days.  Dispense: 8 capsule; Refill: 0  2. Gastroesophageal reflux disease, unspecified whether esophagitis present Kendra [name] will continue Protonix as prescribed. Intensive lifestyle modifications are the first line treatment for this issue. We discussed several lifestyle modifications today and she will continue to work on diet,  exercise and weight loss efforts. Orders and follow up as documented in patient record.   Counseling If a person has gastroesophageal reflux disease (GERD), food and stomach acid move back up into the esophagus and cause symptoms or problems such as damage to the esophagus. Anti-reflux measures include: raising the head of the bed, avoiding tight clothing or belts, avoiding eating late at night, not lying down shortly after mealtime, and achieving weight loss. Avoid ASA, NSAID's, caffeine, alcohol, and tobacco.  OTC Pepcid and/or Tums are often very helpful for as needed use.  However, for persisting chronic or daily symptoms, stronger medications like Omeprazole may be needed. You may need to avoid foods and drinks such as: Coffee and tea (with or without caffeine). Drinks that contain alcohol. Energy drinks and sports drinks. Bubbly (carbonated) drinks or sodas. Chocolate and cocoa. Peppermint and mint flavorings. Garlic and onions. Horseradish. Spicy and acidic foods. These include peppers, chili powder, curry powder, vinegar, hot sauces, and BBQ sauce. Citrus fruit juices and citrus fruits, such as oranges, lemons, and limes. Tomato-based foods. These include red sauce, chili, salsa, and pizza with red sauce. Fried and fatty foods. These include donuts, french fries, potato chips, and high-fat dressings. High-fat meats. These include hot dogs, rib eye steak, sausage, ham, and bacon.  3. Obesity with current BMI of 49.6  Kendra [name] is currently in the action stage of change. As such, her goal is to continue with weight loss efforts. She has agreed to keeping a food journal and adhering to recommended goals of 1600-1700 calories and 125+ grams protein.   Exercise goals: All adults should avoid inactivity. Some physical activity is better  than none, and adults who participate in any amount of physical activity gain some health benefits.  Behavioral modification strategies: increasing lean  protein intake, meal planning and cooking strategies, and keeping healthy foods in the home.  Kendra [name] has agreed to follow-up with our clinic in 3-4 weeks. She was informed of the importance of frequent follow-up visits to maximize her success with intensive lifestyle modifications for her multiple health conditions.   Objective:   Blood pressure 131/86, pulse 97, temperature 98.5 F (36.9 C), height 5' (1.524 m), weight 254 lb (115.2 kg), last menstrual period 06/16/2021, SpO2 98 %. Body mass index is 49.61 kg/m.  General: Cooperative, alert, well developed, in no acute distress. HEENT: Conjunctivae and lids unremarkable. Cardiovascular: Regular rhythm.  Lungs: Normal work of breathing. Neurologic: No focal deficits.   Lab Results  Component Value Date   CREATININE 0.81 11/19/2020   BUN 10 11/19/2020   NA 138 11/19/2020   K 4.5 11/19/2020   CL 100 11/19/2020   CO2 22 11/19/2020   Lab Results  Component Value Date   ALT 14 11/19/2020   AST 12 11/19/2020   ALKPHOS 101 11/19/2020   BILITOT 0.3 11/19/2020   Lab Results  Component Value Date   HGBA1C 5.6 01/13/2021   Lab Results  Component Value Date   INSULIN 6.8 01/13/2021   Lab Results  Component Value Date   TSH 2.640 01/13/2021   Lab Results  Component Value Date   CHOL 262 (H) 01/13/2021   HDL 52 01/13/2021   LDLCALC 173 (H) 01/13/2021   TRIG 198 (H) 01/13/2021   Lab Results  Component Value Date   VD25OH 16.3 (L) 01/13/2021   Lab Results  Component Value Date   WBC 8.1 11/19/2020   HGB 13.7 11/19/2020   HCT 41.1 11/19/2020   MCV 92 11/19/2020   PLT 476 (H) 11/19/2020    Attestation Statements:   Reviewed by clinician on day of visit: allergies, medications, problem list, medical history, surgical history, family history, social history, and previous encounter notes.  Coral Ceo, CMA, am acting as transcriptionist for Coralie Common, MD.  I have reviewed the above documentation for  accuracy and completeness, and I agree with the above. - Coralie Common, MD

## 2021-07-27 ENCOUNTER — Ambulatory Visit (INDEPENDENT_AMBULATORY_CARE_PROVIDER_SITE_OTHER): Payer: Medicaid Other | Admitting: Family Medicine

## 2021-07-27 ENCOUNTER — Other Ambulatory Visit: Payer: Self-pay

## 2021-07-27 ENCOUNTER — Encounter (INDEPENDENT_AMBULATORY_CARE_PROVIDER_SITE_OTHER): Payer: Self-pay | Admitting: Family Medicine

## 2021-07-27 VITALS — BP 121/85 | HR 86 | Temp 98.4°F | Ht 60.0 in | Wt 257.0 lb

## 2021-07-27 DIAGNOSIS — Z6841 Body Mass Index (BMI) 40.0 and over, adult: Secondary | ICD-10-CM

## 2021-07-27 DIAGNOSIS — E7849 Other hyperlipidemia: Secondary | ICD-10-CM | POA: Diagnosis not present

## 2021-07-27 DIAGNOSIS — E8881 Metabolic syndrome: Secondary | ICD-10-CM | POA: Diagnosis not present

## 2021-07-27 DIAGNOSIS — E559 Vitamin D deficiency, unspecified: Secondary | ICD-10-CM | POA: Diagnosis not present

## 2021-07-27 NOTE — Progress Notes (Signed)
Chief Complaint:   OBESITY Kendra [name] is here to discuss her progress with her obesity treatment plan along with follow-up of her obesity related diagnoses. Kendra [name] is on keeping a food journal and adhering to recommended goals of 1600-1700 calories and 125+ grams protein and states she is following her eating plan approximately 75% of the time. Kendra [name] states she is not currently exercising.  Today's visit was #: 9 Starting weight: 258 lbs Starting date: 01/13/2021 Today's weight: 257 lbs Today's date: 07/27/2021 Total lbs lost to date: 1 Total lbs lost since last in-office visit: 0  Interim History: Kendra [name] is feeling very stressed with adherence to meal plan as all other responsibilities of life. She is struggling with motivation to meal plan. She hasn't necessarily journaled on any app. Pt may want to take some time away from the clinic.  Subjective:   1. Insulin resistance Kendra [name]'s A1c is 5.6 and insulin level 6.8. She could not get Rx for Victoza.  2. Vitamin D deficiency Pt is on prescription Vit D. Her last Vit D level was 16.3.  3. Other hyperlipidemia Her last LDL was 173, HDL 52, and triglycerides 198. She is not on statin.  Assessment/Plan:   1. Insulin resistance Kendra [name] will continue to work on weight loss, exercise, and decreasing simple carbohydrates to help decrease the risk of diabetes. Kendra [name] agreed to follow-up with [place] as directed to closely monitor her progress. Check labs today.  - Hemoglobin A1c - Insulin, random  2. Vitamin D deficiency Low Vitamin D level contributes to fatigue and are associated with obesity, breast, and colon cancer. She agrees to continue to take prescription Vitamin D 50,000 IU every week and will follow-up for routine testing of Vitamin D, at least 2-3 times per year to avoid over-replacement. Check labs today.  Refill Vit D 50,000 IU weekly, Disp #4, 0 RF   - VITAMIN D 25 Hydroxy (Vit-D Deficiency, Fractures)  3. Other  hyperlipidemia Cardiovascular risk and specific lipid/LDL goals reviewed.  We discussed several lifestyle modifications today and Kendra [name] will continue to work on diet, exercise and weight loss efforts. Orders and follow up as documented in patient record.   Counseling Intensive lifestyle modifications are the first line treatment for this issue. Dietary changes: Increase soluble fiber. Decrease simple carbohydrates. Exercise changes: Moderate to vigorous-intensity aerobic activity 150 minutes per week if tolerated. Lipid-lowering medications: see documented in medical record. Check labs today.  - Lipid Panel With LDL/HDL Ratio  4. Class 3 severe obesity with serious comorbidity and body mass index (BMI) of 50.0 to 59.9 in adult, unspecified obesity type (Kendra [name])  Kendra [name] is currently in the action stage of change. As such, her goal is to continue with weight loss efforts. She has agreed to practicing portion control and making smarter food choices, such as increasing vegetables and decreasing simple carbohydrates.   Exercise goals: No exercise has been prescribed at this time.  Behavioral modification strategies: increasing lean protein intake, no skipping meals, meal planning and cooking strategies, keeping healthy foods in the home, and holiday eating strategies .  Kendra [name] has agreed to follow-up with our clinic PRN. She was informed of the importance of frequent follow-up visits to maximize her success with intensive lifestyle modifications for her multiple health conditions.   Kendra [name] was informed we would discuss her lab results at her next visit unless there is a critical issue that needs to be addressed sooner. Kendra [name] agreed to keep her next visit at the agreed upon time  to discuss these results.  Objective:   Blood pressure 121/85, pulse 86, temperature 98.4 F (36.9 C), height 5' (1.524 m), weight 257 lb (116.6 kg), last menstrual period 06/30/2021, SpO2 97 %. Body mass index is 50.19  kg/m.  General: Cooperative, alert, well developed, in no acute distress. HEENT: Conjunctivae and lids unremarkable. Cardiovascular: Regular rhythm.  Lungs: Normal work of breathing. Neurologic: No focal deficits.   Lab Results  Component Value Date   CREATININE 0.81 11/19/2020   BUN 10 11/19/2020   NA 138 11/19/2020   K 4.5 11/19/2020   CL 100 11/19/2020   CO2 22 11/19/2020   Lab Results  Component Value Date   ALT 14 11/19/2020   AST 12 11/19/2020   ALKPHOS 101 11/19/2020   BILITOT 0.3 11/19/2020   Lab Results  Component Value Date   HGBA1C 5.6 01/13/2021   Lab Results  Component Value Date   INSULIN 6.8 01/13/2021   Lab Results  Component Value Date   TSH 2.640 01/13/2021   Lab Results  Component Value Date   CHOL 262 (H) 01/13/2021   HDL 52 01/13/2021   LDLCALC 173 (H) 01/13/2021   TRIG 198 (H) 01/13/2021   Lab Results  Component Value Date   VD25OH 16.3 (L) 01/13/2021   Lab Results  Component Value Date   WBC 8.1 11/19/2020   HGB 13.7 11/19/2020   HCT 41.1 11/19/2020   MCV 92 11/19/2020   PLT 476 (H) 11/19/2020    Attestation Statements:   Reviewed by clinician on day of visit: allergies, medications, problem list, medical history, surgical history, family history, social history, and previous encounter notes.  Coral Ceo, CMA, am acting as transcriptionist for Coralie Common, MD.  I have reviewed the above documentation for accuracy and completeness, and I agree with the above. - Coralie Common, MD

## 2021-07-28 LAB — LIPID PANEL WITH LDL/HDL RATIO
Cholesterol, Total: 237 mg/dL — ABNORMAL HIGH (ref 100–199)
HDL: 56 mg/dL (ref >39)
LDL Chol Calc (NIH): 157 mg/dL — ABNORMAL HIGH (ref 0–99)
LDL/HDL Ratio: 2.8 ratio (ref 0.0–3.2)
Triglycerides: 135 mg/dL (ref 0–149)
VLDL Cholesterol Cal: 24 mg/dL (ref 5–40)

## 2021-07-28 LAB — HEMOGLOBIN A1C
Est. average glucose Bld gHb Est-mCnc: 123 mg/dL
Hgb A1c MFr Bld: 5.9 % — ABNORMAL HIGH (ref 4.8–5.6)

## 2021-07-28 LAB — INSULIN, RANDOM: INSULIN: 17.2 u[IU]/mL (ref 2.6–24.9)

## 2021-07-28 LAB — VITAMIN D 25 HYDROXY (VIT D DEFICIENCY, FRACTURES): Vit D, 25-Hydroxy: 26.4 ng/mL — ABNORMAL LOW (ref 30.0–100.0)

## 2021-07-28 MED ORDER — VITAMIN D (ERGOCALCIFEROL) 1.25 MG (50000 UNIT) PO CAPS: 50000.0000 [IU] | ORAL_CAPSULE | ORAL | 0 refills | Status: DC

## 2021-08-04 ENCOUNTER — Other Ambulatory Visit: Payer: Self-pay | Admitting: Adult Health

## 2021-09-01 ENCOUNTER — Encounter (INDEPENDENT_AMBULATORY_CARE_PROVIDER_SITE_OTHER): Payer: Self-pay | Admitting: Family Medicine

## 2021-09-01 ENCOUNTER — Other Ambulatory Visit: Payer: Self-pay

## 2021-09-01 ENCOUNTER — Ambulatory Visit (INDEPENDENT_AMBULATORY_CARE_PROVIDER_SITE_OTHER): Payer: Medicaid Other | Admitting: Family Medicine

## 2021-09-01 VITALS — BP 138/81 | HR 89 | Temp 98.3°F | Ht 60.0 in | Wt 255.0 lb

## 2021-09-01 DIAGNOSIS — E559 Vitamin D deficiency, unspecified: Secondary | ICD-10-CM

## 2021-09-01 DIAGNOSIS — R739 Hyperglycemia, unspecified: Secondary | ICD-10-CM | POA: Diagnosis not present

## 2021-09-01 DIAGNOSIS — Z6841 Body Mass Index (BMI) 40.0 and over, adult: Secondary | ICD-10-CM | POA: Diagnosis not present

## 2021-09-01 MED ORDER — VITAMIN D (ERGOCALCIFEROL) 1.25 MG (50000 UNIT) PO CAPS: 50000.0000 [IU] | ORAL_CAPSULE | ORAL | 0 refills | Status: DC

## 2021-09-01 NOTE — Progress Notes (Signed)
Chief Complaint:   OBESITY Kendra [name] is here to discuss her progress with her obesity treatment plan along with follow-up of her obesity related diagnoses. Kendra [name] is on practicing portion control and making smarter food choices, such as increasing vegetables and decreasing simple carbohydrates and states she is following her eating plan approximately 30% of the time. Kendra [name] states she is not currently exercising.  Today's visit was #: 10 Starting weight: 258 lbs Starting date: 01/13/2021 Today's weight: 255 lbs Today's date: 09/01/2021 Total lbs lost to date: 3 Total lbs lost since last in-office visit: 2  Interim History: Kendra [name] had an enjoyable holiday season. She has noticed a change in eating habits with changing to night shift. She wants to know how she needs to eat during work to get food in. She knows she eats at the end of her shift. Pt is interested in not having to measure or weigh food.  Subjective:   1. Vitamin D deficiency Kendra [name] is on Rx Vit D and her last Vit D level was 26.4.  2. Hyperglycemia Pt has a history of elevated BS and is not on meds.  Assessment/Plan:   1. Vitamin D deficiency Low Vitamin D level contributes to fatigue and are associated with obesity, breast, and colon cancer. She agrees to continue to take prescription Vitamin D 50,000 IU every week and will follow-up for routine testing of Vitamin D, at least 2-3 times per year to avoid over-replacement.  Refill- Vitamin D, Ergocalciferol, (DRISDOL) 1.25 MG (50000 UNIT) CAPS capsule; Take 1 capsule (50,000 Units total) by mouth every 7 (seven) days.  Dispense: 8 capsule; Refill: 0  2. Hyperglycemia Follow up at next appt, as pt is trying low carb plan.  3. Obesity with current BMI of 49.9  Kendra [name] is currently in the action stage of change. As such, her goal is to continue with weight loss efforts. She has agreed to following a lower carbohydrate, vegetable and lean protein rich diet plan.   Exercise goals: All  adults should avoid inactivity. Some physical activity is better than none, and adults who participate in any amount of physical activity gain some health benefits.  Behavioral modification strategies: increasing lean protein intake, no skipping meals, meal planning and cooking strategies, and keeping healthy foods in the home.  Kendra [name] has agreed to follow-up with our clinic in 3 weeks. She was informed of the importance of frequent follow-up visits to maximize her success with intensive lifestyle modifications for her multiple health conditions.   Objective:   Blood pressure 138/81, pulse 89, temperature 98.3 F (36.8 C), height 5' (1.524 m), weight 255 lb (115.7 kg), SpO2 98 %. Body mass index is 49.8 kg/m.  General: Cooperative, alert, well developed, in no acute distress. HEENT: Conjunctivae and lids unremarkable. Cardiovascular: Regular rhythm.  Lungs: Normal work of breathing. Neurologic: No focal deficits.   Lab Results  Component Value Date   CREATININE 0.81 11/19/2020   BUN 10 11/19/2020   NA 138 11/19/2020   K 4.5 11/19/2020   CL 100 11/19/2020   CO2 22 11/19/2020   Lab Results  Component Value Date   ALT 14 11/19/2020   AST 12 11/19/2020   ALKPHOS 101 11/19/2020   BILITOT 0.3 11/19/2020   Lab Results  Component Value Date   HGBA1C 5.9 (H) 07/27/2021   HGBA1C 5.6 01/13/2021   Lab Results  Component Value Date   INSULIN 17.2 07/27/2021   INSULIN 6.8 01/13/2021   Lab Results  Component Value  Date   TSH 2.640 01/13/2021   Lab Results  Component Value Date   CHOL 237 (H) 07/27/2021   HDL 56 07/27/2021   LDLCALC 157 (H) 07/27/2021   TRIG 135 07/27/2021   Lab Results  Component Value Date   VD25OH 26.4 (L) 07/27/2021   VD25OH 16.3 (L) 01/13/2021   Lab Results  Component Value Date   WBC 8.1 11/19/2020   HGB 13.7 11/19/2020   HCT 41.1 11/19/2020   MCV 92 11/19/2020   PLT 476 (H) 11/19/2020    Attestation Statements:   Reviewed by clinician on  day of visit: allergies, medications, problem list, medical history, surgical history, family history, social history, and previous encounter notes.  Coral Ceo, CMA, am acting as transcriptionist for Coralie Common, MD.   I have reviewed the above documentation for accuracy and completeness, and I agree with the above. - Coralie Common, MD

## 2021-09-22 ENCOUNTER — Ambulatory Visit (INDEPENDENT_AMBULATORY_CARE_PROVIDER_SITE_OTHER): Payer: Medicaid Other | Admitting: Family Medicine

## 2021-10-19 ENCOUNTER — Encounter (INDEPENDENT_AMBULATORY_CARE_PROVIDER_SITE_OTHER): Payer: Self-pay | Admitting: Family Medicine

## 2021-10-19 ENCOUNTER — Ambulatory Visit (INDEPENDENT_AMBULATORY_CARE_PROVIDER_SITE_OTHER): Payer: No Typology Code available for payment source | Admitting: Family Medicine

## 2021-10-19 ENCOUNTER — Other Ambulatory Visit: Payer: Self-pay

## 2021-10-19 VITALS — BP 118/85 | HR 86 | Temp 98.4°F | Ht 60.0 in | Wt 250.0 lb

## 2021-10-19 DIAGNOSIS — E669 Obesity, unspecified: Secondary | ICD-10-CM

## 2021-10-19 DIAGNOSIS — Z6841 Body Mass Index (BMI) 40.0 and over, adult: Secondary | ICD-10-CM

## 2021-10-19 DIAGNOSIS — R7303 Prediabetes: Secondary | ICD-10-CM

## 2021-10-19 DIAGNOSIS — E559 Vitamin D deficiency, unspecified: Secondary | ICD-10-CM

## 2021-10-19 MED ORDER — BD PEN NEEDLE NANO 2ND GEN 32G X 4 MM MISC: 1.0000 | Freq: Two times a day (BID) | 0 refills | Status: DC

## 2021-10-19 MED ORDER — VICTOZA 18 MG/3ML ~~LOC~~ SOPN: 1.8000 mg | PEN_INJECTOR | Freq: Every day | SUBCUTANEOUS | 0 refills | Status: DC

## 2021-10-19 MED ORDER — VITAMIN D (ERGOCALCIFEROL) 1.25 MG (50000 UNIT) PO CAPS: 50000.0000 [IU] | ORAL_CAPSULE | ORAL | 0 refills | Status: DC

## 2021-10-19 NOTE — Progress Notes (Signed)
Chief Complaint:   OBESITY Kendra [name] is here to discuss her progress with her obesity treatment plan along with follow-up of her obesity related diagnoses. Kendra [name] is on following a lower carbohydrate, vegetable and lean protein rich diet plan and states she is following her eating plan approximately 50% of the time. Kendra [name] states she is doing at least 5,000 steps 6 times per week.  Today's visit was #: 11 Starting weight: 258 lbs Starting date: 01/13/2021 Today's weight: 250 lbs Today's date: 10/19/2021 Total lbs lost to date: 8 Total lbs lost since last in-office visit: 5  Interim History: Pt has significant concerns about her A1c level (recent 5.9). She is drinking V8 energy drinks and Red Bull energy drinks. She didn't pick up her Victoza. Pt thinks she is getting in enough protein but not necessarily measuring or weighing or logging. She is working nights.  Subjective:   1. Vitamin D deficiency Kendra [name] denies nausea, vomiting, and muscle weakness but notes fatigue. She is on prescription Vit D. Pt realizes she has not been taking pills as prescribed.  2. Prediabetes Kendra [name]'s last A1c was 5.9 with an insulin level of 17.2. Pt is very concerned about A1c elevation from previous 5.6 level.  Assessment/Plan:   1. Vitamin D deficiency Low Vitamin D level contributes to fatigue and are associated with obesity, breast, and colon cancer. She agrees to continue to take prescription Vitamin D 50,000 IU every week and will follow-up for routine testing of Vitamin D, at least 2-3 times per year to avoid over-replacement.  Refill- Vitamin D, Ergocalciferol, (DRISDOL) 1.25 MG (50000 UNIT) CAPS capsule; Take 1 capsule (50,000 Units total) by mouth every 7 (seven) days.  Dispense: 4 capsule; Refill: 0  2. Prediabetes Kendra [name] will continue to work on weight loss, exercise, and decreasing simple carbohydrates to help decrease the risk of diabetes.   Refill- Insulin Pen Needle (BD PEN NEEDLE NANO 2ND GEN) 32G  X 4 MM MISC; 1 Package by Does not apply route 2 (two) times daily.  Dispense: 100 each; Refill: 0 Refill- liraglutide (VICTOZA) 18 MG/3ML SOPN; Inject 1.8 mg into the skin daily.  Dispense: 9 mL; Refill: 0  3. Obesity with current BMI of 48.9 Kendra [name] is currently in the action stage of change. As such, her goal is to continue with weight loss efforts. She has agreed to following a lower carbohydrate, vegetable and lean protein rich diet plan.   Exercise goals: All adults should avoid inactivity. Some physical activity is better than none, and adults who participate in any amount of physical activity gain some health benefits.  Behavioral modification strategies: increasing lean protein intake, meal planning and cooking strategies, keeping healthy foods in the home, and planning for success.  Kendra [name] has agreed to follow-up with our clinic in 3 weeks. She was informed of the importance of frequent follow-up visits to maximize her success with intensive lifestyle modifications for her multiple health conditions.   Objective:   Blood pressure 118/85, pulse 86, temperature 98.4 F (36.9 C), height 5' (1.524 m), weight 250 lb (113.4 kg), SpO2 98 %. Body mass index is 48.82 kg/m.  General: Cooperative, alert, well developed, in no acute distress. HEENT: Conjunctivae and lids unremarkable. Cardiovascular: Regular rhythm.  Lungs: Normal work of breathing. Neurologic: No focal deficits.   Lab Results  Component Value Date   CREATININE 0.81 11/19/2020   BUN 10 11/19/2020   NA 138 11/19/2020   K 4.5 11/19/2020   CL 100 11/19/2020  CO2 22 11/19/2020   Lab Results  Component Value Date   ALT 14 11/19/2020   AST 12 11/19/2020   ALKPHOS 101 11/19/2020   BILITOT 0.3 11/19/2020   Lab Results  Component Value Date   HGBA1C 5.9 (H) 07/27/2021   HGBA1C 5.6 01/13/2021   Lab Results  Component Value Date   INSULIN 17.2 07/27/2021   INSULIN 6.8 01/13/2021   Lab Results  Component Value Date    TSH 2.640 01/13/2021   Lab Results  Component Value Date   CHOL 237 (H) 07/27/2021   HDL 56 07/27/2021   LDLCALC 157 (H) 07/27/2021   TRIG 135 07/27/2021   Lab Results  Component Value Date   VD25OH 26.4 (L) 07/27/2021   VD25OH 16.3 (L) 01/13/2021   Lab Results  Component Value Date   WBC 8.1 11/19/2020   HGB 13.7 11/19/2020   HCT 41.1 11/19/2020   MCV 92 11/19/2020   PLT 476 (H) 11/19/2020    Attestation Statements:   Reviewed by clinician on day of visit: allergies, medications, problem list, medical history, surgical history, family history, social history, and previous encounter notes.  Coral Ceo, CMA, am acting as transcriptionist for Coralie Common, MD.   I have reviewed the above documentation for accuracy and completeness, and I agree with the above. - Coralie Common, MD

## 2021-10-20 ENCOUNTER — Encounter (INDEPENDENT_AMBULATORY_CARE_PROVIDER_SITE_OTHER): Payer: Self-pay

## 2021-10-28 ENCOUNTER — Other Ambulatory Visit: Payer: Self-pay | Admitting: Gastroenterology

## 2021-10-28 DIAGNOSIS — R1011 Right upper quadrant pain: Secondary | ICD-10-CM

## 2021-11-06 ENCOUNTER — Ambulatory Visit: Payer: Medicaid Other

## 2021-11-10 ENCOUNTER — Ambulatory Visit
Admission: RE | Admit: 2021-11-10 | Discharge: 2021-11-10 | Disposition: A | Discharge status: Home / Self Care | Payer: No Typology Code available for payment source | Source: Ambulatory Visit | Attending: Gastroenterology | Admitting: Gastroenterology

## 2021-11-10 ENCOUNTER — Other Ambulatory Visit: Payer: Self-pay

## 2021-11-10 DIAGNOSIS — R1011 Right upper quadrant pain: Secondary | ICD-10-CM

## 2021-11-16 ENCOUNTER — Ambulatory Visit (INDEPENDENT_AMBULATORY_CARE_PROVIDER_SITE_OTHER): Payer: No Typology Code available for payment source | Admitting: Family Medicine

## 2021-11-17 ENCOUNTER — Encounter (INDEPENDENT_AMBULATORY_CARE_PROVIDER_SITE_OTHER): Payer: Self-pay | Admitting: Family Medicine

## 2021-11-17 ENCOUNTER — Other Ambulatory Visit: Payer: Self-pay

## 2021-11-17 ENCOUNTER — Ambulatory Visit (INDEPENDENT_AMBULATORY_CARE_PROVIDER_SITE_OTHER): Payer: No Typology Code available for payment source | Admitting: Family Medicine

## 2021-11-17 VITALS — BP 118/76 | HR 82 | Temp 98.3°F | Ht 60.0 in | Wt 250.0 lb

## 2021-11-17 DIAGNOSIS — Z6841 Body Mass Index (BMI) 40.0 and over, adult: Secondary | ICD-10-CM

## 2021-11-17 DIAGNOSIS — R7303 Prediabetes: Secondary | ICD-10-CM | POA: Diagnosis not present

## 2021-11-17 DIAGNOSIS — E669 Obesity, unspecified: Secondary | ICD-10-CM | POA: Diagnosis not present

## 2021-11-17 DIAGNOSIS — E559 Vitamin D deficiency, unspecified: Secondary | ICD-10-CM

## 2021-11-17 MED ORDER — VITAMIN D (ERGOCALCIFEROL) 1.25 MG (50000 UNIT) PO CAPS: 50000.0000 [IU] | ORAL_CAPSULE | ORAL | 0 refills | Status: DC

## 2021-11-18 NOTE — Progress Notes (Signed)
? ? ? ?Chief Complaint:  ? ?OBESITY ?Kendra [name] is here to discuss her progress with her obesity treatment plan along with follow-up of her obesity related diagnoses. [name] is on the Category 4 Plan and following a lower carbohydrate, vegetable and lean protein rich diet plan and states she is following her eating plan approximately 70% of the time. Kendra [name] states she is exercising for 30-60 minutes 7 times per week and walking 5 miles 7 times per week.  ? ?Today's visit was #: 12 ?Starting weight: 258 lbs ?Starting date: 01/13/2021 ?Today's weight: 250 lbs ?Today's date: 11/17/2021 ?Total lbs lost to date: 8 lbs ?Total lbs lost since last in-office visit: 0 ? 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 She gave up chocolate for [place]. Otherwise, she is eating more vegetables, soup etc. She has been mindful of portions as well. She has been walking more and exercising with nintendo switch. She had liver ultrasound showing interval increased in size of possible hemangioma.  ? ?Subjective:  ? ?1. Vitamin D deficiency ?Kendra [name] is on prescription Vitamin D currently. She denies nausea, vomiting, and muscle weakness. She notes fatigue.  ? ?2. Pre-diabetes ?Kendra [name]'s A1C was 5.9. Her insulin was 17.2. ? ?Assessment/Plan:  ? ?1. Vitamin D deficiency ?Low Vitamin D level contributes to fatigue and are associated with obesity, breast, and colon cancer. We will refill prescription Vitamin D 50,000 IU every week for 1 month with no refills and Kendra [name] will follow-up for routine testing of Vitamin D, at least 2-3 times per year to avoid over-replacement. ? ?- Vitamin D, Ergocalciferol, (DRISDOL) 1.25 MG (50000 UNIT) CAPS capsule; Take 1 capsule (50,000 Units total) by mouth every 7 (seven) days.  Dispense: 4 capsule; Refill: 0 ? ?2. Pre-diabetes ?Kendra [name] will apply for Ozempic patient assistance. She will continue to work on weight loss, exercise, and decreasing simple carbohydrates to help decrease the  risk of diabetes.  ? ?3. Obesity with current BMI of 48.9 ?Kendra [name] is currently in the action stage of change. As such, her goal is to continue with weight loss efforts. She has agreed to keeping a food journal and adhering to recommended goals of 1750-1800 calories and 135 plus grams of protein daily.  ? ?Exercise goals:  As is. ? ?Behavioral modification strategies: increasing lean protein intake. ? ?Kendra [name] has agreed to follow-up with our clinic in 3 weeks. She was informed of the importance of frequent follow-up visits to maximize her success with intensive lifestyle modifications for her multiple health conditions.  ? ?Objective:  ? ?Blood pressure 118/76, pulse 82, temperature 98.3 ?F (36.8 ?C), height 5' (1.524 m), weight 250 lb (113.4 kg), SpO2 98 %. ?Body mass index is 48.82 kg/m?. ? ?General: Cooperative, alert, well developed, in no acute distress. ?HEENT: Conjunctivae and lids unremarkable. ?Cardiovascular: Regular rhythm.  ?Lungs: Normal work of breathing. ?Neurologic: No focal deficits.  ? ?Lab Results  ?Component Value Date  ? CREATININE 0.81 11/19/2020  ? BUN 10 11/19/2020  ? NA 138 11/19/2020  ? K 4.5 11/19/2020  ? CL 100 11/19/2020  ? CO2 22 11/19/2020  ? ?Lab Results  ?Component Value Date  ? ALT 14 11/19/2020  ? AST 12 11/19/2020  ? ALKPHOS 101 11/19/2020  ? BILITOT 0.3 11/19/2020  ? ?Lab Results  ?Component Value Date  ? HGBA1C 5.9 (H) 07/27/2021  ? HGBA1C 5.6 01/13/2021  ? ?Lab Results  ?Component Value Date  ? INSULIN 17.2 07/27/2021  ? INSULIN 6.8 01/13/2021  ? ?  Lab Results  ?Component Value Date  ? TSH 2.640 01/13/2021  ? ?Lab Results  ?Component Value Date  ? CHOL 237 (H) 07/27/2021  ? HDL 56 07/27/2021  ? LDLCALC 157 (H) 07/27/2021  ? TRIG 135 07/27/2021  ? ?Lab Results  ?Component Value Date  ? VD25OH 26.4 (L) 07/27/2021  ? VD25OH 16.3 (L) 01/13/2021  ? ?Lab Results  ?Component Value Date  ? WBC 8.1 11/19/2020  ? HGB 13.7 11/19/2020  ? HCT 41.1 11/19/2020  ? MCV 92 11/19/2020  ? PLT 476 (H)  11/19/2020  ? ?No results found for: IRON, TIBC, FERRITIN ? ?Attestation Statements:  ? ?Reviewed by clinician on day of visit: allergies, medications, problem list, medical history, surgical history, family history, social history, and previous encounter notes. ? ?I, Lizbeth Bark, RMA, am acting as transcriptionist for Coralie Common, MD. ? ?I have reviewed the above documentation for accuracy and completeness, and I agree with the above. Coralie Common, MD ? ?

## 2021-12-14 ENCOUNTER — Encounter (INDEPENDENT_AMBULATORY_CARE_PROVIDER_SITE_OTHER): Payer: Self-pay | Admitting: Family Medicine

## 2021-12-14 ENCOUNTER — Ambulatory Visit (INDEPENDENT_AMBULATORY_CARE_PROVIDER_SITE_OTHER): Payer: No Typology Code available for payment source | Admitting: Family Medicine

## 2021-12-14 VITALS — BP 125/85 | HR 87 | Temp 98.6°F | Ht 60.0 in | Wt 251.0 lb

## 2021-12-14 DIAGNOSIS — E559 Vitamin D deficiency, unspecified: Secondary | ICD-10-CM | POA: Diagnosis not present

## 2021-12-14 DIAGNOSIS — Z9189 Other specified personal risk factors, not elsewhere classified: Secondary | ICD-10-CM

## 2021-12-14 DIAGNOSIS — R7303 Prediabetes: Secondary | ICD-10-CM

## 2021-12-14 DIAGNOSIS — E669 Obesity, unspecified: Secondary | ICD-10-CM | POA: Diagnosis not present

## 2021-12-14 DIAGNOSIS — Z6841 Body Mass Index (BMI) 40.0 and over, adult: Secondary | ICD-10-CM | POA: Diagnosis not present

## 2021-12-14 DIAGNOSIS — E66813 Obesity, class 3: Secondary | ICD-10-CM

## 2021-12-14 MED ORDER — VITAMIN D (ERGOCALCIFEROL) 1.25 MG (50000 UNIT) PO CAPS: 50000.0000 [IU] | ORAL_CAPSULE | ORAL | 0 refills | Status: AC

## 2021-12-25 NOTE — Progress Notes (Signed)
? ? ? ?Chief Complaint:  ? ?OBESITY ?Kendra [name] is here to discuss her progress with her obesity treatment plan along with follow-up of her obesity related diagnoses. Kendra [name] is on keeping a food journal and adhering to recommended goals of 1750 to 1800 calories and 135 grams of protein and states she is following her eating plan approximately 75% of the time. Kendra [name] states she is walking 1/2 mile for 5 times per week. ? ?Today's visit was #: 13 ?Starting weight: 258 lbs ?Starting date: 01/13/2021 ?Today's weight: 251 lbs ?Today's date: 12/14/2021 ?Total lbs lost to date: 7 ?Total lbs lost since last in-office visit: 0 ? ?Interim History: Kendra [name] [place] from the beach on vacation. She feels rested and that vacation was much needed. 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 She feels like she has made some improvements meal plan wise. Kendra [name] ate some chocolate chips once Kendra [name] was over. Kendra [name] thinks she fluctuates between being over and under calories and protein, but thinks that she is getting in enough. ? ?Subjective:  ? ?1. Vitamin D deficiency ?Marsia is on prescription vitamin D and her last vitamin D level was 26.4. She admits fatigue and denies nausea, vomiting, and muscle weakness. ? ?2. Prediabetes ?Haydyn's last A1c was 5.9 and Insulin was 17.2. She is not on medication due to insurance not covering Victoza. ? ?3. At risk for deficient intake of food ?Kendra [name] is at risk for deficient intake of food due to not eating enough. ? ?Assessment/Plan:  ? ?1. Vitamin D deficiency ?Traniya agrees to continue taking prescription vitamin D and and will follow up at the agreed upon time.. ? ?- Vitamin D, Ergocalciferol, (DRISDOL) 1.25 MG (50000 UNIT) CAPS capsule; Take 1 capsule (50,000 Units total) by mouth every 7 (seven) days.  Dispense: 4 capsule; Refill: 0 ? ?2. Prediabetes ?Kendra [name]'s agrees to discontinue Victoza and agreed to follow up as directed. ? ?3. At risk for deficient intake of  food ?Jahleah was given approximately 15 minutes of deficient intake of food prevention counseling today. [name] is at risk for eating too few calories based on current food recall. She was encouraged to focus on meeting caloric and protein goals according to her recommended meal plan.  ? ?4. Obesity with current BMI of 49.1 ? ?Kendra [name] is currently in the action stage of change. As such, her goal is to continue with weight loss efforts. She has agreed to keeping a food journal and adhering to recommended goals of 1750 to 1800 calories and 125+ grams of protein. Kendra [name] agrees to bring in her written food journal. ? ?Exercise goals: All adults should avoid inactivity. Some physical activity is better than none, and adults who participate in any amount of physical activity gain some health benefits. ? ?Behavioral modification strategies: increasing lean protein intake, meal planning and cooking strategies, keeping healthy foods in the home, celebration eating strategies, and keeping a strict food journal. ? ?Kendra [name] has agreed to follow-up with our clinic in 4 weeks. She was informed of the importance of frequent follow-up visits to maximize her success with intensive lifestyle modifications for her multiple health conditions.  ? ?Objective:  ? ?Blood pressure 125/85, pulse 87, temperature 98.6 ?F (37 ?C), height 5' (1.524 m), weight 251 lb (113.9 kg), last menstrual period 10/28/2021, SpO2 97 %. ?Body mass index is 49.02 kg/m?. ? ?General: Cooperative, alert, well developed, in no acute distress. ?HEENT: Conjunctivae and lids unremarkable. ?Cardiovascular: Regular rhythm.  ?Lungs:  Normal work of breathing. ?Neurologic: No focal deficits.  ? ?Lab Results  ?Component Value Date  ? CREATININE 0.81 11/19/2020  ? BUN 10 11/19/2020  ? NA 138 11/19/2020  ? K 4.5 11/19/2020  ? CL 100 11/19/2020  ? CO2 22 11/19/2020  ? ?Lab Results  ?Component Value Date  ? ALT 14 11/19/2020  ? AST 12 11/19/2020  ? ALKPHOS 101 11/19/2020  ? BILITOT 0.3  11/19/2020  ? ?Lab Results  ?Component Value Date  ? HGBA1C 5.9 (H) 07/27/2021  ? HGBA1C 5.6 01/13/2021  ? ?Lab Results  ?Component Value Date  ? INSULIN 17.2 07/27/2021  ? INSULIN 6.8 01/13/2021  ? ?Lab Results  ?Component Value Date  ? TSH 2.640 01/13/2021  ? ?Lab Results  ?Component Value Date  ? CHOL 237 (H) 07/27/2021  ? HDL 56 07/27/2021  ? LDLCALC 157 (H) 07/27/2021  ? TRIG 135 07/27/2021  ? ?Lab Results  ?Component Value Date  ? VD25OH 26.4 (L) 07/27/2021  ? VD25OH 16.3 (L) 01/13/2021  ? ?Lab Results  ?Component Value Date  ? WBC 8.1 11/19/2020  ? HGB 13.7 11/19/2020  ? HCT 41.1 11/19/2020  ? MCV 92 11/19/2020  ? PLT 476 (H) 11/19/2020  ? ?No results found for: IRON, TIBC, FERRITIN ? ?Attestation Statements:  ? ?Reviewed by clinician on day of visit: allergies, medications, problem list, medical history, surgical history, family history, social history, and previous encounter notes. ? ?I, Marcille Blanco, CMA, am acting as transcriptionist for Kendra Common MD ? ?I have reviewed the above documentation for accuracy and completeness, and I agree with the above. Kendra Common, MD ? ?

## 2022-01-04 ENCOUNTER — Other Ambulatory Visit: Payer: Self-pay | Admitting: Neurosurgery

## 2022-01-04 ENCOUNTER — Other Ambulatory Visit (HOSPITAL_COMMUNITY): Payer: Self-pay | Admitting: Neurosurgery

## 2022-01-04 DIAGNOSIS — D329 Benign neoplasm of meninges, unspecified: Secondary | ICD-10-CM

## 2022-01-11 ENCOUNTER — Encounter (INDEPENDENT_AMBULATORY_CARE_PROVIDER_SITE_OTHER): Payer: Self-pay | Admitting: Family Medicine

## 2022-01-11 ENCOUNTER — Ambulatory Visit (INDEPENDENT_AMBULATORY_CARE_PROVIDER_SITE_OTHER): Payer: No Typology Code available for payment source | Admitting: Family Medicine

## 2022-01-11 VITALS — BP 126/80 | HR 80 | Temp 98.3°F | Ht 60.0 in | Wt 251.0 lb

## 2022-01-11 DIAGNOSIS — E669 Obesity, unspecified: Secondary | ICD-10-CM

## 2022-01-11 DIAGNOSIS — R7303 Prediabetes: Secondary | ICD-10-CM

## 2022-01-11 DIAGNOSIS — Z6841 Body Mass Index (BMI) 40.0 and over, adult: Secondary | ICD-10-CM | POA: Diagnosis not present

## 2022-01-11 DIAGNOSIS — F3289 Other specified depressive episodes: Secondary | ICD-10-CM | POA: Diagnosis not present

## 2022-01-12 NOTE — Progress Notes (Signed)
Chief Complaint:   OBESITY Kendra [name] is here to discuss her progress with her obesity treatment plan along with follow-up of her obesity related diagnoses. Kendra [name] is on keeping a food journal and adhering to recommended goals of 1750-1800 calories and 25 plus grams of  protein and states she is following her eating plan approximately 20% of the time. Kendra [name] states she is counting steps 8,000  7 times per week.  Today's visit was #: 14 Starting weight: 258 lbs Starting date: 01/13/2021 Today's weight: 251 lbs Today's date: 01/11/2022 Total lbs lost to date: 7 Total lbs lost since last in-office visit: 0  Interim History: Kendra [name] has been working quite a bit over the last few weeks. She did journal but recognized she is eating food off her nutritional goals. She thinks it is sweets intake that is being a saboteur. She feels exhausted with working up to 16 hours per day and 6 days per week. She realizes top priority isn't weight loss. She did go to see [facility].   Subjective:   1. Prediabetes Kendra [name] last A1C was 5.8. Insuin was 6.8. She is not on medications. Her insurance did not cover any GLP-1. She recognizes she eats more sweets than she knows she should.   2. Other depression Kendra [name] was teary eyed throughout appointment. She denies suicidal ideations and homicidal ideations. She is currently on Kendra [name].   Assessment/Plan:   1. Prediabetes Kendra [name] will continue to work on weight loss, exercise, and decreasing simple carbohydrates to help decrease the risk of diabetes. She will follow up with [facility] for further management.   2. Other depression Kendra [name] will seek psychologist/therapy for help with life situations, food relationship. Behavior modification techniques were discussed today to help Kendra [name] deal with her emotional/non-hunger eating behaviors.  Orders and follow up as documented in patient record.   3. Obesity with current BMI of 49.1 Kendra [name] is currently in the action  stage of change. As such, her goal is to continue with weight loss efforts. She has agreed to keeping a food journal and adhering to recommended goals of 1750-1800 calories and 125 plus grams of protein daily.   Exercise goals: All adults should avoid inactivity. Some physical activity is better than none, and adults who participate in any amount of physical activity gain some health benefits.  Behavioral modification strategies: increasing lean protein intake, meal planning and cooking strategies, and keeping healthy foods in the home.  Kendra [name] has agreed to follow-up with our clinic in as needed. She was informed of the importance of frequent follow-up visits to maximize her success with intensive lifestyle modifications for her multiple health conditions.   Objective:   Blood pressure 126/80, pulse 80, temperature 98.3 F (36.8 C), height 5' (1.524 m), weight 251 lb (113.9 kg), SpO2 99 %. Body mass index is 49.02 kg/m.  General: Cooperative, alert, well developed, in no acute distress. HEENT: Conjunctivae and lids unremarkable. Cardiovascular: Regular rhythm.  Lungs: Normal work of breathing. Neurologic: No focal deficits.   Lab Results  Component Value Date   CREATININE 0.81 11/19/2020   BUN 10 11/19/2020   NA 138 11/19/2020   K 4.5 11/19/2020   CL 100 11/19/2020   CO2 22 11/19/2020   Lab Results  Component Value Date   ALT 14 11/19/2020   AST 12 11/19/2020   ALKPHOS 101 11/19/2020   BILITOT 0.3 11/19/2020   Lab Results  Component Value Date   HGBA1C 5.9 (H) 07/27/2021  HGBA1C 5.6 01/13/2021   Lab Results  Component Value Date   INSULIN 17.2 07/27/2021   INSULIN 6.8 01/13/2021   Lab Results  Component Value Date   TSH 2.640 01/13/2021   Lab Results  Component Value Date   CHOL 237 (H) 07/27/2021   HDL 56 07/27/2021   LDLCALC 157 (H) 07/27/2021   TRIG 135 07/27/2021   Lab Results  Component Value Date   VD25OH 26.4 (L) 07/27/2021   VD25OH 16.3 (L)  01/13/2021   Lab Results  Component Value Date   WBC 8.1 11/19/2020   HGB 13.7 11/19/2020   HCT 41.1 11/19/2020   MCV 92 11/19/2020   PLT 476 (H) 11/19/2020   No results found for: IRON, TIBC, FERRITIN  Attestation Statements:   Reviewed by clinician on day of visit: allergies, medications, problem list, medical history, surgical history, family history, social history, and previous encounter notes.  I, Lizbeth Bark, RMA, am acting as transcriptionist for Coralie Common, MD.   I have reviewed the above documentation for accuracy and completeness, and I agree with the above. - Coralie Common, MD

## 2022-02-10 ENCOUNTER — Other Ambulatory Visit: Payer: Self-pay | Admitting: Adult Health

## 2022-02-11 ENCOUNTER — Other Ambulatory Visit: Payer: Self-pay | Admitting: Adult Health

## 2022-02-16 NOTE — Telephone Encounter (Signed)
She would need a lamictal level

## 2022-03-04 ENCOUNTER — Other Ambulatory Visit: Payer: Self-pay | Admitting: *Deleted

## 2022-03-04 DIAGNOSIS — G43709 Chronic migraine without aura, not intractable, without status migrainosus: Secondary | ICD-10-CM

## 2022-03-04 DIAGNOSIS — R569 Unspecified convulsions: Secondary | ICD-10-CM

## 2022-03-04 DIAGNOSIS — Z5181 Encounter for therapeutic drug level monitoring: Secondary | ICD-10-CM

## 2022-03-04 NOTE — Telephone Encounter (Signed)
I called pt, since received notification that she did not get message about needing lab work while on lamictal.  I called pt and she will come in next week, Monday prior to her am dose of lamotrigine.  No appt needed, just come in for lab at 0800, [facility] will take care of you.  She appreciated call back.

## 2022-03-08 ENCOUNTER — Encounter: Payer: Self-pay | Admitting: *Deleted

## 2022-03-21 ENCOUNTER — Other Ambulatory Visit: Payer: Self-pay | Admitting: Adult Health

## 2022-03-23 NOTE — Telephone Encounter (Signed)
Called pt to f/u on the unread mychart message. LVM with office number asking pt to call us back.

## 2022-04-07 ENCOUNTER — Encounter (INDEPENDENT_AMBULATORY_CARE_PROVIDER_SITE_OTHER): Payer: Self-pay

## 2022-04-09 ENCOUNTER — Other Ambulatory Visit: Payer: Self-pay | Admitting: Adult Health

## 2022-04-19 ENCOUNTER — Other Ambulatory Visit (INDEPENDENT_AMBULATORY_CARE_PROVIDER_SITE_OTHER): Payer: Self-pay

## 2022-04-19 DIAGNOSIS — G43709 Chronic migraine without aura, not intractable, without status migrainosus: Secondary | ICD-10-CM

## 2022-04-19 DIAGNOSIS — R569 Unspecified convulsions: Secondary | ICD-10-CM

## 2022-04-19 DIAGNOSIS — Z0289 Encounter for other administrative examinations: Secondary | ICD-10-CM

## 2022-04-19 DIAGNOSIS — Z5181 Encounter for therapeutic drug level monitoring: Secondary | ICD-10-CM

## 2022-04-20 LAB — LAMOTRIGINE LEVEL: Lamotrigine Lvl: 2.3 ug/mL (ref 2.0–20.0)

## 2022-05-11 ENCOUNTER — Other Ambulatory Visit: Payer: Self-pay | Admitting: Adult Health

## 2022-05-11 ENCOUNTER — Encounter: Payer: Self-pay | Admitting: *Deleted

## 2022-07-28 ENCOUNTER — Encounter (HOSPITAL_COMMUNITY): Payer: Self-pay

## 2022-07-28 ENCOUNTER — Emergency Department (HOSPITAL_COMMUNITY)
Admission: EM | Admit: 2022-07-28 | Discharge: 2022-07-28 | Disposition: A | Discharge status: Home / Self Care | Payer: 59 | Attending: Emergency Medicine | Admitting: Emergency Medicine

## 2022-07-28 ENCOUNTER — Emergency Department (HOSPITAL_COMMUNITY): Payer: 59

## 2022-07-28 ENCOUNTER — Other Ambulatory Visit: Payer: Self-pay

## 2022-07-28 DIAGNOSIS — Z91010 Allergy to peanuts: Secondary | ICD-10-CM | POA: Diagnosis not present

## 2022-07-28 DIAGNOSIS — Z7951 Long term (current) use of inhaled steroids: Secondary | ICD-10-CM | POA: Diagnosis not present

## 2022-07-28 DIAGNOSIS — J45909 Unspecified asthma, uncomplicated: Secondary | ICD-10-CM | POA: Insufficient documentation

## 2022-07-28 DIAGNOSIS — R519 Headache, unspecified: Secondary | ICD-10-CM | POA: Diagnosis present

## 2022-07-28 DIAGNOSIS — D320 Benign neoplasm of cerebral meninges: Secondary | ICD-10-CM | POA: Insufficient documentation

## 2022-07-28 DIAGNOSIS — D329 Benign neoplasm of meninges, unspecified: Secondary | ICD-10-CM

## 2022-07-28 LAB — CBC WITH DIFFERENTIAL/PLATELET
Abs Immature Granulocytes: 0.02 10*3/uL (ref 0.00–0.07)
Basophils Absolute: 0 10*3/uL (ref 0.0–0.1)
Basophils Relative: 0 %
Eosinophils Absolute: 0.1 10*3/uL (ref 0.0–0.5)
Eosinophils Relative: 2 %
HCT: 43 % (ref 36.0–46.0)
Hemoglobin: 13.8 g/dL (ref 12.0–15.0)
Immature Granulocytes: 0 %
Lymphocytes Relative: 43 %
Lymphs Abs: 2.7 10*3/uL (ref 0.7–4.0)
MCH: 30.3 pg (ref 26.0–34.0)
MCHC: 32.1 g/dL (ref 30.0–36.0)
MCV: 94.3 fL (ref 80.0–100.0)
Monocytes Absolute: 0.4 10*3/uL (ref 0.1–1.0)
Monocytes Relative: 6 %
Neutro Abs: 3.1 10*3/uL (ref 1.7–7.7)
Neutrophils Relative %: 49 %
Platelets: 463 10*3/uL — ABNORMAL HIGH (ref 150–400)
RBC: 4.56 MIL/uL (ref 3.87–5.11)
RDW: 14 % (ref 11.5–15.5)
WBC: 6.3 10*3/uL (ref 4.0–10.5)
nRBC: 0 % (ref 0.0–0.2)

## 2022-07-28 LAB — I-STAT BETA HCG BLOOD, ED (MC, WL, AP ONLY): I-stat hCG, quantitative: <5 m[IU]/mL (ref <5)

## 2022-07-28 LAB — BASIC METABOLIC PANEL
Anion gap: 14 (ref 5–15)
BUN: 11 mg/dL (ref 6–20)
CO2: 24 mmol/L (ref 22–32)
Calcium: 9.2 mg/dL (ref 8.9–10.3)
Chloride: 100 mmol/L (ref 98–111)
Creatinine, Ser: 0.87 mg/dL (ref 0.44–1.00)
GFR, Estimated: >60 mL/min (ref >60)
Glucose, Bld: 104 mg/dL — ABNORMAL HIGH (ref 70–99)
Potassium: 3.8 mmol/L (ref 3.5–5.1)
Sodium: 138 mmol/L (ref 135–145)

## 2022-07-28 LAB — HCG, QUANTITATIVE, PREGNANCY: hCG, Beta Chain, Quant, S: <1 m[IU]/mL (ref <5)

## 2022-07-28 MED ORDER — PROCHLORPERAZINE EDISYLATE 10 MG/2ML IJ SOLN
10.0000 mg | Freq: Once | INTRAMUSCULAR | Status: AC
  Administered 2022-07-28: 10 mg via INTRAVENOUS
  Filled 2022-07-28: qty 2

## 2022-07-28 MED ORDER — DIPHENHYDRAMINE HCL 50 MG/ML IJ SOLN
12.5000 mg | Freq: Once | INTRAMUSCULAR | Status: AC
  Administered 2022-07-28: 12.5 mg via INTRAVENOUS
  Filled 2022-07-28: qty 1

## 2022-07-28 MED ORDER — GADOBUTROL 1 MMOL/ML IV SOLN
10.0000 mL | Freq: Once | INTRAVENOUS | Status: AC | PRN
  Administered 2022-07-28: 10 mL via INTRAVENOUS

## 2022-07-28 NOTE — ED Provider Notes (Signed)
[place] EMERGENCY DEPARTMENT Provider Note   CSN: [id number] Arrival date & time: 07/28/22  1230    History  Chief Complaint  Patient presents with   Headache    Kendra [name] is a 36 y.o. female hx of meningioma, s/p resection by Dr. Kathyrn Sheriff, frequent headaches, seizures, asthma who is here for evaluation of HA. Has had worsening HA. States she was seen by Optho a month ago and was told she had "something behind my left eye." She is unsure what she was told. No HX of MS, optic neuritis. Was post to follow-up for MRI brain post meningioma resection this summer with Dr. Kathyrn Sheriff however was uninsured and was not able to.  She recently establish care.  Current headache today has been 3 days over her left side of her head.  She denies any diplopia, blurred vision.  No midline neck pain, trauma or injuries.  Has felt tingling all over with her prior headaches however none currently.  No sudden onset thunderclap headache.  Followed by optho she need further imaging. No fever, visual field cuts, midline tenderness, no numbness or weakness.  HPI     Home Medications Prior to Admission medications   Medication Sig Start Date End Date Taking? Authorizing Provider  acetaminophen (TYLENOL) 500 MG tablet Take 1,000 mg by mouth every 6 (six) hours as needed (for pain/headaches.).    [provider]  albuterol (VENTOLIN HFA) 108 (90 Base) MCG/ACT inhaler Inhale 2 puffs into the lungs every 6 (six) hours as needed for wheezing or shortness of breath. 06/19/20   Caccavale, Sophia, PA-C  budesonide-formoterol (SYMBICORT) 160-4.5 MCG/ACT inhaler Inhale 2 puffs into the lungs 2 (two) times daily. 06/19/20   Caccavale, Sophia, PA-C  cetirizine (ZYRTEC) 10 MG tablet Take 10 mg by mouth daily.    [provider]  diphenhydrAMINE (BENADRYL) 25 mg capsule Take 1 capsule (25 mg total) by mouth every 6 (six) hours as needed. Patient taking differently: Take 25 mg by  mouth every 6 (six) hours as needed for allergies. 07/17/16   Mackuen, Courteney Lyn, MD  doxycycline (VIBRAMYCIN) 100 MG capsule Take 100 mg by mouth daily.    [provider]  EPINEPHrine 0.3 mg/0.3 mL IJ SOAJ injection Inject 0.3 mg into the skin once. 02/08/19   [provider]  fluticasone (FLONASE) 50 MCG/ACT nasal spray Place 1 spray into both nostrils daily. 06/19/20   Caccavale, Sophia, PA-C  gabapentin (NEURONTIN) 100 MG capsule TAKE 1 CAPSULE(100 MG) BY MOUTH AT BEDTIME 05/11/22   Ward Givens, NP  lamoTRIgine (LAMICTAL) 100 MG tablet TAKE 2 TABLETS(200 MG) BY MOUTH TWICE DAILY 04/12/22   Ward Givens, NP  LINZESS 290 MCG CAPS capsule Take 290 mcg by mouth daily as needed (constipation).  07/16/16   [provider]  pantoprazole (PROTONIX) 40 MG tablet Take 1 tablet (40 mg) twice daily for two weeks. Then take 1 tablet (40 mg) daily. 09/05/20   Sueanne Margarita, MD  Polyethylene Glycol 3350 (MIRALAX PO) Take by mouth.    [provider]  psyllium (METAMUCIL) 58.6 % powder Take 1 packet by mouth as needed.    [provider]  Tretinoin Microsphere (RETIN-A MICRO PUMP) 0.08 % GEL Apply 1 application topically at bedtime. Apply one application topically to face each night as needed.    [provider]  Vitamin D, Ergocalciferol, (DRISDOL) 1.25 MG (50000 UNIT) CAPS capsule Take 1 capsule (50,000 Units total) by mouth every 7 (seven) days. 12/14/21  Laqueta Linden, MD  zinc gluconate 50 MG tablet Take 50 mg by mouth daily.    [provider]      Allergies    Amoxicillin, Asa buff (mag [buffered aspirin], Erythromycin, Iodine, Peanut-containing drug products, Shellfish allergy, Doxycycline, Other, and Ibuprofen    Review of Systems   Review of Systems  Constitutional: Negative.   HENT: Negative.    Respiratory: Negative.    Cardiovascular: Negative.   Gastrointestinal: Negative.   Genitourinary: Negative.    Musculoskeletal: Negative.   Skin: Negative.   Neurological:  Positive for headaches. Negative for dizziness, tremors, seizures, syncope, facial asymmetry, speech difficulty, weakness, light-headedness and numbness.  All other systems reviewed and are negative.   Physical Exam Updated Vital Signs BP (!) 138/102 (BP Location: Right Arm)   Pulse 82   Temp 97.9 F (36.6 C) (Oral)   Resp 17   Ht 5' (1.524 m)   Wt 113.9 kg   SpO2 99%   BMI 49.02 kg/m  Physical Exam Physical Exam  Constitutional: Pt is oriented to person, place, and time. Pt appears well-developed and well-nourished. No distress.  HENT:  Head: Normocephalic and atraumatic.  Mouth/Throat: Oropharynx is clear and moist.  Eyes: Conjunctivae and EOM are normal. Pupils are equal, round, and reactive to light. No scleral icterus.  No horizontal, vertical or rotational nystagmus  Neck: Normal range of motion. Neck supple.  Full active and passive ROM without pain No midline or paraspinal tenderness No nuchal rigidity or meningeal signs  Cardiovascular: Normal rate, regular rhythm and intact distal pulses.   Pulmonary/Chest: Effort normal and breath sounds normal. No respiratory distress. Pt has no wheezes. No rales.  Abdominal: Soft. Bowel sounds are normal. There is no tenderness. There is no rebound and no guarding.  Musculoskeletal: Normal range of motion.  Lymphadenopathy:    No cervical adenopathy.  Neurological: Pt. is alert and oriented to person, place, and time. He has normal reflexes. No cranial nerve deficit.  Exhibits normal muscle tone. Coordination normal.  Mental Status:  Alert, oriented, thought content appropriate. Speech fluent without evidence of aphasia. Able to follow 2 step commands without difficulty.  Cranial Nerves: 2-12 grossly intact Motor:  Equal strength Sensory: Pinprick and light touch normal in all extremities.  Cerebellar: normal F2N with bilateral upper extremities Gait: normal gait  and balance CV: distal pulses palpable throughout   Skin: Skin is warm and dry. No rash noted. Pt is not diaphoretic.  Psychiatric: Pt has a normal mood and affect. Behavior is normal. Judgment and thought content normal.  Nursing note and vitals reviewed.  ED Results / Procedures / Treatments   Labs (all labs ordered are listed, but only abnormal results are displayed) Labs Reviewed  CBC WITH DIFFERENTIAL/PLATELET - Abnormal; Notable for the following components:      Result Value   Platelets 463 (*)    All other components within normal limits  BASIC METABOLIC PANEL - Abnormal; Notable for the following components:   Glucose, Bld 104 (*)    All other components within normal limits  HCG, QUANTITATIVE, PREGNANCY  I-STAT BETA HCG BLOOD, ED ([facility], [facility], [facility] ONLY)    EKG None  Radiology MR Brain W and Wo Contrast  Result Date: 07/28/2022 CLINICAL DATA:  Headache and vision change. Possible multiple sclerosis. Rule out optic neuritis. History of meningioma resection 2018 EXAM: MRI HEAD AND ORBITS WITHOUT AND WITH CONTRAST TECHNIQUE: Multiplanar, multiecho pulse sequences of the brain and surrounding structures were obtained without  and with intravenous contrast. Multiplanar, multiecho pulse sequences of the orbits and surrounding structures were obtained including fat saturation techniques, before and after intravenous contrast administration. CONTRAST:  97m GADAVIST GADOBUTROL 1 MMOL/ML IV SOLN COMPARISON:  MRI head 01/15/2020 FINDINGS: MRI HEAD FINDINGS Brain: Bifrontal craniotomy for meningioma resection. There is postop encephalomalacia in the anterior inferior frontal lobes bilaterally. Enhancing residual tumor is present along the posterior planum sphenoidale. Enhancing tissue measures approximately 9 x 16 mm with very mild progression from the prior MRI. Tumor extends into the suprasellar cistern. Negative for acute infarct.  Negative for hydrocephalus. Vascular: Normal arterial flow  voids. Skull and upper cervical spine: Bifrontal craniotomy. Other: None MRI ORBITS FINDINGS Orbits: The globe is normal and symmetric bilaterally. Lens normal. There is thickening and enhancement of the optic nerve bilaterally near the orbital apex and optic foramen, left greater than right likely due to meningioma spread. There may be some hyperintense signal in the left optic nerve on coronal T2 image 12. Optic chiasm normal. The tumor is immediately anterior to the chiasm. Visualized sinuses: Mild mucosal edema paranasal sinuses. No air-fluid level. Soft tissues: No soft tissue edema in the face. IMPRESSION: 1. Postop bifrontal craniotomy for meningioma resection. There is residual tumor along the posterior planum sphenoidale which is slightly larger than on the prior MRI of 01/15/2020. 2. Thickening and enhancement of the optic nerve bilaterally near the orbital apex and optic foramen, left greater than right likely due to meningioma spread. Possible hyperintense signal in the left optic nerve. 3. Negative for acute infarct. Electronically Signed   By: CFranchot GalloM.D.   On: 07/28/2022 19:42   MR ORBITS W WO CONTRAST  Result Date: 07/28/2022 CLINICAL DATA:  Headache and vision change. Possible multiple sclerosis. Rule out optic neuritis. History of meningioma resection 2018 EXAM: MRI HEAD AND ORBITS WITHOUT AND WITH CONTRAST TECHNIQUE: Multiplanar, multiecho pulse sequences of the brain and surrounding structures were obtained without and with intravenous contrast. Multiplanar, multiecho pulse sequences of the orbits and surrounding structures were obtained including fat saturation techniques, before and after intravenous contrast administration. CONTRAST:  13mGADAVIST GADOBUTROL 1 MMOL/ML IV SOLN COMPARISON:  MRI head 01/15/2020 FINDINGS: MRI HEAD FINDINGS Brain: Bifrontal craniotomy for meningioma resection. There is postop encephalomalacia in the anterior inferior frontal lobes bilaterally.  Enhancing residual tumor is present along the posterior planum sphenoidale. Enhancing tissue measures approximately 9 x 16 mm with very mild progression from the prior MRI. Tumor extends into the suprasellar cistern. Negative for acute infarct.  Negative for hydrocephalus. Vascular: Normal arterial flow voids. Skull and upper cervical spine: Bifrontal craniotomy. Other: None MRI ORBITS FINDINGS Orbits: The globe is normal and symmetric bilaterally. Lens normal. There is thickening and enhancement of the optic nerve bilaterally near the orbital apex and optic foramen, left greater than right likely due to meningioma spread. There may be some hyperintense signal in the left optic nerve on coronal T2 image 12. Optic chiasm normal. The tumor is immediately anterior to the chiasm. Visualized sinuses: Mild mucosal edema paranasal sinuses. No air-fluid level. Soft tissues: No soft tissue edema in the face. IMPRESSION: 1. Postop bifrontal craniotomy for meningioma resection. There is residual tumor along the posterior planum sphenoidale which is slightly larger than on the prior MRI of 01/15/2020. 2. Thickening and enhancement of the optic nerve bilaterally near the orbital apex and optic foramen, left greater than right likely due to meningioma spread. Possible hyperintense signal in the left optic nerve. 3. Negative for acute  infarct. Electronically Signed   By: Franchot Gallo M.D.   On: 07/28/2022 19:42   MR Cervical Spine W or Wo Contrast  Result Date: 07/28/2022 CLINICAL DATA:  Vision changes, headache EXAM: MRI CERVICAL SPINE WITHOUT AND WITH CONTRAST TECHNIQUE: Multiplanar and multiecho pulse sequences of the cervical spine, to include the craniocervical junction and cervicothoracic junction, were obtained without and with intravenous contrast. CONTRAST:  44m GADAVIST GADOBUTROL 1 MMOL/ML IV SOLN COMPARISON:  CT 06/10/2019 FINDINGS: Technical Note: Despite efforts by the technologist and patient, motion  artifact is present on today's exam and could not be eliminated. This reduces exam sensitivity and specificity. Alignment: Straightening of the cervical lordosis. No static listhesis. Vertebrae: No fracture, evidence of discitis, or bone lesion. Cord: Slightly limited evaluation of the cord secondary to motion artifact. Within this limitation, there is no discernible cord signal abnormality or cord lesion. No abnormal enhancement on postcontrast sequences. Posterior Fossa, vertebral arteries, paraspinal tissues: Negative. Disc levels: Negative. Intervertebral disc heights are preserved without disc desiccation or focal disc protrusion. Normal facet joints. No foraminal or canal stenosis at any level. IMPRESSION: 1. Slightly limited evaluation of the cord secondary to motion artifact. Within this limitation, there is no discernible cord signal abnormality or cord lesion. No abnormal enhancement on postcontrast sequences. 2. Straightening of the cervical lordosis, which may be positional. 3. Otherwise unremarkable MRI of the cervical spine. No foraminal or canal stenosis at any level. Electronically Signed   By: NDavina PokeD.O.   On: 07/28/2022 19:28    Procedures Procedures    Medications Ordered in ED Medications  prochlorperazine (COMPAZINE) injection 10 mg (10 mg Intravenous Given 07/28/22 1552)  diphenhydrAMINE (BENADRYL) injection 12.5 mg (12.5 mg Intravenous Given 07/28/22 1550)  gadobutrol (GADAVIST) 1 MMOL/ML injection 10 mL (10 mLs Intravenous Contrast Given 07/28/22 1910)  gadobutrol (GADAVIST) 1 MMOL/ML injection 10 mL (10 mLs Intravenous Contrast Given 07/28/22 1913)  gadobutrol (GADAVIST) 1 MMOL/ML injection 10 mL (10 mLs Intravenous Contrast Given 07/28/22 1916)    ED Course/ Medical Decision Making/ A&P    36year old history of meningioma, recurrent headaches here for evaluation migraine over the last 3 days.  Unfortunately sounds like she was lost to follow-up with Dr.  NKathyrn Sheriff neurosurgery.  She is supposed to have repeat MRI of her brain this past summer however was unable to have this done due to lack of insurance.  She was actually seen by ophthalmology 1 month ago for change in vision for she was told she had something "behind my left eye."  She is unsure what this was.  They had recommended further imaging however again patient was uninsured and did not follow-up.  She is unsure if this was a CT or MRI.  She denies any current vision changes.  She has a nonfocal neuroexam without deficit.  No fever, midline cervical tenderness.  No sudden onset thunderclap headache to suggest bleed.  Unable to view notes from Optho.  Unsure if he was referring to possible optic neuritis.  Given she is post to have MRI with neurosurgery will obtain MRI brain, orbits, cervical spine to rule out MS as cause of her symptoms.  Will also provide migraine cocktail check basic labs.  Does have history of epilepsy however is compliant with her home seizure meds.  No recent seizure-like activity.  Labs and imaging personally viewed and interpreted:  CBC without leukocytosis BMP without significant findings Preg neg MR brain and orbits postop bifrontal craniotomy for meningioma resection, residual tumor likely  larger than MRI in 2021.  Thickening enhancement of optic nerve bilaterally left greater than right likely due to meningioma, possible hyper intense signal left optic nerve MR cervical without significant abnormality   CONSULT with Dr. Cheral Marker with neurology.  States imaging findings and MRI are likely due to meningioma, no evidence of MS  Attempted to consult neurosurgery to discuss MR and obtain FU however patient states she has to leave prior to call back. Will have her FU outpatient with Dr. Kathyrn Sheriff.  She continues to have non focal Neuroexam here in the ED. Low suspicion for MS, SAH, ICH, CVA, mass effect, vasogenic edema. No CN deficit on exam.  Discussed plan with  patient. Agreeable for outpatient FU.  The patient has been appropriately medically screened and/or stabilized in the ED. I have low suspicion for any other emergent medical condition which would require further screening, evaluation or treatment in the ED or require inpatient management.  Patient is hemodynamically stable and in no acute distress.  Patient able to ambulate in department prior to ED.  Evaluation does not show acute pathology that would require ongoing or additional emergent interventions while in the emergency department or further inpatient treatment.  I have discussed the diagnosis with the patient and answered all questions.  Pain is been managed while in the emergency department and patient has no further complaints prior to discharge.  Patient is comfortable with plan discussed in room and is stable for discharge at this time.  I have discussed strict return precautions for returning to the emergency department.  Patient was encouraged to follow-up with PCP/specialist refer to at discharge.                            Medical Decision Making Amount and/or Complexity of Data Reviewed Independent Historian: friend External Data Reviewed: labs, radiology and notes. Labs: ordered. Decision-making details documented in ED Course. Radiology: ordered and independent interpretation performed. Decision-making details documented in ED Course.  Risk OTC drugs. Prescription drug management. Parenteral controlled substances. Decision regarding hospitalization. Diagnosis or treatment significantly limited by social determinants of health.          Final Clinical Impression(s) / ED Diagnoses Final diagnoses:  Meningioma ([place])  Acute nonintractable headache, unspecified headache type    Rx / DC Orders ED Discharge Orders     None         Dinia Joynt A, PA-C 07/28/22 2226    Leanord Asal K, DO 07/28/22 2235

## 2022-07-28 NOTE — ED Notes (Signed)
Pt remains in MRI 

## 2022-07-28 NOTE — Discharge Instructions (Addendum)
Call Dr. Cleotilde Neer office to make an appointment  Return for new or worsening symptoms

## 2022-07-28 NOTE — ED Provider Triage Note (Signed)
Emergency Medicine Provider Triage Evaluation Note  Kendra [name] , a 36 y.o. female  was evaluated in triage.  Pt complains of headache, left-sided, severe, in spite of taking gabapentin.  She does have a history of migraines, but also history of prior meningioma resection.  She has not had follow-up with her neurosurgeon.  Review of Systems  Positive: Headache Negative: No vision changes, no weakness in her extremities  Physical Exam  BP (!) 143/67 (BP Location: Right Arm)   Pulse 93   Temp 98.4 F (36.9 C)   Resp 16   Ht 5' (1.524 m)   Wt 113.9 kg   SpO2 96%   BMI 49.02 kg/m  Gen:   Awake, no distress speaking clearly Resp:  Normal effort no increased work of breathing MSK:   Moves extremities without difficulty no deformity Other:  Neuro: Face symmetric, speech clear, moves all extremities to command appropriately  Medical Decision Making  Medically screening exam initiated at 12:45 PM.  Appropriate orders placed.  Kendra [name] was informed that the remainder of the evaluation will be completed by another provider, this initial triage assessment does not replace that evaluation, and the importance of remaining in the ED until their evaluation is complete.   Kendra Muskrat, MD 07/28/22 1246

## 2022-07-28 NOTE — ED Triage Notes (Signed)
Had brain surgery approx 5 years ago but is having more frequent headaches.  Eye doctor wanted her to have a CT due to continued medications.

## 2022-08-02 ENCOUNTER — Other Ambulatory Visit: Payer: Self-pay | Admitting: Radiation Therapy

## 2022-08-09 ENCOUNTER — Inpatient Hospital Stay: Payer: No Typology Code available for payment source | Attending: Neurosurgery

## 2022-08-11 NOTE — Progress Notes (Incomplete)
Location/Histology of Brain Tumor:  Meningioma   Mri of orbit/ brain : 07-28-22 IMPRESSION: 1. Postop bifrontal craniotomy for meningioma resection. There is residual tumor along the posterior planum sphenoidale which is slightly larger than on the prior MRI of 01/15/2020. 2. Thickening and enhancement of the optic nerve bilaterally near the orbital apex and optic foramen, left greater than right likely due to meningioma spread. Possible hyperintense signal in the left optic nerve. 3. Negative for acute infarct.    Patient presented with symptoms of:    Past or anticipated interventions, if any, per neurosurgery:        Past or anticipated interventions, if any, per medical oncology: none at this time  Dose of Decadron, if applicable: not applicable  Recent neurologic symptoms, if any:  Seizures: {:18581} Headaches: {:18581} Nausea: {:18581} Dizziness/ataxia: {:18581} Difficulty with hand coordination: {:18581} Focal numbness/weakness: {:18581} Visual deficits/changes: {:18581} Confusion/Memory deficits: {:18581}  Painful bone metastases at present, if any: none at this time  SAFETY ISSUES: Prior radiation? {:18581} Pacemaker/ICD? {:18581} Possible current pregnancy? {:18581} Is the patient on methotrexate? {:18581}  Additional Complaints / other details: ***

## 2022-08-13 ENCOUNTER — Telehealth: Payer: Self-pay

## 2022-08-13 NOTE — Telephone Encounter (Signed)
RN Anderson Malta called md office to attempt to obtain additional information on this pt for consultation next week. Rn received the after hours voice mail. Rn also [employer] with no response today. Rn will attempt to get more information on Monday for this pt. No information is available in care everywhere related to this diagnosis and consultation.

## 2022-08-16 NOTE — Progress Notes (Incomplete)
Radiation Oncology         [phone] ________________________________  Initial Outpatient Consultation  Name: Kendra [name] MRN: [id number]  Date: 08/17/2022  DOB: Mar 25, 1986  [id number], Margaretha Sheffield, MD  Consuella Lose, MD   REFERRING PHYSICIAN: Consuella Lose, MD  DIAGNOSIS:    ICD-10-CM   1. Meningioma ([place])  D32.9      Recent imaging with concern for recurrent meningioma -- s/p bifrontal craniotomy for meningioma resection in April 2018 under Dr. Kathyrn Sheriff   Cancer Staging  No matching staging information was found for the patient.  CHIEF COMPLAINT: Here to discuss management of recurrent meningioma  HISTORY OF PRESENT ILLNESS::Kendra [name] is a 36 y.o. female who is seen as a courtesy of Dr. Kathyrn Sheriff for an opinion concerning radiation therapy as part of management for her suspected recurrent meningioma. On record review, it seems as though the patient was initially found to have an enhancing anterior cranial fossa midline mass consistent with meningioma on an MRI performed back in 2008-2009. MRI's of the brain performed in 2013 and 2017 showed a further increase in size of the mass. Given a continued increase in size over the years, she was referred to neurosurgery and underwent resectioning of the mass with Dr. Kathyrn Sheriff in April 2018. 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 She continued with follow-up imaging until 2021 and was without evidence of recurrent tumor until recent history (detailed as follows).   The patient presented to the ED on 07/28/22 with the cc of worsening left sided headaches x 3 days. Per encounter notes, the patient also noted being seen by her ophthalmologist in October for vision changes (resolved) who told her that she "had something behind her left eye", however she was unsure exactly what that meant. She was also noted to be unable to have her routine brain MRI this past summer given that she was uninsured at that time. Her ophthalmologist  also advised further imaging but she was still uninsured at the time.   MRI of the head and orbits with and without contrast performed in the ED showed slight enlargement of the residual tumor along the posterior planum sphenoidale (s/p bifrontal craniotomy for meningioma resection) when compared to her last brain MRI on 01/15/2020. MRI also showed thickening and enhancement of the optic nerve bilaterally near the orbital apex and optic foramen, left greater than right, likely due to meningioma spread, and possible hyperintense signaling in the left optic nerve. MRI otherwise showed no evidence of acute infarct or other intracranial abnormalities.      MRI of the cervical spine with and w/o contrast also performed in the ED showed no discernible cord signal abnormality, cord lesion, or abnormal enhancements (however this evaluation was limited secondary to a motion artifact).   Neurology was consulted who noted concern for recurrent meningioma based on MRI findings, and advised follow-up with outpatient neurosurgery for further evaluation.   ***  PREVIOUS RADIATION THERAPY: No  PAST MEDICAL HISTORY:  has a past medical history of Anxiety, Asthma, Back pain, Bilateral swelling of feet and ankles, Constipation, Convulsions/seizures ([place]) (07/27/2016), Depression, Epilepsy ([place]), GERD (gastroesophageal reflux disease), H/O: C-section, Headache, Joint pain, Lipoma, Lipoma of LUQ abdomen, s/p removal 06/27/2012 (05/24/2012), Meningioma ([place]) (07/27/2016), Multiple food allergies, Other fatigue, Shortness of breath, and Shortness of breath on exertion.    PAST SURGICAL HISTORY: Past Surgical History:  Procedure Laterality Date   ABDOMINAL SURGERY  12/75/1700   APPLICATION OF CRANIAL NAVIGATION N/A 12/10/2016   Procedure: APPLICATION OF CRANIAL  NAVIGATION;  Surgeon: Consuella Lose, MD;  Location: [facility];  Service: Neurosurgery;  Laterality: N/A;   BRAIN MENINGIOMA EXCISION  07/27/2016   BRAIN SURGERY      BREAST SURGERY  12/30/11   breast reduction   CESAREAN SECTION  09/09/2010   [place]   CESAREAN SECTION N/A 01/13/2015   Procedure: CESAREAN SECTION;  Surgeon: Janyth Pupa, DO;  Location: [facility];  Service: Obstetrics;  Laterality: N/A;  EDD 01/18/15 would like [place] dressing   CHOLECYSTECTOMY N/A 11/23/2016   Procedure: LAPAROSCOPIC CHOLECYSTECTOMY WITH INTRAOPERATIVE CHOLANGIOGRAM;  Surgeon: Donnie Mesa, MD;  Location: [facility];  Service: General;  Laterality: N/A;   CRANIOTOMY N/A 12/10/2016   Procedure: BICORONAL CRANIOTOMY FOR RESECTION OF TUMOR;  Surgeon: Consuella Lose, MD;  Location: [facility];  Service: Neurosurgery;  Laterality: N/A;  BICORONAL CRANIOTOMY FOR RESECTION OF TUMOR    INTRAPERITONEAL PORT PLACEMENT  12/10/2016   LIPOMA EXCISION  06/27/2012   LUQ   LYMPH NODE DISSECTION  2013   Abdominal    FAMILY HISTORY: family history includes Cancer in her maternal aunt, maternal grandfather, and maternal grandmother; Deep vein thrombosis in her maternal grandfather; Diabetes in her paternal grandfather; Healthy in her father; Heart disease in her maternal uncle; Kidney disease in her mother; Liver cancer in her maternal uncle; Stroke in her maternal uncle.  SOCIAL HISTORY:  reports that she quit smoking about 6 years ago. Her smoking use included cigars. She started smoking about 22 years ago. She has never used smokeless tobacco. She reports current drug use. Drug: Marijuana. She reports that she does not drink alcohol.  ALLERGIES: Amoxicillin, Asa buff (mag [buffered aspirin], Erythromycin, Iodine, Peanut-containing drug products, Shellfish allergy, Doxycycline, Other, and Ibuprofen  MEDICATIONS:  Current Outpatient Medications  Medication Sig Dispense Refill   acetaminophen (TYLENOL) 500 MG tablet Take 1,000 mg by mouth every 6 (six) hours as needed (for pain/headaches.).     albuterol (VENTOLIN HFA) 108 (90 Base) MCG/ACT inhaler Inhale 2 puffs into the lungs every 6 (six) hours as  needed for wheezing or shortness of breath. 1 each 0   budesonide-formoterol (SYMBICORT) 160-4.5 MCG/ACT inhaler Inhale 2 puffs into the lungs 2 (two) times daily. 1 each 0   cetirizine (ZYRTEC) 10 MG tablet Take 10 mg by mouth daily.     diphenhydrAMINE (BENADRYL) 25 mg capsule Take 1 capsule (25 mg total) by mouth every 6 (six) hours as needed. (Patient taking differently: Take 25 mg by mouth every 6 (six) hours as needed for allergies.) 30 capsule 0   doxycycline (VIBRAMYCIN) 100 MG capsule Take 100 mg by mouth daily.     EPINEPHrine 0.3 mg/0.3 mL IJ SOAJ injection Inject 0.3 mg into the skin once.     fluticasone (FLONASE) 50 MCG/ACT nasal spray Place 1 spray into both nostrils daily. 16 g 0   gabapentin (NEURONTIN) 100 MG capsule TAKE 1 CAPSULE(100 MG) BY MOUTH AT BEDTIME 30 capsule 0   lamoTRIgine (LAMICTAL) 100 MG tablet TAKE 2 TABLETS(200 MG) BY MOUTH TWICE DAILY 120 tablet 2   LINZESS 290 MCG CAPS capsule Take 290 mcg by mouth daily as needed (constipation).   3   pantoprazole (PROTONIX) 40 MG tablet Take 1 tablet (40 mg) twice daily for two weeks. Then take 1 tablet (40 mg) daily. 90 tablet 3   Polyethylene Glycol 3350 (MIRALAX PO) Take by mouth.     psyllium (METAMUCIL) 58.6 % powder Take 1 packet by mouth as needed.     Tretinoin Microsphere (RETIN-A  MICRO PUMP) 0.08 % GEL Apply 1 application topically at bedtime. Apply one application topically to face each night as needed.     Vitamin D, Ergocalciferol, (DRISDOL) 1.25 MG (50000 UNIT) CAPS capsule Take 1 capsule (50,000 Units total) by mouth every 7 (seven) days. 4 capsule 0   zinc gluconate 50 MG tablet Take 50 mg by mouth daily.     No current facility-administered medications for this encounter.    REVIEW OF SYSTEMS:  Notable for that above.   PHYSICAL EXAM:  vitals were not taken for this visit.   General: Alert and oriented, in no acute distress *** HEENT: Head is normocephalic. Extraocular movements are intact. Oropharynx  is clear. Neck: Neck is supple, no palpable cervical or supraclavicular lymphadenopathy. Heart: Regular in rate and rhythm with no murmurs, rubs, or gallops. Chest: Clear to auscultation bilaterally, with no rhonchi, wheezes, or rales. Abdomen: Soft, nontender, nondistended, with no rigidity or guarding. Extremities: No cyanosis or edema. Lymphatics: see Neck Exam Skin: No concerning lesions. Musculoskeletal: symmetric strength and muscle tone throughout. Neurologic: Cranial nerves II through XII are grossly intact. No obvious focalities. Speech is fluent. Coordination is intact. Psychiatric: Judgment and insight are intact. Affect is appropriate.   ECOG = ***  0 - Asymptomatic (Fully active, able to carry on all predisease activities without restriction)  1 - Symptomatic but completely ambulatory (Restricted in physically strenuous activity but ambulatory and able to carry out work of a light or sedentary nature. For example, light housework, office work)  2 - Symptomatic, <50% in bed during the day (Ambulatory and capable of all self care but unable to carry out any work activities. Up and about more than 50% of waking hours)  3 - Symptomatic, >50% in bed, but not bedbound (Capable of only limited self-care, confined to bed or chair 50% or more of waking hours)  4 - Bedbound (Completely disabled. Cannot carry on any self-care. Totally confined to bed or chair)  5 - Death   Eustace Pen MM, Creech RH, Tormey DC, et al. [phone]). "Toxicity and response criteria of the [facility]". Ruth Oncol. 5 (6): 649-55   LABORATORY DATA:  Lab Results  Component Value Date   WBC 6.3 07/28/2022   HGB 13.8 07/28/2022   HCT 43.0 07/28/2022   MCV 94.3 07/28/2022   PLT 463 (H) 07/28/2022   CMP     Component Value Date/Time   NA 138 07/28/2022 1519   NA 138 11/19/2020 1049   K 3.8 07/28/2022 1519   CL 100 07/28/2022 1519   CO2 24 07/28/2022 1519   GLUCOSE 104 (H)  07/28/2022 1519   BUN 11 07/28/2022 1519   BUN 10 11/19/2020 1049   CREATININE 0.87 07/28/2022 1519   CALCIUM 9.2 07/28/2022 1519   PROT 7.3 11/19/2020 1049   ALBUMIN 4.5 11/19/2020 1049   AST 12 11/19/2020 1049   ALT 14 11/19/2020 1049   ALKPHOS 101 11/19/2020 1049   BILITOT 0.3 11/19/2020 1049   GFRNONAA >60 07/28/2022 1519   GFRAA 115 09/19/2019 1407         RADIOGRAPHY: MR Brain W and Wo Contrast  Result Date: 07/28/2022 CLINICAL DATA:  Headache and vision change. Possible multiple sclerosis. Rule out optic neuritis. History of meningioma resection 2018 EXAM: MRI HEAD AND ORBITS WITHOUT AND WITH CONTRAST TECHNIQUE: Multiplanar, multiecho pulse sequences of the brain and surrounding structures were obtained without and with intravenous contrast. Multiplanar, multiecho pulse sequences of the orbits and  surrounding structures were obtained including fat saturation techniques, before and after intravenous contrast administration. CONTRAST:  42m GADAVIST GADOBUTROL 1 MMOL/ML IV SOLN COMPARISON:  MRI head 01/15/2020 FINDINGS: MRI HEAD FINDINGS Brain: Bifrontal craniotomy for meningioma resection. There is postop encephalomalacia in the anterior inferior frontal lobes bilaterally. Enhancing residual tumor is present along the posterior planum sphenoidale. Enhancing tissue measures approximately 9 x 16 mm with very mild progression from the prior MRI. Tumor extends into the suprasellar cistern. Negative for acute infarct.  Negative for hydrocephalus. Vascular: Normal arterial flow voids. Skull and upper cervical spine: Bifrontal craniotomy. Other: None MRI ORBITS FINDINGS Orbits: The globe is normal and symmetric bilaterally. Lens normal. There is thickening and enhancement of the optic nerve bilaterally near the orbital apex and optic foramen, left greater than right likely due to meningioma spread. There may be some hyperintense signal in the left optic nerve on coronal T2 image 12. Optic chiasm  normal. The tumor is immediately anterior to the chiasm. Visualized sinuses: Mild mucosal edema paranasal sinuses. No air-fluid level. Soft tissues: No soft tissue edema in the face. IMPRESSION: 1. Postop bifrontal craniotomy for meningioma resection. There is residual tumor along the posterior planum sphenoidale which is slightly larger than on the prior MRI of 01/15/2020. 2. Thickening and enhancement of the optic nerve bilaterally near the orbital apex and optic foramen, left greater than right likely due to meningioma spread. Possible hyperintense signal in the left optic nerve. 3. Negative for acute infarct. Electronically Signed   By: CFranchot GalloM.D.   On: 07/28/2022 19:42   MR ORBITS W WO CONTRAST  Result Date: 07/28/2022 CLINICAL DATA:  Headache and vision change. Possible multiple sclerosis. Rule out optic neuritis. History of meningioma resection 2018 EXAM: MRI HEAD AND ORBITS WITHOUT AND WITH CONTRAST TECHNIQUE: Multiplanar, multiecho pulse sequences of the brain and surrounding structures were obtained without and with intravenous contrast. Multiplanar, multiecho pulse sequences of the orbits and surrounding structures were obtained including fat saturation techniques, before and after intravenous contrast administration. CONTRAST:  172mGADAVIST GADOBUTROL 1 MMOL/ML IV SOLN COMPARISON:  MRI head 01/15/2020 FINDINGS: MRI HEAD FINDINGS Brain: Bifrontal craniotomy for meningioma resection. There is postop encephalomalacia in the anterior inferior frontal lobes bilaterally. Enhancing residual tumor is present along the posterior planum sphenoidale. Enhancing tissue measures approximately 9 x 16 mm with very mild progression from the prior MRI. Tumor extends into the suprasellar cistern. Negative for acute infarct.  Negative for hydrocephalus. Vascular: Normal arterial flow voids. Skull and upper cervical spine: Bifrontal craniotomy. Other: None MRI ORBITS FINDINGS Orbits: The globe is normal and  symmetric bilaterally. Lens normal. There is thickening and enhancement of the optic nerve bilaterally near the orbital apex and optic foramen, left greater than right likely due to meningioma spread. There may be some hyperintense signal in the left optic nerve on coronal T2 image 12. Optic chiasm normal. The tumor is immediately anterior to the chiasm. Visualized sinuses: Mild mucosal edema paranasal sinuses. No air-fluid level. Soft tissues: No soft tissue edema in the face. IMPRESSION: 1. Postop bifrontal craniotomy for meningioma resection. There is residual tumor along the posterior planum sphenoidale which is slightly larger than on the prior MRI of 01/15/2020. 2. Thickening and enhancement of the optic nerve bilaterally near the orbital apex and optic foramen, left greater than right likely due to meningioma spread. Possible hyperintense signal in the left optic nerve. 3. Negative for acute infarct. Electronically Signed   By: ChFranchot Gallo.D.  On: 07/28/2022 19:42   MR Cervical Spine W or Wo Contrast  Result Date: 07/28/2022 CLINICAL DATA:  Vision changes, headache EXAM: MRI CERVICAL SPINE WITHOUT AND WITH CONTRAST TECHNIQUE: Multiplanar and multiecho pulse sequences of the cervical spine, to include the craniocervical junction and cervicothoracic junction, were obtained without and with intravenous contrast. CONTRAST:  74m GADAVIST GADOBUTROL 1 MMOL/ML IV SOLN COMPARISON:  CT 06/10/2019 FINDINGS: Technical Note: Despite efforts by the technologist and patient, motion artifact is present on today's exam and could not be eliminated. This reduces exam sensitivity and specificity. Alignment: Straightening of the cervical lordosis. No static listhesis. Vertebrae: No fracture, evidence of discitis, or bone lesion. Cord: Slightly limited evaluation of the cord secondary to motion artifact. Within this limitation, there is no discernible cord signal abnormality or cord lesion. No abnormal enhancement on  postcontrast sequences. Posterior Fossa, vertebral arteries, paraspinal tissues: Negative. Disc levels: Negative. Intervertebral disc heights are preserved without disc desiccation or focal disc protrusion. Normal facet joints. No foraminal or canal stenosis at any level. IMPRESSION: 1. Slightly limited evaluation of the cord secondary to motion artifact. Within this limitation, there is no discernible cord signal abnormality or cord lesion. No abnormal enhancement on postcontrast sequences. 2. Straightening of the cervical lordosis, which may be positional. 3. Otherwise unremarkable MRI of the cervical spine. No foraminal or canal stenosis at any level. Electronically Signed   By: NDavina PokeD.O.   On: 07/28/2022 19:28      IMPRESSION/PLAN:***    On date of service, in total, I spent *** minutes on this encounter. Patient was seen in person.   __________________________________________   SEppie Gibson MD  This document serves as a record of services personally performed by SEppie Gibson MD. It was created on her behalf by ERoney Mans a trained medical scribe. The creation of this record is based on the scribe's personal observations and the provider's statements to them. This document has been checked and approved by the attending provider.

## 2022-08-17 ENCOUNTER — Telehealth: Payer: Self-pay

## 2022-08-17 ENCOUNTER — Ambulatory Visit
Admission: RE | Admit: 2022-08-17 | Discharge: 2022-08-17 | Disposition: A | Discharge status: Home / Self Care | Payer: No Typology Code available for payment source | Source: Ambulatory Visit | Attending: Radiation Oncology | Admitting: Radiation Oncology

## 2022-08-17 ENCOUNTER — Ambulatory Visit: Admission: RE | Admit: 2022-08-17 | Payer: No Typology Code available for payment source | Source: Ambulatory Visit

## 2022-08-17 DIAGNOSIS — D329 Benign neoplasm of meninges, unspecified: Secondary | ICD-10-CM

## 2022-08-17 NOTE — Telephone Encounter (Signed)
Rn Anderson Malta called pt when she did not come to appointment. Rn got pt on phone and she stated that she forgot about the appointment and wanted to reschedule. Rn will have admin team reschedule this pt.

## 2022-08-19 NOTE — Progress Notes (Signed)
Radiation Oncology         [phone]) [phone] ________________________________  Initial Outpatient Consultation  Name: Kendra [name] MRN: [id number]  Date: 08/20/2022  DOB: March 30, 1986  PP:[id number], Margaretha Sheffield, MD  Consuella Lose, MD   REFERRING PHYSICIAN: Consuella Lose, MD  DIAGNOSIS: D32.0   ICD-10-CM   1. Meningioma (HCC)  D32.9 Pregnancy, urine    LORazepam (ATIVAN) 1 MG tablet    2. Screening for hypothyroidism  Z13.29 TSH    T4, free    3. Benign tumor of meningioma (cerebral) ([place])  D32.0      Recent imaging with concern for recurrent meningioma -- s/p bifrontal craniotomy for meningioma resection in April 2018 under Dr. Kathyrn Sheriff    CHIEF COMPLAINT: Here to discuss management of recurrent meningioma  HISTORY OF PRESENT ILLNESS::Kendra [name] is a 36 y.o. female who is seen as a courtesy of Dr. Kathyrn Sheriff for an opinion concerning radiation therapy as part of management for her suspected recurrent meningioma. On record review, it seems as though the patient was initially found to have an enhancing anterior cranial fossa midline mass consistent with meningioma on an MRI performed back in 2008-2009. MRI's of the brain performed in 2013 and 2017 showed a further increase in size of the mass. Given a continued increase in size over the years, she was referred to neurosurgery and underwent resectioning of the mass with Dr. Kathyrn Sheriff in April 2018. 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 She continued with follow-up imaging until 2021 and was without evidence of recurrent tumor until recent history (detailed as follows).   The patient presented to the ED on 07/28/22 with the cc of worsening left sided headaches x 3 days. Per encounter notes, the patient also noted being seen by her ophthalmologist in October for vision changes (resolved) who told her that she "had something behind her left eye", however she was unsure exactly what that meant. She was also noted to be unable to have her  routine brain MRI this past summer given that she was uninsured at that time. Her ophthalmologist also advised further imaging but she was still uninsured at the time.   MRI of the head and orbits with and without contrast performed in the ED showed slight enlargement of the residual tumor along the posterior planum sphenoidale on 07/28/2022 (s/p bifrontal craniotomy for meningioma resection) when compared to her last brain MRI on 01/15/2020. MRI also showed thickening and enhancement of the optic nerve bilaterally near the orbital apex and optic foramen, left greater than right, likely due to meningioma spread, and possible hyperintense signaling in the left optic nerve. MRI otherwise showed no evidence of acute infarct or other intracranial abnormalities.      MRI of the cervical spine with and w/o contrast also performed in the ED showed no discernible cord signal abnormality, cord lesion, or abnormal enhancements (however this evaluation was limited secondary to a motion artifact).   Neurology was consulted who noted concern for recurrent meningioma based on MRI findings, and advised follow-up with outpatient neurosurgery for further evaluation.   She was discussed recently at CNS tumor board.  The consensus is that she should be seen in radiation oncology for consideration of salvage radiation therapy given the proximity of her tumor to optic structures.  Patient reports that she has had chronic left ear pain and left upper neck pain in the region of her jaw ever since her surgery for her meningioma.  She reports chronic headaches.  She works as a traveling  CNA.   PREVIOUS RADIATION THERAPY: No  PAST MEDICAL HISTORY:  has a past medical history of Anxiety, Asthma, Back pain, Bilateral swelling of feet and ankles, Constipation, Convulsions/seizures ([place]) (07/27/2016), Depression, Epilepsy ([place]), GERD (gastroesophageal reflux disease), H/O: C-section, Headache, Joint pain, Lipoma, Lipoma of LUQ  abdomen, s/p removal 06/27/2012 (05/24/2012), Meningioma ([place]) (07/27/2016), Multiple food allergies, Other fatigue, Shortness of breath, and Shortness of breath on exertion.    PAST SURGICAL HISTORY: Past Surgical History:  Procedure Laterality Date   ABDOMINAL SURGERY  83/15/1761   APPLICATION OF CRANIAL NAVIGATION N/A 12/10/2016   Procedure: APPLICATION OF CRANIAL NAVIGATION;  Surgeon: Consuella Lose, MD;  Location: [facility];  Service: Neurosurgery;  Laterality: N/A;   BRAIN MENINGIOMA EXCISION  07/27/2016   BRAIN SURGERY     BREAST SURGERY  12/30/11   breast reduction   CESAREAN SECTION  09/09/2010   [place]   CESAREAN SECTION N/A 01/13/2015   Procedure: CESAREAN SECTION;  Surgeon: Janyth Pupa, DO;  Location: [facility];  Service: Obstetrics;  Laterality: N/A;  EDD 01/18/15 would like [place] dressing   CHOLECYSTECTOMY N/A 11/23/2016   Procedure: LAPAROSCOPIC CHOLECYSTECTOMY WITH INTRAOPERATIVE CHOLANGIOGRAM;  Surgeon: Donnie Mesa, MD;  Location: [facility];  Service: General;  Laterality: N/A;   CRANIOTOMY N/A 12/10/2016   Procedure: BICORONAL CRANIOTOMY FOR RESECTION OF TUMOR;  Surgeon: Consuella Lose, MD;  Location: [facility];  Service: Neurosurgery;  Laterality: N/A;  BICORONAL CRANIOTOMY FOR RESECTION OF TUMOR    INTRAPERITONEAL PORT PLACEMENT  12/10/2016   LIPOMA EXCISION  06/27/2012   LUQ   LYMPH NODE DISSECTION  2013   Abdominal    FAMILY HISTORY: family history includes Cancer in her maternal aunt, maternal grandfather, and maternal grandmother; Deep vein thrombosis in her maternal grandfather; Diabetes in her paternal grandfather; Healthy in her father; Heart disease in her maternal uncle; Kidney disease in her mother; Liver cancer in her maternal uncle; Stroke in her maternal uncle.  SOCIAL HISTORY:  reports that she quit smoking about 6 years ago. Her smoking use included cigars. She started smoking about 22 years ago. She has never used smokeless tobacco. She reports current drug use. Drug:  Marijuana. She reports that she does not drink alcohol.  ALLERGIES: Amoxicillin, Asa buff (mag [buffered aspirin], Erythromycin, Iodine, Peanut-containing drug products, Shellfish allergy, Doxycycline, Other, and Ibuprofen  MEDICATIONS:  Current Outpatient Medications  Medication Sig Dispense Refill   acetaminophen (TYLENOL) 500 MG tablet Take 1,000 mg by mouth every 6 (six) hours as needed (for pain/headaches.).     albuterol (VENTOLIN HFA) 108 (90 Base) MCG/ACT inhaler Inhale 2 puffs into the lungs every 6 (six) hours as needed for wheezing or shortness of breath. 1 each 0   budesonide-formoterol (SYMBICORT) 160-4.5 MCG/ACT inhaler Inhale 2 puffs into the lungs 2 (two) times daily. 1 each 0   cetirizine (ZYRTEC) 10 MG tablet Take 10 mg by mouth daily.     diphenhydrAMINE (BENADRYL) 25 mg capsule Take 1 capsule (25 mg total) by mouth every 6 (six) hours as needed. (Patient taking differently: Take 25 mg by mouth every 6 (six) hours as needed for allergies.) 30 capsule 0   doxycycline (VIBRAMYCIN) 100 MG capsule Take 100 mg by mouth daily.     EPINEPHrine 0.3 mg/0.3 mL IJ SOAJ injection Inject 0.3 mg into the skin once.     fluticasone (FLONASE) 50 MCG/ACT nasal spray Place 1 spray into both nostrils daily. 16 g 0   gabapentin (NEURONTIN) 100 MG capsule TAKE 1 CAPSULE(100 MG) BY  MOUTH AT BEDTIME 30 capsule 0   lamoTRIgine (LAMICTAL) 100 MG tablet TAKE 2 TABLETS(200 MG) BY MOUTH TWICE DAILY 120 tablet 2   LINZESS 290 MCG CAPS capsule Take 290 mcg by mouth daily as needed (constipation).   3   LORazepam (ATIVAN) 1 MG tablet Take 1 tab PO 20 minutes before MRI or radiation procedures, prn anxiety, claustrophobia. 7 tablet 0   pantoprazole (PROTONIX) 40 MG tablet Take 1 tablet (40 mg) twice daily for two weeks. Then take 1 tablet (40 mg) daily. 90 tablet 3   Polyethylene Glycol 3350 (MIRALAX PO) Take by mouth.     psyllium (METAMUCIL) 58.6 % powder Take 1 packet by mouth as needed.     Tretinoin  Microsphere (RETIN-A MICRO PUMP) 0.08 % GEL Apply 1 application topically at bedtime. Apply one application topically to face each night as needed.     Vitamin D, Ergocalciferol, (DRISDOL) 1.25 MG (50000 UNIT) CAPS capsule Take 1 capsule (50,000 Units total) by mouth every 7 (seven) days. 4 capsule 0   zinc gluconate 50 MG tablet Take 50 mg by mouth daily.     No current facility-administered medications for this encounter.    REVIEW OF SYSTEMS:  Notable for that above.   PHYSICAL EXAM:  height is '5\' 1"'$  (1.549 m) and weight is 252 lb 3.2 oz (114.4 kg). Her temperature is 97.9 F (36.6 C). Her blood pressure is 126/89 and her pulse is 92. Her respiration is 20 and oxygen saturation is 100%.   General: Alert and oriented, in no distress and crying HEENT: Head is normocephalic. Extraocular movements are intact.  She reports difficulty reading small print but her vision is grossly intact to larger print Neck is with palpable adenopathy Heart: Regular in rate and rhythm with no murmurs, rubs, or gallops. Chest: Clear to auscultation bilaterally, with no rhonchi, wheezes, or rales. Abdomen: Soft, nontender, nondistended, with no rigidity or guarding. Lymphatics: see Neck Exam Skin: No concerning lesions. Musculoskeletal:  Ambulatory Neurologic: Cranial nerves II through XII are grossly intact though subjectively she reports it is difficult to read typed print on paper. No obvious focalities. Speech is fluent. Coordination is intact. Psychiatric: Judgment and insight are intact. Affect is appropriate.  KPS = 90  100 - Normal; no complaints; no evidence of disease. 90   - Able to carry on normal activity; minor signs or symptoms of disease. 80   - Normal activity with effort; some signs or symptoms of disease. 71   - Cares for self; unable to carry on normal activity or to do active work. 60   - Requires occasional assistance, but is able to care for most of his personal needs. 50   - Requires  considerable assistance and frequent medical care. 41   - Disabled; requires special care and assistance. 26   - Severely disabled; hospital admission is indicated although death not imminent. 46   - Very sick; hospital admission necessary; active supportive treatment necessary. 10   - Moribund; fatal processes progressing rapidly. 0     - Dead  Karnofsky DA, Abelmann Clinchco, Craver LS and Burchenal [facility] [phone]) The use of the nitrogen mustards in the palliative treatment of carcinoma: with particular reference to bronchogenic carcinoma Cancer 1 634-56     LABORATORY DATA:  Lab Results  Component Value Date   WBC 6.3 07/28/2022   HGB 13.8 07/28/2022   HCT 43.0 07/28/2022   MCV 94.3 07/28/2022   PLT 463 (H) 07/28/2022  CMP     Component Value Date/Time   NA 138 07/28/2022 1519   NA 138 11/19/2020 1049   K 3.8 07/28/2022 1519   CL 100 07/28/2022 1519   CO2 24 07/28/2022 1519   GLUCOSE 104 (H) 07/28/2022 1519   BUN 11 07/28/2022 1519   BUN 10 11/19/2020 1049   CREATININE 0.87 07/28/2022 1519   CALCIUM 9.2 07/28/2022 1519   PROT 7.3 11/19/2020 1049   ALBUMIN 4.5 11/19/2020 1049   AST 12 11/19/2020 1049   ALT 14 11/19/2020 1049   ALKPHOS 101 11/19/2020 1049   BILITOT 0.3 11/19/2020 1049   GFRNONAA >60 07/28/2022 1519   GFRAA 115 09/19/2019 1407         RADIOGRAPHY: MR Brain W and Wo Contrast  Result Date: 07/28/2022 CLINICAL DATA:  Headache and vision change. Possible multiple sclerosis. Rule out optic neuritis. History of meningioma resection 2018 EXAM: MRI HEAD AND ORBITS WITHOUT AND WITH CONTRAST TECHNIQUE: Multiplanar, multiecho pulse sequences of the brain and surrounding structures were obtained without and with intravenous contrast. Multiplanar, multiecho pulse sequences of the orbits and surrounding structures were obtained including fat saturation techniques, before and after intravenous contrast administration. CONTRAST:  84m GADAVIST GADOBUTROL 1 MMOL/ML IV SOLN  COMPARISON:  MRI head 01/15/2020 FINDINGS: MRI HEAD FINDINGS Brain: Bifrontal craniotomy for meningioma resection. There is postop encephalomalacia in the anterior inferior frontal lobes bilaterally. Enhancing residual tumor is present along the posterior planum sphenoidale. Enhancing tissue measures approximately 9 x 16 mm with very mild progression from the prior MRI. Tumor extends into the suprasellar cistern. Negative for acute infarct.  Negative for hydrocephalus. Vascular: Normal arterial flow voids. Skull and upper cervical spine: Bifrontal craniotomy. Other: None MRI ORBITS FINDINGS Orbits: The globe is normal and symmetric bilaterally. Lens normal. There is thickening and enhancement of the optic nerve bilaterally near the orbital apex and optic foramen, left greater than right likely due to meningioma spread. There may be some hyperintense signal in the left optic nerve on coronal T2 image 12. Optic chiasm normal. The tumor is immediately anterior to the chiasm. Visualized sinuses: Mild mucosal edema paranasal sinuses. No air-fluid level. Soft tissues: No soft tissue edema in the face. IMPRESSION: 1. Postop bifrontal craniotomy for meningioma resection. There is residual tumor along the posterior planum sphenoidale which is slightly larger than on the prior MRI of 01/15/2020. 2. Thickening and enhancement of the optic nerve bilaterally near the orbital apex and optic foramen, left greater than right likely due to meningioma spread. Possible hyperintense signal in the left optic nerve. 3. Negative for acute infarct. Electronically Signed   By: CFranchot GalloM.D.   On: 07/28/2022 19:42   MR ORBITS W WO CONTRAST  Result Date: 07/28/2022 CLINICAL DATA:  Headache and vision change. Possible multiple sclerosis. Rule out optic neuritis. History of meningioma resection 2018 EXAM: MRI HEAD AND ORBITS WITHOUT AND WITH CONTRAST TECHNIQUE: Multiplanar, multiecho pulse sequences of the brain and surrounding  structures were obtained without and with intravenous contrast. Multiplanar, multiecho pulse sequences of the orbits and surrounding structures were obtained including fat saturation techniques, before and after intravenous contrast administration. CONTRAST:  175mGADAVIST GADOBUTROL 1 MMOL/ML IV SOLN COMPARISON:  MRI head 01/15/2020 FINDINGS: MRI HEAD FINDINGS Brain: Bifrontal craniotomy for meningioma resection. There is postop encephalomalacia in the anterior inferior frontal lobes bilaterally. Enhancing residual tumor is present along the posterior planum sphenoidale. Enhancing tissue measures approximately 9 x 16 mm with very mild progression from the prior MRI.  Tumor extends into the suprasellar cistern. Negative for acute infarct.  Negative for hydrocephalus. Vascular: Normal arterial flow voids. Skull and upper cervical spine: Bifrontal craniotomy. Other: None MRI ORBITS FINDINGS Orbits: The globe is normal and symmetric bilaterally. Lens normal. There is thickening and enhancement of the optic nerve bilaterally near the orbital apex and optic foramen, left greater than right likely due to meningioma spread. There may be some hyperintense signal in the left optic nerve on coronal T2 image 12. Optic chiasm normal. The tumor is immediately anterior to the chiasm. Visualized sinuses: Mild mucosal edema paranasal sinuses. No air-fluid level. Soft tissues: No soft tissue edema in the face. IMPRESSION: 1. Postop bifrontal craniotomy for meningioma resection. There is residual tumor along the posterior planum sphenoidale which is slightly larger than on the prior MRI of 01/15/2020. 2. Thickening and enhancement of the optic nerve bilaterally near the orbital apex and optic foramen, left greater than right likely due to meningioma spread. Possible hyperintense signal in the left optic nerve. 3. Negative for acute infarct. Electronically Signed   By: Franchot Gallo M.D.   On: 07/28/2022 19:42   MR Cervical Spine W  or Wo Contrast  Result Date: 07/28/2022 CLINICAL DATA:  Vision changes, headache EXAM: MRI CERVICAL SPINE WITHOUT AND WITH CONTRAST TECHNIQUE: Multiplanar and multiecho pulse sequences of the cervical spine, to include the craniocervical junction and cervicothoracic junction, were obtained without and with intravenous contrast. CONTRAST:  90m GADAVIST GADOBUTROL 1 MMOL/ML IV SOLN COMPARISON:  CT 06/10/2019 FINDINGS: Technical Note: Despite efforts by the technologist and patient, motion artifact is present on today's exam and could not be eliminated. This reduces exam sensitivity and specificity. Alignment: Straightening of the cervical lordosis. No static listhesis. Vertebrae: No fracture, evidence of discitis, or bone lesion. Cord: Slightly limited evaluation of the cord secondary to motion artifact. Within this limitation, there is no discernible cord signal abnormality or cord lesion. No abnormal enhancement on postcontrast sequences. Posterior Fossa, vertebral arteries, paraspinal tissues: Negative. Disc levels: Negative. Intervertebral disc heights are preserved without disc desiccation or focal disc protrusion. Normal facet joints. No foraminal or canal stenosis at any level. IMPRESSION: 1. Slightly limited evaluation of the cord secondary to motion artifact. Within this limitation, there is no discernible cord signal abnormality or cord lesion. No abnormal enhancement on postcontrast sequences. 2. Straightening of the cervical lordosis, which may be positional. 3. Otherwise unremarkable MRI of the cervical spine. No foraminal or canal stenosis at any level. Electronically Signed   By: NDavina PokeD.O.   On: 07/28/2022 19:28      IMPRESSION/PLAN: This is a very pleasant 36year old woman with progressive meningioma after resection.  It has chronically affected her vision.  I recommend radiation therapy to preserve the vision that she still has.  If the tumor continues to grow over time it is  likely to diminish her vision over the years to come  She had some motion artifact on her last MRI.  She reports claustrophobia.  I will give her a prescription for Ativan to use for imaging and before radiation procedures  We will arrange a 3 Tesla MRI in January and she will follow-up with me at that time with treatment planning immediately after follow-up.  Based on the location of the tumor and suspicion for optic nerve involvement it is likely that she will need to be treated with standard fractionation over about 6 weeks.  I will be able to confirm that after tumor board review when we  get a 3 Tesla MRI  Today, I talked to the patient about the findings and work-up thus far.  We discussed the patient's diagnosis of progressive meningioma and general treatment for this, highlighting the role of salvage radiotherapy in the management.  We discussed the available radiation techniques, and focused on the details of logistics and delivery.     We discussed the risks, benefits, and side effects of radiotherapy. Side effects may include but not necessarily be limited to: Skin irritation, temporary hair loss, fatigue, headache, injury to the brain, pituitary dysfunction (depending on final volume radiation treatment, understanding that endocrinology follow-up may be recommended based on how much pituitary gland is exposed), injury to optic structures and decreased vision, secondary cancer in the radiation fields;  no guarantees of treatment were given. A consent form was signed and placed in the patient's medical record.  She understands the alternative to treatment is observation but wishes to proceed with treatment to give herself the best chance of preserving vision.  She understands importance of not getting pregnant during treatment.  Urine pregnancy test ordered today.  We will also order baseline TSH.  The patient was encouraged to ask questions that I answered to the best of my ability.  She is  going to arrange another vision exam with her eye doctor and get the results sent to our clinic as an up-to-date baseline.  On date of service, in total, I spent 60 minutes on this encounter. Patient was seen in person.   __________________________________________   Eppie Gibson, MD  This document serves as a record of services personally performed by Eppie Gibson, MD. It was created on her behalf by Roney Mans, a trained medical scribe. The creation of this record is based on the scribe's personal observations and the provider's statements to them. This document has been checked and approved by the attending provider.

## 2022-08-19 NOTE — Progress Notes (Addendum)
Location/Histology of Brain Tumor:  Meningioma    Mri of orbit/ brain : 07-28-22 IMPRESSION: 1. Postop bifrontal craniotomy for meningioma resection. There is residual tumor along the posterior planum sphenoidale which is slightly larger than on the prior MRI of 01/15/2020. 2. Thickening and enhancement of the optic nerve bilaterally near the orbital apex and optic foramen, left greater than right likely due to meningioma spread. Possible hyperintense signal in the left optic nerve. 3. Negative for acute infarct.     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 none Pacemaker/ICD? no Possible current pregnancy? No per pt report, pt has iud Is the patient on methotrexate? no   Additional Complaints / other details: want an option that does not require sx, has an 50 year and 36 year old  Vitals:   08/20/22 0733  BP: 126/89  Pulse: 92  Resp: 20  Temp: 97.9 F (36.6 C)  SpO2: 100%

## 2022-08-20 ENCOUNTER — Other Ambulatory Visit: Payer: Self-pay | Admitting: Radiation Therapy

## 2022-08-20 ENCOUNTER — Ambulatory Visit
Admission: RE | Admit: 2022-08-20 | Discharge: 2022-08-20 | Disposition: A | Discharge status: Home / Self Care | Payer: Medicaid Other | Source: Ambulatory Visit | Attending: Radiation Oncology | Admitting: Radiation Oncology

## 2022-08-20 ENCOUNTER — Other Ambulatory Visit: Payer: Self-pay

## 2022-08-20 ENCOUNTER — Encounter: Payer: Self-pay | Admitting: Radiation Oncology

## 2022-08-20 VITALS — BP 126/89 | HR 92 | Temp 97.9°F | Resp 20 | Ht 61.0 in | Wt 252.2 lb

## 2022-08-20 DIAGNOSIS — Z1329 Encounter for screening for other suspected endocrine disorder: Secondary | ICD-10-CM | POA: Diagnosis not present

## 2022-08-20 DIAGNOSIS — Z87891 Personal history of nicotine dependence: Secondary | ICD-10-CM | POA: Diagnosis not present

## 2022-08-20 DIAGNOSIS — Z800 Family history of malignant neoplasm of digestive organs: Secondary | ICD-10-CM | POA: Diagnosis not present

## 2022-08-20 DIAGNOSIS — D329 Benign neoplasm of meninges, unspecified: Secondary | ICD-10-CM | POA: Diagnosis present

## 2022-08-20 DIAGNOSIS — G9389 Other specified disorders of brain: Secondary | ICD-10-CM | POA: Diagnosis not present

## 2022-08-20 DIAGNOSIS — G40909 Epilepsy, unspecified, not intractable, without status epilepticus: Secondary | ICD-10-CM | POA: Insufficient documentation

## 2022-08-20 DIAGNOSIS — E039 Hypothyroidism, unspecified: Secondary | ICD-10-CM | POA: Insufficient documentation

## 2022-08-20 DIAGNOSIS — F419 Anxiety disorder, unspecified: Secondary | ICD-10-CM | POA: Diagnosis not present

## 2022-08-20 DIAGNOSIS — D320 Benign neoplasm of cerebral meninges: Secondary | ICD-10-CM | POA: Insufficient documentation

## 2022-08-20 DIAGNOSIS — K219 Gastro-esophageal reflux disease without esophagitis: Secondary | ICD-10-CM | POA: Insufficient documentation

## 2022-08-20 DIAGNOSIS — Z923 Personal history of irradiation: Secondary | ICD-10-CM | POA: Insufficient documentation

## 2022-08-20 DIAGNOSIS — Z79899 Other long term (current) drug therapy: Secondary | ICD-10-CM | POA: Insufficient documentation

## 2022-08-20 DIAGNOSIS — Z7951 Long term (current) use of inhaled steroids: Secondary | ICD-10-CM | POA: Diagnosis not present

## 2022-08-20 DIAGNOSIS — J45909 Unspecified asthma, uncomplicated: Secondary | ICD-10-CM | POA: Diagnosis not present

## 2022-08-20 LAB — PREGNANCY, URINE: Preg Test, Ur: NEGATIVE

## 2022-08-20 LAB — TSH: TSH: 2.182 u[IU]/mL (ref 0.350–4.500)

## 2022-08-20 LAB — T4, FREE: Free T4: 0.79 ng/dL (ref 0.61–1.12)

## 2022-08-20 MED ORDER — LORAZEPAM 1 MG PO TABS: ORAL_TABLET | ORAL | 0 refills | Status: DC

## 2022-08-20 NOTE — Addendum Note (Signed)
Addended by: Pincus Large on: 08/20/2022 10:05 AM   Modules accepted: Orders

## 2022-08-20 NOTE — Progress Notes (Signed)
MRI orders for [facility] were cancelled due to her insurance not being accepted at that facility. I will enter in new orders for [facility].  Mont Dutton R.T.(R)(T)

## 2022-09-03 NOTE — Progress Notes (Incomplete)
Location/Histology of Brain Tumor:  Meningioma    Mri of orbit/ brain : 07-28-22 IMPRESSION: 1. Postop bifrontal craniotomy for meningioma resection. There is residual tumor along the posterior planum sphenoidale which is slightly larger than on the prior MRI of 01/15/2020. 2. Thickening and enhancement of the optic nerve bilaterally near the orbital apex and optic foramen, left greater than right likely due to meningioma spread. Possible hyperintense signal in the left optic nerve. 3. Negative for acute infarct.     Patient presented with symptoms of:    Past or anticipated interventions, if any, per neurosurgery:      Past or anticipated interventions, if any, per medical oncology: none at this time  Dose of Decadron, if applicable: none  Recent neurologic symptoms, if any:  Seizures: {:18581} Headaches: {:18581} Nausea: {:18581} Dizziness/ataxia: {:18581} Difficulty with hand coordination: {:18581} Focal numbness/weakness: {:18581} Visual deficits/changes: {:18581} Confusion/Memory deficits: {:18581}  Painful bone metastases at present, if any: none  SAFETY ISSUES: Prior radiation? {:18581} Pacemaker/ICD? {:18581} Possible current pregnancy? {:18581} Is the patient on methotrexate? {:18581}  Additional Complaints / other details: ***

## 2022-09-08 ENCOUNTER — Ambulatory Visit (HOSPITAL_COMMUNITY): Payer: No Typology Code available for payment source

## 2022-09-13 ENCOUNTER — Ambulatory Visit
Admission: RE | Admit: 2022-09-13 | Discharge: 2022-09-13 | Disposition: A | Discharge status: Home / Self Care | Payer: No Typology Code available for payment source | Source: Ambulatory Visit | Attending: Radiation Oncology | Admitting: Radiation Oncology

## 2022-09-13 ENCOUNTER — Ambulatory Visit: Payer: Medicaid Other | Admitting: Radiation Oncology

## 2022-09-13 ENCOUNTER — Ambulatory Visit: Payer: No Typology Code available for payment source

## 2022-09-13 ENCOUNTER — Ambulatory Visit: Payer: Medicaid Other

## 2022-09-13 DIAGNOSIS — D329 Benign neoplasm of meninges, unspecified: Secondary | ICD-10-CM

## 2022-09-15 ENCOUNTER — Ambulatory Visit (HOSPITAL_COMMUNITY): Payer: No Typology Code available for payment source

## 2022-09-15 NOTE — Progress Notes (Signed)
Location/Histology of Brain Tumor:  Meningioma    Mri of orbit/ brain : 07-28-22 IMPRESSION: 1. Postop bifrontal craniotomy for meningioma resection. There is residual tumor along the posterior planum sphenoidale which is slightly larger than on the prior MRI of 01/15/2020. 2. Thickening and enhancement of the optic nerve bilaterally near the orbital apex and optic foramen, left greater than right likely due to meningioma spread. Possible hyperintense signal in the left optic nerve. 3. Negative for acute infarct.     Patient presented with symptoms of:    Past or anticipated interventions, if any, per neurosurgery:       Past or anticipated interventions, if any, per medical oncology: none at this time   Dose of Decadron, if applicable: none   Recent neurologic symptoms, if any:  Seizures: none Headaches: yes, but from stress Nausea: yes, sick to stomach often Dizziness/ataxia: none Difficulty with hand coordination: none Focal numbness/weakness: none Visual deficits/changes: none Confusion/Memory deficits: none   Painful bone metastases at present, if any: none   SAFETY ISSUES: Prior radiation? none Pacemaker/ICD? none Possible current pregnancy? none Is the patient on methotrexate? none   Additional Complaints / other details: no major concerns at this time  Vitals:   09/27/22 0825  BP: 121/63  Pulse: 87  Resp: 18  Temp: (!) 97.1 F (36.2 C)  SpO2: 99%

## 2022-09-24 ENCOUNTER — Ambulatory Visit (HOSPITAL_COMMUNITY)
Admission: RE | Admit: 2022-09-24 | Discharge: 2022-09-24 | Disposition: A | Discharge status: Home / Self Care | Payer: Medicaid Other | Source: Ambulatory Visit | Attending: Radiation Oncology | Admitting: Radiation Oncology

## 2022-09-24 DIAGNOSIS — D329 Benign neoplasm of meninges, unspecified: Secondary | ICD-10-CM | POA: Insufficient documentation

## 2022-09-24 MED ORDER — GADOBUTROL 1 MMOL/ML IV SOLN
10.0000 mL | Freq: Once | INTRAVENOUS | Status: AC | PRN
  Administered 2022-09-24: 10 mL via INTRAVENOUS

## 2022-09-24 NOTE — Progress Notes (Signed)
Radiation Oncology         [phone] ________________________________  Follow-up New Visit   Outpatient  Name: Kendra [name] MRN: [id number]  Date: 09/27/2022  DOB: 07/18/1986  [id number], Margaretha Sheffield, MD  Kelton Pillar, MD   REFERRING PHYSICIAN: Kelton Pillar, MD  DIAGNOSIS:    ICD-10-CM   1. Meningioma ([place])  D32.9        Cancer Staging  No matching staging information was found for the patient.  CHIEF COMPLAINT: Here to discuss management of recurrent meningioma  HISTORY OF PRESENT ILLNESS::Kendra [name] is a 37 y.o. female who presents today to review her recent MRI and for further discussion of radiation therapy in management of her recent recurrence of meningioma. To review from her initial consultation, we discussed the rationale behind proceeding with standard fractionated radiation therapy based on the location of the tumor and suspicion for optic nerve involvement. Following our discussion, the patient agreed to return in January for a 3 Tesla MRI and following back up with me at that time to proceed with treatment planning.   Accordingly, the patient presented for an MRI of the brain with and without contrast on 09/24/22 which demonstrated: ***  HPI Initial Consultation 08/20/22 ::Kendra [name] is a 37 y.o. female who is seen as a courtesy of Dr. Kathyrn Sheriff for an opinion concerning radiation therapy as part of management for her suspected recurrent meningioma. On record review, it seems as though the patient was initially found to have an enhancing anterior cranial fossa midline mass consistent with meningioma on an MRI performed back in 2008-2009. MRI's of the brain performed in 2013 and 2017 showed a further increase in size of the mass. Given a continued increase in size over the years, she was referred to neurosurgery and underwent resectioning of the mass with Dr. Kathyrn Sheriff in April 2018. 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 She continued with follow-up imaging  until 2021 and was without evidence of recurrent tumor until recent history (detailed as follows).    The patient presented to the ED on 07/28/22 with the cc of worsening left sided headaches x 3 days. Per encounter notes, the patient also noted being seen by her ophthalmologist in October for vision changes (resolved) who told her that she "had something behind her left eye", however she was unsure exactly what that meant. She was also noted to be unable to have her routine brain MRI this past summer given that she was uninsured at that time. Her ophthalmologist also advised further imaging but she was still uninsured at the time.    MRI of the head and orbits with and without contrast performed in the ED showed slight enlargement of the residual tumor along the posterior planum sphenoidale on 07/28/2022 (s/p bifrontal craniotomy for meningioma resection) when compared to her last brain MRI on 01/15/2020. MRI also showed thickening and enhancement of the optic nerve bilaterally near the orbital apex and optic foramen, left greater than right, likely due to meningioma spread, and possible hyperintense signaling in the left optic nerve. MRI otherwise showed no evidence of acute infarct or other intracranial abnormalities.       MRI of the cervical spine with and w/o contrast also performed in the ED showed no discernible cord signal abnormality, cord lesion, or abnormal enhancements (however this evaluation was limited secondary to a motion artifact).    Neurology was consulted who noted concern for recurrent meningioma based on MRI findings, and advised follow-up with outpatient neurosurgery for further  evaluation.    She was discussed recently at CNS tumor board.  The consensus is that she should be seen in radiation oncology for consideration of salvage radiation therapy given the proximity of her tumor to optic structures.  Patient reports that she has had chronic left ear pain and left upper neck pain  in the region of her jaw ever since her surgery for her meningioma.  She reports chronic headaches.  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  PREVIOUS RADIATION THERAPY: {EXAM; YES/NO:19492::"No"}  PAST MEDICAL HISTORY:  has a past medical history of Anxiety, Asthma, Back pain, Bilateral swelling of feet and ankles, Constipation, Convulsions/seizures ([facility]) (07/27/2016), Depression, Epilepsy ([place]), GERD (gastroesophageal reflux disease), H/O: C-section, Headache, Joint pain, Lipoma, Lipoma of LUQ abdomen, s/p removal 06/27/2012 (05/24/2012), Meningioma ([place]) (07/27/2016), Multiple food allergies, Other fatigue, Shortness of breath, and Shortness of breath on exertion.    PAST SURGICAL HISTORY: Past Surgical History:  Procedure Laterality Date   ABDOMINAL SURGERY  98/33/8250   APPLICATION OF CRANIAL NAVIGATION N/A 12/10/2016   Procedure: APPLICATION OF CRANIAL NAVIGATION;  Surgeon: Consuella Lose, MD;  Location: [facility];  Service: Neurosurgery;  Laterality: N/A;   BRAIN MENINGIOMA EXCISION  07/27/2016   BRAIN SURGERY     BREAST SURGERY  12/30/11   breast reduction   CESAREAN SECTION  09/09/2010   [place]   CESAREAN SECTION N/A 01/13/2015   Procedure: CESAREAN SECTION;  Surgeon: Janyth Pupa, DO;  Location: [facility];  Service: Obstetrics;  Laterality: N/A;  EDD 01/18/15 would like [place] dressing   CHOLECYSTECTOMY N/A 11/23/2016   Procedure: LAPAROSCOPIC CHOLECYSTECTOMY WITH INTRAOPERATIVE CHOLANGIOGRAM;  Surgeon: Donnie Mesa, MD;  Location: [facility];  Service: General;  Laterality: N/A;   CRANIOTOMY N/A 12/10/2016   Procedure: BICORONAL CRANIOTOMY FOR RESECTION OF TUMOR;  Surgeon: Consuella Lose, MD;  Location: [facility];  Service: Neurosurgery;  Laterality: N/A;  BICORONAL CRANIOTOMY FOR RESECTION OF TUMOR    INTRAPERITONEAL PORT PLACEMENT  12/10/2016   LIPOMA EXCISION  06/27/2012   LUQ   LYMPH NODE DISSECTION  2013   Abdominal    FAMILY HISTORY: family history includes Cancer in her maternal aunt, maternal  grandfather, and maternal grandmother; Deep vein thrombosis in her maternal grandfather; Diabetes in her paternal grandfather; Healthy in her father; Heart disease in her maternal uncle; Kidney disease in her mother; Liver cancer in her maternal uncle; Stroke in her maternal uncle.  SOCIAL HISTORY:  reports that she quit smoking about 7 years ago. Her smoking use included cigars. She started smoking about 23 years ago. She has never used smokeless tobacco. She reports current drug use. Drug: Marijuana. She reports that she does not drink alcohol.  ALLERGIES: Amoxicillin, Asa buff (mag [buffered aspirin], Erythromycin, Iodine, Peanut-containing drug products, Shellfish allergy, Doxycycline, Other, and Ibuprofen  MEDICATIONS:  Current Outpatient Medications  Medication Sig Dispense Refill   acetaminophen (TYLENOL) 500 MG tablet Take 1,000 mg by mouth every 6 (six) hours as needed (for pain/headaches.).     albuterol (VENTOLIN HFA) 108 (90 Base) MCG/ACT inhaler Inhale 2 puffs into the lungs every 6 (six) hours as needed for wheezing or shortness of breath. 1 each 0   budesonide-formoterol (SYMBICORT) 160-4.5 MCG/ACT inhaler Inhale 2 puffs into the lungs 2 (two) times daily. 1 each 0   cetirizine (ZYRTEC) 10 MG tablet Take 10 mg by mouth daily.     diphenhydrAMINE (BENADRYL) 25 mg capsule Take 1 capsule (25 mg total) by mouth every 6 (six) hours as needed. (Patient taking differently:  Take 25 mg by mouth every 6 (six) hours as needed for allergies.) 30 capsule 0   doxycycline (VIBRAMYCIN) 100 MG capsule Take 100 mg by mouth daily.     EPINEPHrine 0.3 mg/0.3 mL IJ SOAJ injection Inject 0.3 mg into the skin once.     fluticasone (FLONASE) 50 MCG/ACT nasal spray Place 1 spray into both nostrils daily. 16 g 0   gabapentin (NEURONTIN) 100 MG capsule TAKE 1 CAPSULE(100 MG) BY MOUTH AT BEDTIME 30 capsule 0   lamoTRIgine (LAMICTAL) 100 MG tablet TAKE 2 TABLETS(200 MG) BY MOUTH TWICE DAILY 120 tablet 2    LINZESS 290 MCG CAPS capsule Take 290 mcg by mouth daily as needed (constipation).   3   LORazepam (ATIVAN) 1 MG tablet Take 1 tab PO 20 minutes before MRI or radiation procedures, prn anxiety, claustrophobia. 7 tablet 0   pantoprazole (PROTONIX) 40 MG tablet Take 1 tablet (40 mg) twice daily for two weeks. Then take 1 tablet (40 mg) daily. 90 tablet 3   Polyethylene Glycol 3350 (MIRALAX PO) Take by mouth.     psyllium (METAMUCIL) 58.6 % powder Take 1 packet by mouth as needed.     Tretinoin Microsphere (RETIN-A MICRO PUMP) 0.08 % GEL Apply 1 application topically at bedtime. Apply one application topically to face each night as needed.     Vitamin D, Ergocalciferol, (DRISDOL) 1.25 MG (50000 UNIT) CAPS capsule Take 1 capsule (50,000 Units total) by mouth every 7 (seven) days. 4 capsule 0   zinc gluconate 50 MG tablet Take 50 mg by mouth daily.     No current facility-administered medications for this encounter.    REVIEW OF SYSTEMS:  Notable for that above.   PHYSICAL EXAM:  vitals were not taken for this visit.   General: Alert and oriented, in no acute distress *** HEENT: Head is normocephalic. Extraocular movements are intact. Oropharynx is clear. Neck: Neck is supple, no palpable cervical or supraclavicular lymphadenopathy. Heart: Regular in rate and rhythm with no murmurs, rubs, or gallops. Chest: Clear to auscultation bilaterally, with no rhonchi, wheezes, or rales. Abdomen: Soft, nontender, nondistended, with no rigidity or guarding. Extremities: No cyanosis or edema. Lymphatics: see Neck Exam Skin: No concerning lesions. Musculoskeletal: symmetric strength and muscle tone throughout. Neurologic: Cranial nerves II through XII are grossly intact. No obvious focalities. Speech is fluent. Coordination is intact. Psychiatric: Judgment and insight are intact. Affect is appropriate.   ECOG = ***  0 - Asymptomatic (Fully active, able to carry on all predisease activities without  restriction)  1 - Symptomatic but completely ambulatory (Restricted in physically strenuous activity but ambulatory and able to carry out work of a light or sedentary nature. For example, light housework, office work)  2 - Symptomatic, <50% in bed during the day (Ambulatory and capable of all self care but unable to carry out any work activities. Up and about more than 50% of waking hours)  3 - Symptomatic, >50% in bed, but not bedbound (Capable of only limited self-care, confined to bed or chair 50% or more of waking hours)  4 - Bedbound (Completely disabled. Cannot carry on any self-care. Totally confined to bed or chair)  5 - Death   Eustace Pen MM, Creech RH, Tormey DC, et al. [phone]). "Toxicity and response criteria of the [facility]". [place] Oncol. 5 (6): 649-55   LABORATORY DATA:  Lab Results  Component Value Date   WBC 6.3 07/28/2022   HGB 13.8 07/28/2022  HCT 43.0 07/28/2022   MCV 94.3 07/28/2022   PLT 463 (H) 07/28/2022   CMP     Component Value Date/Time   NA 138 07/28/2022 1519   NA 138 11/19/2020 1049   K 3.8 07/28/2022 1519   CL 100 07/28/2022 1519   CO2 24 07/28/2022 1519   GLUCOSE 104 (H) 07/28/2022 1519   BUN 11 07/28/2022 1519   BUN 10 11/19/2020 1049   CREATININE 0.87 07/28/2022 1519   CALCIUM 9.2 07/28/2022 1519   PROT 7.3 11/19/2020 1049   ALBUMIN 4.5 11/19/2020 1049   AST 12 11/19/2020 1049   ALT 14 11/19/2020 1049   ALKPHOS 101 11/19/2020 1049   BILITOT 0.3 11/19/2020 1049   GFRNONAA >60 07/28/2022 1519   GFRAA 115 09/19/2019 1407         RADIOGRAPHY: No results found.    IMPRESSION/PLAN:***    On date of service, in total, I spent *** minutes on this encounter. Patient was seen in person.   __________________________________________   Eppie Gibson, MD  This document serves as a record of services personally performed by Eppie Gibson, MD. It was created on her behalf by Roney Mans, a trained medical scribe.  The creation of this record is based on the scribe's personal observations and the provider's statements to them. This document has been checked and approved by the attending provider.

## 2022-09-27 ENCOUNTER — Inpatient Hospital Stay: Payer: Medicaid Other

## 2022-09-27 ENCOUNTER — Ambulatory Visit: Payer: Medicaid Other

## 2022-09-27 ENCOUNTER — Other Ambulatory Visit: Payer: Self-pay | Admitting: Radiation Therapy

## 2022-09-27 ENCOUNTER — Inpatient Hospital Stay (HOSPITAL_BASED_OUTPATIENT_CLINIC_OR_DEPARTMENT_OTHER): Payer: Medicaid Other | Admitting: Genetic Counselor

## 2022-09-27 ENCOUNTER — Ambulatory Visit
Admission: RE | Admit: 2022-09-27 | Discharge: 2022-09-27 | Disposition: A | Discharge status: Home / Self Care | Payer: Medicaid Other | Source: Ambulatory Visit | Attending: Radiation Oncology | Admitting: Radiation Oncology

## 2022-09-27 ENCOUNTER — Other Ambulatory Visit: Payer: Self-pay

## 2022-09-27 ENCOUNTER — Other Ambulatory Visit: Payer: Self-pay | Admitting: Genetic Counselor

## 2022-09-27 ENCOUNTER — Encounter: Payer: Self-pay | Admitting: Radiation Oncology

## 2022-09-27 ENCOUNTER — Encounter: Payer: Self-pay | Admitting: Genetic Counselor

## 2022-09-27 VITALS — BP 121/63 | HR 87 | Temp 97.1°F | Resp 18 | Ht 60.0 in | Wt 249.1 lb

## 2022-09-27 DIAGNOSIS — D329 Benign neoplasm of meninges, unspecified: Secondary | ICD-10-CM

## 2022-09-27 DIAGNOSIS — R0602 Shortness of breath: Secondary | ICD-10-CM | POA: Insufficient documentation

## 2022-09-27 DIAGNOSIS — D320 Benign neoplasm of cerebral meninges: Secondary | ICD-10-CM

## 2022-09-27 DIAGNOSIS — Z803 Family history of malignant neoplasm of breast: Secondary | ICD-10-CM | POA: Insufficient documentation

## 2022-09-27 DIAGNOSIS — Z79899 Other long term (current) drug therapy: Secondary | ICD-10-CM | POA: Insufficient documentation

## 2022-09-27 DIAGNOSIS — R5383 Other fatigue: Secondary | ICD-10-CM | POA: Insufficient documentation

## 2022-09-27 DIAGNOSIS — Z87891 Personal history of nicotine dependence: Secondary | ICD-10-CM | POA: Insufficient documentation

## 2022-09-27 DIAGNOSIS — Z800 Family history of malignant neoplasm of digestive organs: Secondary | ICD-10-CM | POA: Insufficient documentation

## 2022-09-27 DIAGNOSIS — Z809 Family history of malignant neoplasm, unspecified: Secondary | ICD-10-CM | POA: Insufficient documentation

## 2022-09-27 DIAGNOSIS — Z7951 Long term (current) use of inhaled steroids: Secondary | ICD-10-CM | POA: Insufficient documentation

## 2022-09-27 DIAGNOSIS — G40909 Epilepsy, unspecified, not intractable, without status epilepticus: Secondary | ICD-10-CM | POA: Insufficient documentation

## 2022-09-27 DIAGNOSIS — K219 Gastro-esophageal reflux disease without esophagitis: Secondary | ICD-10-CM | POA: Insufficient documentation

## 2022-09-27 DIAGNOSIS — K5900 Constipation, unspecified: Secondary | ICD-10-CM | POA: Insufficient documentation

## 2022-09-27 LAB — PREGNANCY, URINE: Preg Test, Ur: NEGATIVE

## 2022-09-27 LAB — GENETIC SCREENING ORDER

## 2022-09-27 NOTE — Progress Notes (Signed)
REFERRING PROVIDER: Eppie Gibson, MD [address],  [place] [address]  PRIMARY PROVIDER:  Kelton Pillar, MD  PRIMARY REASON FOR VISIT:  1. Family history of breast cancer   2. Family history of colon cancer   3. Meningioma ([place])      HISTORY OF PRESENT ILLNESS:   Ms. Thorp, a 37 y.o. female, was seen for a [place] cancer genetics consultation at the request of Dr. Isidore Moos due to a family history of cancer and a personal history of meningioma.  Ms. Whitelock presents to clinic today to discuss the possibility of a hereditary predisposition to cancer, genetic testing, and to further clarify her future cancer risks, as well as potential cancer risks for family members.   Ms. Chauca is a 37 y.o. female with no personal history of cancer.  Ms. Adel reports being diagnosed with a meningioma at age 33.  This was followed until age 11 when she developed epilepsy and it needed to be removed.  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  CANCER HISTORY:  Oncology History   No history exists.     RISK FACTORS:  Menarche was at age 37.  First live birth at age 7.  OCP use for approximately 1 years.  Ovaries intact: yes.  Hysterectomy: no.  Menopausal status: premenopausal.  HRT use: 0 years. Colonoscopy: yes; normal. Mammogram within the last year: no. Number of breast biopsies: 0. Up to date with pelvic exams: yes. Any excessive radiation exposure in the past: no  Past Medical History:  Diagnosis Date   Anxiety    Asthma    Back pain    Bilateral swelling of feet and ankles    Constipation    Convulsions/seizures ([place]) 07/27/2016   most recent 10/17/17   Depression    Epilepsy ([place])    Family history of breast cancer    Family history of colon cancer    GERD (gastroesophageal reflux disease)    H/O: C-section    Headache    Joint pain    Lipoma    Lipoma of LUQ abdomen, s/p removal  06/27/2012 05/24/2012   Meningioma ([place]) 07/27/2016   Anterior fossa   Multiple food allergies    Other fatigue    Shortness of breath    Shortness of breath on exertion     Past Surgical History:  Procedure Laterality Date   ABDOMINAL SURGERY  69/62/9528   APPLICATION OF CRANIAL NAVIGATION N/A 12/10/2016   Procedure: APPLICATION OF CRANIAL NAVIGATION;  Surgeon: Consuella Lose, MD;  Location: [facility];  Service: Neurosurgery;  Laterality: N/A;   BRAIN MENINGIOMA EXCISION  07/27/2016   BRAIN SURGERY     BREAST SURGERY  12/30/11   breast reduction   CESAREAN SECTION  09/09/2010   [facility]   CESAREAN SECTION N/A 01/13/2015   Procedure: CESAREAN SECTION;  Surgeon: Janyth Pupa, DO;  Location: [facility];  Service: Obstetrics;  Laterality: N/A;  EDD 01/18/15 would like [place] dressing   CHOLECYSTECTOMY N/A 11/23/2016   Procedure: LAPAROSCOPIC CHOLECYSTECTOMY WITH INTRAOPERATIVE CHOLANGIOGRAM;  Surgeon: Donnie Mesa, MD;  Location: [facility];  Service: General;  Laterality: N/A;   CRANIOTOMY N/A 12/10/2016   Procedure: BICORONAL CRANIOTOMY FOR RESECTION OF TUMOR;  Surgeon: Consuella Lose, MD;  Location: [facility];  Service: Neurosurgery;  Laterality: N/A;  BICORONAL CRANIOTOMY FOR RESECTION OF TUMOR    INTRAPERITONEAL PORT PLACEMENT  12/10/2016   LIPOMA EXCISION  06/27/2012   LUQ   LYMPH NODE DISSECTION  2013   Abdominal    Social History   Socioeconomic History   Marital status: Single    Spouse name: Jelani   Number of children: 2   Years of education: 12   Highest education level: Not on file  Occupational History   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  Tobacco Use   Smoking status: Former    Types: Cigars    Start date: 08/31/1999    Quit date: 08/30/2015    Years since quitting: 7.0   Smokeless tobacco: Never  Vaping Use   Vaping Use: Never used  Substance and Sexual Activity   Alcohol use: No   Drug use: Yes    Types: Marijuana   Sexual activity: Yes    Partners: Male    Birth  control/protection: None  Other Topics Concern   Not on file  Social History Narrative   Lives at home w/ her 2 children   Right-handed   Caffeine: couple of cups per month   Social Determinants of Health   Financial Resource Strain: Not on file  Food Insecurity: No Food Insecurity (09/27/2022)   Hunger Vital Sign    Worried About Running Out of Food in the Last Year: Never true    Ran Out of Food in the Last Year: Never true  Transportation Needs: No Transportation Needs (09/27/2022)   PRAPARE - Hydrologist (Medical): No    Lack of Transportation (Non-Medical): No  Physical Activity: Not on file  Stress: Not on file  Social Connections: Not on file     FAMILY HISTORY:  We obtained a detailed, 4-generation family history.  Significant diagnoses are listed below: Family History  Problem Relation Age of Onset   Kidney disease Mother        kidney stones   Healthy Father    Breast cancer Maternal Aunt        dx under 67   Heart disease Maternal Uncle    Stroke Maternal Uncle    Colon cancer Maternal Uncle        dx over 66   Breast cancer Maternal Grandmother        dx over 39   Deep vein thrombosis Maternal Grandfather        in throat   Diabetes Paternal Grandfather      The patient has two children who are cancer free.  She has a maternal half brother and sister and two paternal half brothers who are all cancer free and do not have meningiomas.  The patient's parents are living.  The patient's mother has a brother with colon cancer over 1 and a sister with breast cancer under 27.  There are several other siblings that do not have cancer.  Both maternal grandparents are deceased.  The grandmother had breast cancer over 22.  The patient's father is living.  He has five siblings who are cancer free.  His parents are deceased.   Ms. Twitty is unaware of previous family history of genetic testing for hereditary cancer risks. Patient's  maternal ancestors are of African American descent, and paternal ancestors are of African American descent. There is no reported Ashkenazi Jewish ancestry. There is no known consanguinity.  GENETIC COUNSELING ASSESSMENT: Ms. Biswell is a 37 y.o. female with a family history of cancer and a personal history of meningioma which is somewhat suggestive of a hereditary cancer syndrome and predisposition to cancer given the  young age of onset of breast cancer in the family. We, therefore, discussed and recommended the following at today's visit.   DISCUSSION: We discussed that, in general, most cancer is not inherited in families, but instead is sporadic or familial. Sporadic cancers occur by chance and typically happen at older ages (>50 years) as this type of cancer is caused by genetic changes acquired during an individual's lifetime. Some families have more cancers than would be expected by chance; however, the ages or types of cancer are not consistent with a known genetic mutation or known genetic mutations have been ruled out. This type of familial cancer is thought to be due to a combination of multiple genetic, environmental, hormonal, and lifestyle factors. While this combination of factors likely increases the risk of cancer, the exact source of this risk is not currently identifiable or testable.  We discussed that some cases of meningiomas are is hereditary.  When an individual has been diagnosed with more than one meningioma there can be concern with hereditary syndromes.  There are several genes associated with hereditary meningiomas.  These include MEN1, PTCH1, PTEN, SMARCE1 and NF2.  Additionally, she has a family history of breast cancer.  When an individual in the family is diagnosed with breast cancer under age 22, and especially if there is a family history of breast cancer, there can be concern for a hereditary breast cancer syndrome.  Based on her maternal aunt being diagnosed young, as well as  her grandmother also having breast cancer, Ms. Rohrer meets criteria for genetic testing based on hereditary breast cancer syndromes.  We discussed that testing is beneficial for several reasons including knowing how to follow individuals after completing their treatment, and understand if other family members could be at risk for cancer and allow them to undergo genetic testing.   We reviewed the characteristics, features and inheritance patterns of hereditary cancer syndromes. We also discussed genetic testing, including the appropriate family members to test, the process of testing, insurance coverage and turn-around-time for results. We discussed the implications of a negative, positive, carrier and/or variant of uncertain significant result. Ms. Mcgaughey  was offered a common hereditary cancer panel (47 genes) and an expanded pan-cancer panel (77 genes). Ms. Tidd was informed of the benefits and limitations of each panel, including that expanded pan-cancer panels contain genes that do not have clear management guidelines at this point in time.  We also discussed that as the number of genes included on a panel increases, the chances of variants of uncertain significance increases. Ms. Barrington decided to pursue genetic testing for the CancerNext-Expanded+RNAinsight gene panel.   The CancerNext-Expanded gene panel offered by [facility] and includes sequencing and rearrangement analysis for the following 77 genes: AIP, ALK, APC*, ATM*, AXIN2, BAP1, BARD1, BLM, BMPR1A, BRCA1*, BRCA2*, BRIP1*, CDC73, CDH1*, CDK4, CDKN1B, CDKN2A, CHEK2*, CTNNA1, DICER1, FANCC, FH, FLCN, GALNT12, KIF1B, LZTR1, MAX, MEN1, MET, MLH1*, MSH2*, MSH3, MSH6*, MUTYH*, NBN, NF1*, NF2, NTHL1, PALB2*, PHOX2B, PMS2*, POT1, PRKAR1A, PTCH1, PTEN*, RAD51C*, RAD51D*, RB1, RECQL, RET, SDHA, SDHAF2, SDHB, SDHC, SDHD, SMAD4, SMARCA4, SMARCB1, SMARCE1, STK11, SUFU, TMEM127, TP53*, TSC1, TSC2, VHL and XRCC2 (sequencing and deletion/duplication);  EGFR, EGLN1, HOXB13, KIT, MITF, PDGFRA, POLD1, and POLE (sequencing only); EPCAM and GREM1 (deletion/duplication only). DNA and RNA analyses performed for * genes.   Based on Ms. Lepore's family history of cancer, she meets medical criteria for genetic testing. Despite that she meets criteria, she may still have an out of pocket cost. We discussed that if  her out of pocket cost for testing is over $100, the laboratory will call and confirm whether she wants to proceed with testing.  If the out of pocket cost of testing is less than $100 she will be billed by the genetic testing laboratory.   We discussed that some people do not want to undergo genetic testing due to fear of genetic discrimination.  The Genetic Information Nondiscrimination Act (GINA) was signed into federal law in 2008. GINA prohibits health insurers and most employers from discriminating against individuals based on genetic information (including the results of genetic tests and family history information). According to GINA, health insurance companies cannot consider genetic information to be a preexisting condition, nor can they use it to make decisions regarding coverage or rates. GINA also makes it illegal for most employers to use genetic information in making decisions about hiring, firing, promotion, or terms of employment. It is important to note that GINA does not offer protections for life insurance, disability insurance, or long-term care insurance. GINA does not apply to those in the [employer], those who work for companies with less than 15 employees, and new life insurance or long-term disability insurance policies.  Health status due to a cancer diagnosis is not protected under GINA. More information about GINA can be found by visiting [email or web address].   PLAN: After considering the risks, benefits, and limitations, Ms. Rys provided informed consent to pursue genetic testing and the blood sample was sent to [facility] for analysis of the CancerNext-Expanded+RNAinsight. Results should be available within approximately 2-3 weeks' time, at which point they will be disclosed by telephone to Ms. Ferrentino, as will any additional recommendations warranted by these results. Ms. Dea will receive a summary of her genetic counseling visit and a copy of her results once available. This information will also be available in Epic.   Lastly, we encouraged Ms. Bulls to remain in contact with cancer genetics annually so that we can continuously update the family history and inform her of any changes in cancer genetics and testing that may be of benefit for this family.   Ms. Bensman questions were answered to her satisfaction today. Our contact information was provided should additional questions or concerns arise. Thank you for the referral and allowing [place] to share in the care of your patient.   Beautifull Cisar P. Florene Glen, [place], [facility] Licensed, Insurance risk surveyor [email or web address] phone: [phone]  The patient was seen for a total of 30 minutes in face-to-face genetic counseling.  The patient brought her partner. Drs. Michell Heinrich, and/or [place] were available for questions, if needed..    _______________________________________________________________________ For Office Staff:  Number of people involved in session: 2 Was an Intern/ student involved with case: yes, Mechele Claude

## 2022-09-28 ENCOUNTER — Encounter: Payer: Self-pay | Admitting: Radiation Oncology

## 2022-10-05 ENCOUNTER — Telehealth: Payer: Self-pay | Admitting: Genetic Counselor

## 2022-10-05 ENCOUNTER — Encounter: Payer: Self-pay | Admitting: Genetic Counselor

## 2022-10-05 DIAGNOSIS — Z1379 Encounter for other screening for genetic and chromosomal anomalies: Secondary | ICD-10-CM | POA: Insufficient documentation

## 2022-10-05 NOTE — Telephone Encounter (Signed)
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  This does not explain her history of meningiomas, but may help explain some of the cancer in her family.  She was negative for mutations in NF2.  We set up another appointment to come in to discuss more in depth the consequences of BRIP1.  She will talk with her mother about obtaining more information about the maternal family history.

## 2022-10-15 DIAGNOSIS — D329 Benign neoplasm of meninges, unspecified: Secondary | ICD-10-CM | POA: Diagnosis present

## 2022-10-19 ENCOUNTER — Other Ambulatory Visit: Payer: Self-pay | Admitting: Radiation Oncology

## 2022-10-19 ENCOUNTER — Other Ambulatory Visit: Payer: Self-pay

## 2022-10-19 ENCOUNTER — Ambulatory Visit
Admission: RE | Admit: 2022-10-19 | Discharge: 2022-10-19 | Disposition: A | Discharge status: Home / Self Care | Payer: Medicaid Other | Source: Ambulatory Visit | Attending: Radiation Oncology | Admitting: Radiation Oncology

## 2022-10-19 DIAGNOSIS — D329 Benign neoplasm of meninges, unspecified: Secondary | ICD-10-CM

## 2022-10-19 LAB — T4, FREE: Free T4: 0.71 ng/dL (ref 0.61–1.12)

## 2022-10-20 ENCOUNTER — Other Ambulatory Visit: Payer: Self-pay

## 2022-10-20 ENCOUNTER — Ambulatory Visit
Admission: RE | Admit: 2022-10-20 | Discharge: 2022-10-20 | Disposition: A | Discharge status: Home / Self Care | Payer: Medicaid Other | Source: Ambulatory Visit | Attending: Radiation Oncology | Admitting: Radiation Oncology

## 2022-10-20 DIAGNOSIS — D329 Benign neoplasm of meninges, unspecified: Secondary | ICD-10-CM | POA: Diagnosis not present

## 2022-10-20 LAB — ACTH: C206 ACTH: 27.5 pg/mL (ref 7.2–63.3)

## 2022-10-20 LAB — RAD ONC ARIA SESSION SUMMARY
Course Elapsed Days: 0
Plan Fractions Treated to Date: 1
Plan Prescribed Dose Per Fraction: 1.8 Gy
Plan Total Fractions Prescribed: 29
Plan Total Prescribed Dose: 52.2 Gy
Reference Point Dosage Given to Date: 1.8 Gy
Reference Point Session Dosage Given: 1.8 Gy
Session Number: 1

## 2022-10-20 LAB — FOLLICLE STIMULATING HORMONE: FSH: 3 m[IU]/mL

## 2022-10-20 LAB — GROWTH HORMONE: Growth Hormone: 0.4 ng/mL (ref 0.0–10.0)

## 2022-10-20 LAB — LUTEINIZING HORMONE: LH: 7.9 m[IU]/mL

## 2022-10-20 LAB — TSH: TSH: 1.913 u[IU]/mL (ref 0.350–4.500)

## 2022-10-21 ENCOUNTER — Other Ambulatory Visit: Payer: Self-pay

## 2022-10-21 ENCOUNTER — Encounter: Payer: No Typology Code available for payment source | Admitting: Genetic Counselor

## 2022-10-21 ENCOUNTER — Ambulatory Visit
Admission: RE | Admit: 2022-10-21 | Discharge: 2022-10-21 | Disposition: A | Discharge status: Home / Self Care | Payer: Medicaid Other | Source: Ambulatory Visit | Attending: Radiation Oncology | Admitting: Radiation Oncology

## 2022-10-21 DIAGNOSIS — D329 Benign neoplasm of meninges, unspecified: Secondary | ICD-10-CM | POA: Diagnosis not present

## 2022-10-21 LAB — RAD ONC ARIA SESSION SUMMARY
Course Elapsed Days: 1
Course Elapsed Days: 1
Plan Fractions Treated to Date: 2
Plan Fractions Treated to Date: 2
Plan Prescribed Dose Per Fraction: 1.8 Gy
Plan Prescribed Dose Per Fraction: 1.8 Gy
Plan Total Fractions Prescribed: 29
Plan Total Fractions Prescribed: 29
Plan Total Prescribed Dose: 52.2 Gy
Plan Total Prescribed Dose: 52.2 Gy
Reference Point Dosage Given to Date: 2.6591 Gy
Reference Point Dosage Given to Date: 3.6 Gy
Reference Point Session Dosage Given: 0.8591 Gy
Reference Point Session Dosage Given: 0.9409 Gy
Session Number: 2
Session Number: 3

## 2022-10-22 ENCOUNTER — Other Ambulatory Visit: Payer: Self-pay

## 2022-10-22 ENCOUNTER — Ambulatory Visit
Admission: RE | Admit: 2022-10-22 | Discharge: 2022-10-22 | Disposition: A | Discharge status: Home / Self Care | Payer: Medicaid Other | Source: Ambulatory Visit | Attending: Radiation Oncology | Admitting: Radiation Oncology

## 2022-10-22 DIAGNOSIS — D329 Benign neoplasm of meninges, unspecified: Secondary | ICD-10-CM | POA: Diagnosis not present

## 2022-10-22 LAB — RAD ONC ARIA SESSION SUMMARY
Course Elapsed Days: 2
Plan Fractions Treated to Date: 3
Plan Prescribed Dose Per Fraction: 1.8 Gy
Plan Total Fractions Prescribed: 29
Plan Total Prescribed Dose: 52.2 Gy
Reference Point Dosage Given to Date: 5.4 Gy
Reference Point Session Dosage Given: 1.8 Gy
Session Number: 4

## 2022-10-25 ENCOUNTER — Ambulatory Visit
Admission: RE | Admit: 2022-10-25 | Discharge: 2022-10-25 | Disposition: A | Discharge status: Home / Self Care | Payer: Medicaid Other | Source: Ambulatory Visit | Attending: Radiation Oncology

## 2022-10-25 ENCOUNTER — Ambulatory Visit
Admission: RE | Admit: 2022-10-25 | Discharge: 2022-10-25 | Disposition: A | Discharge status: Home / Self Care | Payer: Medicaid Other | Source: Ambulatory Visit | Attending: Radiation Oncology | Admitting: Radiation Oncology

## 2022-10-25 ENCOUNTER — Other Ambulatory Visit: Payer: Self-pay

## 2022-10-25 ENCOUNTER — Other Ambulatory Visit: Payer: Self-pay | Admitting: Radiation Oncology

## 2022-10-25 DIAGNOSIS — D329 Benign neoplasm of meninges, unspecified: Secondary | ICD-10-CM

## 2022-10-25 LAB — RAD ONC ARIA SESSION SUMMARY
Course Elapsed Days: 5
Plan Fractions Treated to Date: 4
Plan Prescribed Dose Per Fraction: 1.8 Gy
Plan Total Fractions Prescribed: 29
Plan Total Prescribed Dose: 52.2 Gy
Reference Point Dosage Given to Date: 7.2 Gy
Reference Point Session Dosage Given: 1.8 Gy
Session Number: 5

## 2022-10-25 LAB — PREGNANCY, URINE: Preg Test, Ur: NEGATIVE

## 2022-10-25 MED ORDER — LORAZEPAM 1 MG PO TABS: ORAL_TABLET | ORAL | 0 refills | Status: AC

## 2022-10-25 MED ORDER — PROMETHAZINE HCL 25 MG PO TABS: 25.0000 mg | ORAL_TABLET | Freq: Four times a day (QID) | ORAL | 1 refills | Status: DC | PRN

## 2022-10-25 MED ORDER — ONDANSETRON 8 MG PO TBDP: 8.0000 mg | ORAL_TABLET | Freq: Three times a day (TID) | ORAL | 1 refills | Status: DC | PRN

## 2022-10-26 ENCOUNTER — Other Ambulatory Visit: Payer: Self-pay

## 2022-10-26 ENCOUNTER — Ambulatory Visit
Admission: RE | Admit: 2022-10-26 | Discharge: 2022-10-26 | Disposition: A | Discharge status: Home / Self Care | Payer: Medicaid Other | Source: Ambulatory Visit | Attending: Radiation Oncology | Admitting: Radiation Oncology

## 2022-10-26 DIAGNOSIS — D329 Benign neoplasm of meninges, unspecified: Secondary | ICD-10-CM | POA: Diagnosis not present

## 2022-10-26 LAB — RAD ONC ARIA SESSION SUMMARY
Course Elapsed Days: 6
Plan Fractions Treated to Date: 5
Plan Prescribed Dose Per Fraction: 1.8 Gy
Plan Total Fractions Prescribed: 29
Plan Total Prescribed Dose: 52.2 Gy
Reference Point Dosage Given to Date: 9 Gy
Reference Point Session Dosage Given: 1.8 Gy
Session Number: 6

## 2022-10-27 ENCOUNTER — Ambulatory Visit
Admission: RE | Admit: 2022-10-27 | Discharge: 2022-10-27 | Disposition: A | Discharge status: Home / Self Care | Payer: Medicaid Other | Source: Ambulatory Visit | Attending: Radiation Oncology | Admitting: Radiation Oncology

## 2022-10-27 ENCOUNTER — Other Ambulatory Visit: Payer: Self-pay

## 2022-10-27 DIAGNOSIS — D329 Benign neoplasm of meninges, unspecified: Secondary | ICD-10-CM | POA: Diagnosis not present

## 2022-10-27 LAB — RAD ONC ARIA SESSION SUMMARY
Course Elapsed Days: 7
Plan Fractions Treated to Date: 6
Plan Prescribed Dose Per Fraction: 1.8 Gy
Plan Total Fractions Prescribed: 29
Plan Total Prescribed Dose: 52.2 Gy
Reference Point Dosage Given to Date: 10.8 Gy
Reference Point Session Dosage Given: 1.8 Gy
Session Number: 7

## 2022-10-28 ENCOUNTER — Ambulatory Visit
Admission: RE | Admit: 2022-10-28 | Discharge: 2022-10-28 | Disposition: A | Discharge status: Home / Self Care | Payer: Medicaid Other | Source: Ambulatory Visit | Attending: Radiation Oncology | Admitting: Radiation Oncology

## 2022-10-28 ENCOUNTER — Other Ambulatory Visit: Payer: Self-pay

## 2022-10-28 ENCOUNTER — Encounter: Payer: Self-pay | Admitting: Genetic Counselor

## 2022-10-28 ENCOUNTER — Inpatient Hospital Stay: Payer: Medicaid Other | Attending: Neurosurgery | Admitting: Genetic Counselor

## 2022-10-28 DIAGNOSIS — Z1589 Genetic susceptibility to other disease: Secondary | ICD-10-CM

## 2022-10-28 DIAGNOSIS — Z1379 Encounter for other screening for genetic and chromosomal anomalies: Secondary | ICD-10-CM

## 2022-10-28 DIAGNOSIS — Z1501 Genetic susceptibility to malignant neoplasm of breast: Secondary | ICD-10-CM

## 2022-10-28 DIAGNOSIS — D329 Benign neoplasm of meninges, unspecified: Secondary | ICD-10-CM | POA: Diagnosis not present

## 2022-10-28 LAB — RAD ONC ARIA SESSION SUMMARY
Course Elapsed Days: 8
Plan Fractions Treated to Date: 7
Plan Prescribed Dose Per Fraction: 1.8 Gy
Plan Total Fractions Prescribed: 29
Plan Total Prescribed Dose: 52.2 Gy
Reference Point Dosage Given to Date: 12.6 Gy
Reference Point Session Dosage Given: 1.8 Gy
Session Number: 8

## 2022-10-28 NOTE — Progress Notes (Signed)
GENETIC TEST RESULTS   Patient Name: Kendra [name] Patient Age: 37 y.o. Encounter Date: 10/28/2022  Referring Provider: Eppie Gibson, MD    Kendra [name] was seen in the [facility] clinic on October 28, 2022 due to a personal and family history of cancer and concern regarding a hereditary predisposition to cancer in the family. Please refer to the prior Genetics clinic note for more information regarding Kendra [name]'s medical and family histories and our assessment at the time.   FAMILY HISTORY:  We obtained a detailed, 4-generation family history.  Significant diagnoses are listed below: Family History  Problem Relation Age of Onset   Kidney disease Mother        kidney stones   Healthy Father    Breast cancer Maternal Aunt        dx under 31   Heart disease Maternal Uncle    Stroke Maternal Uncle    Colon cancer Maternal Uncle        dx over 28   Breast cancer Maternal Grandmother        dx over 92   Deep vein thrombosis Maternal Grandfather        in throat   Diabetes Paternal Grandfather        The patient has two children who are cancer free.  She has a maternal half brother and sister and two paternal half brothers who are all cancer free and do not have meningiomas.  The patient's parents are living.   The patient's mother has a brother with colon cancer over 63 and a sister with breast cancer under 61.  There are several other siblings that do not have cancer.  Both maternal grandparents are deceased.  The grandmother had breast cancer over 39.   The patient's father is living.  He has five siblings who are cancer free.  His parents are deceased.   Kendra [name] is unaware of previous family history of genetic testing for hereditary cancer risks. Patient's maternal ancestors are of African American descent, and paternal ancestors are of African American descent. There is no reported Ashkenazi Jewish ancestry. There is no known consanguinity.  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  The test report has been scanned into EPIC and is located under the Molecular Pathology section of the Results Review tab.  A portion of the result report is included below for reference. Genetic testing reported out on October 04, 2022.    Clinical condition Women who are carriers of a single pathogenic BRIP1 variant have an increased lifetime risk for, generally, adult-onset cancers including, breast, and ovarian cancer. An individual with a BRIP1 pathogenic variant will not necessarily develop cancer in their lifetime, but the risk for cancer is increased over the general population risk.   The cancers associated with BRIP1 mutations include: Ovarian cancer, between 5-15% Breast cancer, potential increase risk in female breast cancer, insufficient data to define.  Management Recommendations:  Gynecological Cancer Screening/Risk Reduction:  It is recommended that women with a BRIP1 mutation consider having a risk-reducing salpingo oophorectomy (RRSO), removal of the ovaries and fallopian tubes, between the ages of 61-50 or once childbearing is completed. Having a RRSO is estimated to reduce the risk of ovarian cancer by up to 96%. There is still a small risk of developing an "ovarian-like" cancer in the lining of the abdomen, called the peritoneum. Salpingectomy is an option for premenopausal  patients with hereditary cancer risk who are not ready for oophorectomy. Completion oophorectomy is recommended as per gene-specific guidelines. Consider continuation of combination OCP or hormonal IUD for continued ovarian cancer risk reduction while ovaries remain in place. Another benefit to having the ovaries removed is the risk reduction for breast cancer. If the ovaries are removed before menopause, the risk of developing breast cancer is reduced. Women undergoing a RRSO should be  aware of the potential risks and benefits of concurrent hysterectomy.  Hormone replacement therapy (HRT) could be considered based on the physician's discretion. HRT is an important consideration for premenopausal patients who do not carry a diagnosis of breast cancer or do not have other contraindications for HRT. If uterus is left in place at time or RRSO, consider options for hormone replacement including: LNG-IUD with oral or transdermal estrogen, combination E/P HRT, or combination OCP's Individuals at risk for developing ovarian cancer may benefit from the use of medication to reduce their risk for cancer. These medications are referred to as chemoprevention. For example, oral contraceptive use has been shown to reduce the risk of ovarian cancer by approximately 60% in BRCA1 population if taken for at least 5 years. This risk reduction remains even after discontinuation of oral contraceptives.   Ovarian cancer screening is an option for women who chose not to have a RRSO or who, as of yet, have not completed their family. Current screening methods for ovarian cancer are neither sensitive nor specific, meaning that often early stage ovarian cancer cannot be diagnosed through this screening.  Screening can also be falsely positive with no cancer present. For this reason, RRSO is recommended over screening. If ovarian cancer screening is recommended by your physician, it could include: CA-125 blood tests Transvaginal ultrasounds Clinical pelvic exams  Kendra [name] sees Dr. Drema Dallas for her GYN care.  Kendra [name] will discuss this information with further with her provider.   Breast Screening/Risk Reduction:  Women: Breast cancer screening includes: Breast awareness beginning at age 69 Monthly self-breast examination beginning at age 44 Manage based on family history.  This information is based on current understanding of the gene and may change in the future.  Implications for Family  Members: Hereditary predisposition to cancer due to pathogenic variants in the BRIP1 gene has autosomal dominant inheritance. This means that an individual with a pathogenic variant has a 50% chance of passing the condition on to his/her offspring. Identification of a pathogenic variant allows for the recognition of at-risk relatives who can pursue testing for the familial variant.  Family members are encouraged to consider genetic testing for this familial pathogenic variant. As there are generally no childhood cancer risks associated with pathogenic variants in the BRIP1 gene, individuals in the family are not recommended to have testing until they reach at least 35 years of age. They may contact our office at [phone] for more information or to schedule an appointment.  Complimentary testing for the familial variant is available for 90 days.  Family members who live outside of the area are encouraged to find a genetic counselor in their area by visiting: [email or web address].  Additionally, individuals with a pathogenic variant in BRIP1 are carriers of Fanconi anemia type J. Fanconi anemia is an autosomal recessive disorder that is characterized by bone marrow failure and variable presentation of anomalies, including short stature, abnormal skin pigmentation, abnormal thumbs, malformations of the skeletal and central nervous systems, and developmental delay ([id number], [id number]). Risks for leukemia and early onset solid tumors  are significantly elevated (PMID: [id number], [id number], WG:[id number]). For there to be a risk of Fanconi anemia in offspring, both parents would each have to have a single pathogenic variant in [place]; in such a case, the risk of having an affected child is 25%.  We encouraged Ms. Stankus to remain in contact with [place] on an annual basis so we can update her personal and family histories, and let her know of advances in cancer genetics that may benefit the family.  Our contact number was provided. Ms. Orren questions were answered to her satisfaction today, and she knows she is welcome to call anytime with additional questions.   Jenipher Havel P. Florene Glen, [place], [facility] Licensed, Insurance risk surveyor [email or web address] phone: [phone]  The patient was seen for a total of 30 minutes in face-to-face genetic counseling. '

## 2022-10-29 ENCOUNTER — Other Ambulatory Visit: Payer: Self-pay

## 2022-10-29 ENCOUNTER — Ambulatory Visit
Admission: RE | Admit: 2022-10-29 | Discharge: 2022-10-29 | Disposition: A | Discharge status: Home / Self Care | Payer: Medicaid Other | Source: Ambulatory Visit | Attending: Radiation Oncology | Admitting: Radiation Oncology

## 2022-10-29 DIAGNOSIS — Z510 Encounter for antineoplastic radiation therapy: Secondary | ICD-10-CM | POA: Diagnosis not present

## 2022-10-29 DIAGNOSIS — D329 Benign neoplasm of meninges, unspecified: Secondary | ICD-10-CM | POA: Insufficient documentation

## 2022-10-29 LAB — RAD ONC ARIA SESSION SUMMARY
Course Elapsed Days: 9
Plan Fractions Treated to Date: 8
Plan Prescribed Dose Per Fraction: 1.8 Gy
Plan Total Fractions Prescribed: 29
Plan Total Prescribed Dose: 52.2 Gy
Reference Point Dosage Given to Date: 14.4 Gy
Reference Point Session Dosage Given: 1.8 Gy
Session Number: 9

## 2022-11-01 ENCOUNTER — Ambulatory Visit
Admission: RE | Admit: 2022-11-01 | Discharge: 2022-11-01 | Disposition: A | Discharge status: Home / Self Care | Payer: Medicaid Other | Source: Ambulatory Visit | Attending: Radiation Oncology | Admitting: Radiation Oncology

## 2022-11-01 ENCOUNTER — Ambulatory Visit
Admission: RE | Admit: 2022-11-01 | Discharge: 2022-11-01 | Disposition: A | Discharge status: Home / Self Care | Payer: Medicaid Other | Source: Ambulatory Visit | Attending: Radiation Oncology

## 2022-11-01 ENCOUNTER — Other Ambulatory Visit: Payer: Self-pay

## 2022-11-01 DIAGNOSIS — Z510 Encounter for antineoplastic radiation therapy: Secondary | ICD-10-CM | POA: Diagnosis not present

## 2022-11-01 LAB — RAD ONC ARIA SESSION SUMMARY
Course Elapsed Days: 12
Plan Fractions Treated to Date: 9
Plan Prescribed Dose Per Fraction: 1.8 Gy
Plan Total Fractions Prescribed: 29
Plan Total Prescribed Dose: 52.2 Gy
Reference Point Dosage Given to Date: 16.2 Gy
Reference Point Session Dosage Given: 1.8 Gy
Session Number: 10

## 2022-11-02 ENCOUNTER — Other Ambulatory Visit: Payer: Self-pay

## 2022-11-02 ENCOUNTER — Ambulatory Visit
Admission: RE | Admit: 2022-11-02 | Discharge: 2022-11-02 | Disposition: A | Discharge status: Home / Self Care | Payer: Medicaid Other | Source: Ambulatory Visit | Attending: Radiation Oncology

## 2022-11-02 DIAGNOSIS — Z510 Encounter for antineoplastic radiation therapy: Secondary | ICD-10-CM | POA: Diagnosis not present

## 2022-11-02 LAB — RAD ONC ARIA SESSION SUMMARY
Course Elapsed Days: 13
Plan Fractions Treated to Date: 10
Plan Prescribed Dose Per Fraction: 1.8 Gy
Plan Total Fractions Prescribed: 29
Plan Total Prescribed Dose: 52.2 Gy
Reference Point Dosage Given to Date: 18 Gy
Reference Point Session Dosage Given: 1.8 Gy
Session Number: 11

## 2022-11-03 ENCOUNTER — Ambulatory Visit
Admission: RE | Admit: 2022-11-03 | Discharge: 2022-11-03 | Disposition: A | Discharge status: Home / Self Care | Payer: Medicaid Other | Source: Ambulatory Visit | Attending: Radiation Oncology

## 2022-11-03 ENCOUNTER — Other Ambulatory Visit: Payer: Self-pay

## 2022-11-03 DIAGNOSIS — Z510 Encounter for antineoplastic radiation therapy: Secondary | ICD-10-CM | POA: Diagnosis not present

## 2022-11-03 LAB — RAD ONC ARIA SESSION SUMMARY
Course Elapsed Days: 14
Plan Fractions Treated to Date: 11
Plan Prescribed Dose Per Fraction: 1.8 Gy
Plan Total Fractions Prescribed: 29
Plan Total Prescribed Dose: 52.2 Gy
Reference Point Dosage Given to Date: 19.8 Gy
Reference Point Session Dosage Given: 1.8 Gy
Session Number: 12

## 2022-11-04 ENCOUNTER — Other Ambulatory Visit: Payer: Self-pay

## 2022-11-04 ENCOUNTER — Ambulatory Visit
Admission: RE | Admit: 2022-11-04 | Discharge: 2022-11-04 | Disposition: A | Discharge status: Home / Self Care | Payer: Medicaid Other | Source: Ambulatory Visit | Attending: Radiation Oncology | Admitting: Radiation Oncology

## 2022-11-04 DIAGNOSIS — Z510 Encounter for antineoplastic radiation therapy: Secondary | ICD-10-CM | POA: Diagnosis not present

## 2022-11-04 LAB — RAD ONC ARIA SESSION SUMMARY
Course Elapsed Days: 15
Plan Fractions Treated to Date: 12
Plan Prescribed Dose Per Fraction: 1.8 Gy
Plan Total Fractions Prescribed: 29
Plan Total Prescribed Dose: 52.2 Gy
Reference Point Dosage Given to Date: 21.6 Gy
Reference Point Session Dosage Given: 1.8 Gy
Session Number: 13

## 2022-11-05 ENCOUNTER — Ambulatory Visit
Admission: RE | Admit: 2022-11-05 | Discharge: 2022-11-05 | Disposition: A | Discharge status: Home / Self Care | Payer: Medicaid Other | Source: Ambulatory Visit | Attending: Radiation Oncology

## 2022-11-05 ENCOUNTER — Other Ambulatory Visit: Payer: Self-pay

## 2022-11-05 DIAGNOSIS — Z510 Encounter for antineoplastic radiation therapy: Secondary | ICD-10-CM | POA: Diagnosis not present

## 2022-11-05 LAB — RAD ONC ARIA SESSION SUMMARY
Course Elapsed Days: 16
Plan Fractions Treated to Date: 13
Plan Prescribed Dose Per Fraction: 1.8 Gy
Plan Total Fractions Prescribed: 29
Plan Total Prescribed Dose: 52.2 Gy
Reference Point Dosage Given to Date: 23.4 Gy
Reference Point Session Dosage Given: 1.8 Gy
Session Number: 14

## 2022-11-08 ENCOUNTER — Ambulatory Visit
Admission: RE | Admit: 2022-11-08 | Discharge: 2022-11-08 | Disposition: A | Discharge status: Home / Self Care | Payer: Medicaid Other | Source: Ambulatory Visit | Attending: Radiation Oncology | Admitting: Radiation Oncology

## 2022-11-08 ENCOUNTER — Other Ambulatory Visit: Payer: Self-pay

## 2022-11-08 DIAGNOSIS — Z510 Encounter for antineoplastic radiation therapy: Secondary | ICD-10-CM | POA: Diagnosis not present

## 2022-11-08 LAB — RAD ONC ARIA SESSION SUMMARY
Course Elapsed Days: 19
Plan Fractions Treated to Date: 14
Plan Prescribed Dose Per Fraction: 1.8 Gy
Plan Total Fractions Prescribed: 29
Plan Total Prescribed Dose: 52.2 Gy
Reference Point Dosage Given to Date: 25.2 Gy
Reference Point Session Dosage Given: 1.8 Gy
Session Number: 15

## 2022-11-09 ENCOUNTER — Other Ambulatory Visit: Payer: Self-pay

## 2022-11-09 ENCOUNTER — Ambulatory Visit
Admission: RE | Admit: 2022-11-09 | Discharge: 2022-11-09 | Disposition: A | Discharge status: Home / Self Care | Payer: Medicaid Other | Source: Ambulatory Visit | Attending: Radiation Oncology

## 2022-11-09 DIAGNOSIS — Z510 Encounter for antineoplastic radiation therapy: Secondary | ICD-10-CM | POA: Diagnosis not present

## 2022-11-09 LAB — RAD ONC ARIA SESSION SUMMARY
Course Elapsed Days: 20
Plan Fractions Treated to Date: 15
Plan Prescribed Dose Per Fraction: 1.8 Gy
Plan Total Fractions Prescribed: 29
Plan Total Prescribed Dose: 52.2 Gy
Reference Point Dosage Given to Date: 27 Gy
Reference Point Session Dosage Given: 1.8 Gy
Session Number: 16

## 2022-11-10 ENCOUNTER — Ambulatory Visit
Admission: RE | Admit: 2022-11-10 | Discharge: 2022-11-10 | Disposition: A | Discharge status: Home / Self Care | Payer: Medicaid Other | Source: Ambulatory Visit | Attending: Radiation Oncology

## 2022-11-10 ENCOUNTER — Other Ambulatory Visit: Payer: Self-pay

## 2022-11-10 DIAGNOSIS — Z510 Encounter for antineoplastic radiation therapy: Secondary | ICD-10-CM | POA: Diagnosis not present

## 2022-11-10 LAB — RAD ONC ARIA SESSION SUMMARY
Course Elapsed Days: 21
Plan Fractions Treated to Date: 16
Plan Prescribed Dose Per Fraction: 1.8 Gy
Plan Total Fractions Prescribed: 29
Plan Total Prescribed Dose: 52.2 Gy
Reference Point Dosage Given to Date: 28.8 Gy
Reference Point Session Dosage Given: 1.8 Gy
Session Number: 17

## 2022-11-11 ENCOUNTER — Other Ambulatory Visit: Payer: Self-pay

## 2022-11-11 ENCOUNTER — Ambulatory Visit
Admission: RE | Admit: 2022-11-11 | Discharge: 2022-11-11 | Disposition: A | Discharge status: Home / Self Care | Payer: Medicaid Other | Source: Ambulatory Visit | Attending: Radiation Oncology | Admitting: Radiation Oncology

## 2022-11-11 DIAGNOSIS — Z510 Encounter for antineoplastic radiation therapy: Secondary | ICD-10-CM | POA: Diagnosis not present

## 2022-11-11 LAB — RAD ONC ARIA SESSION SUMMARY
Course Elapsed Days: 22
Plan Fractions Treated to Date: 17
Plan Prescribed Dose Per Fraction: 1.8 Gy
Plan Total Fractions Prescribed: 29
Plan Total Prescribed Dose: 52.2 Gy
Reference Point Dosage Given to Date: 30.6 Gy
Reference Point Session Dosage Given: 1.8 Gy
Session Number: 18

## 2022-11-12 ENCOUNTER — Other Ambulatory Visit: Payer: Self-pay

## 2022-11-12 ENCOUNTER — Ambulatory Visit
Admission: RE | Admit: 2022-11-12 | Discharge: 2022-11-12 | Disposition: A | Discharge status: Home / Self Care | Payer: Medicaid Other | Source: Ambulatory Visit | Attending: Radiation Oncology | Admitting: Radiation Oncology

## 2022-11-12 DIAGNOSIS — Z510 Encounter for antineoplastic radiation therapy: Secondary | ICD-10-CM | POA: Diagnosis not present

## 2022-11-12 LAB — RAD ONC ARIA SESSION SUMMARY
Course Elapsed Days: 23
Plan Fractions Treated to Date: 18
Plan Prescribed Dose Per Fraction: 1.8 Gy
Plan Total Fractions Prescribed: 29
Plan Total Prescribed Dose: 52.2 Gy
Reference Point Dosage Given to Date: 32.4 Gy
Reference Point Session Dosage Given: 1.8 Gy
Session Number: 19

## 2022-11-15 ENCOUNTER — Ambulatory Visit: Payer: Medicaid Other

## 2022-11-16 ENCOUNTER — Ambulatory Visit
Admission: RE | Admit: 2022-11-16 | Discharge: 2022-11-16 | Disposition: A | Discharge status: Home / Self Care | Payer: Medicaid Other | Source: Ambulatory Visit | Attending: Radiation Oncology | Admitting: Radiation Oncology

## 2022-11-16 ENCOUNTER — Other Ambulatory Visit: Payer: Self-pay

## 2022-11-16 DIAGNOSIS — Z510 Encounter for antineoplastic radiation therapy: Secondary | ICD-10-CM | POA: Diagnosis not present

## 2022-11-16 LAB — RAD ONC ARIA SESSION SUMMARY
Course Elapsed Days: 27
Plan Fractions Treated to Date: 19
Plan Prescribed Dose Per Fraction: 1.8 Gy
Plan Total Fractions Prescribed: 29
Plan Total Prescribed Dose: 52.2 Gy
Reference Point Dosage Given to Date: 34.2 Gy
Reference Point Session Dosage Given: 1.8 Gy
Session Number: 20

## 2022-11-17 ENCOUNTER — Ambulatory Visit
Admission: RE | Admit: 2022-11-17 | Discharge: 2022-11-17 | Disposition: A | Discharge status: Home / Self Care | Payer: Medicaid Other | Source: Ambulatory Visit | Attending: Radiation Oncology | Admitting: Radiation Oncology

## 2022-11-17 ENCOUNTER — Other Ambulatory Visit: Payer: Self-pay

## 2022-11-17 DIAGNOSIS — Z510 Encounter for antineoplastic radiation therapy: Secondary | ICD-10-CM | POA: Diagnosis not present

## 2022-11-17 LAB — RAD ONC ARIA SESSION SUMMARY
Course Elapsed Days: 28
Plan Fractions Treated to Date: 20
Plan Prescribed Dose Per Fraction: 1.8 Gy
Plan Total Fractions Prescribed: 29
Plan Total Prescribed Dose: 52.2 Gy
Reference Point Dosage Given to Date: 36 Gy
Reference Point Session Dosage Given: 1.8 Gy
Session Number: 21

## 2022-11-18 ENCOUNTER — Other Ambulatory Visit: Payer: Self-pay

## 2022-11-18 ENCOUNTER — Ambulatory Visit
Admission: RE | Admit: 2022-11-18 | Discharge: 2022-11-18 | Disposition: A | Discharge status: Home / Self Care | Payer: Medicaid Other | Source: Ambulatory Visit | Attending: Radiation Oncology | Admitting: Radiation Oncology

## 2022-11-18 DIAGNOSIS — Z510 Encounter for antineoplastic radiation therapy: Secondary | ICD-10-CM | POA: Diagnosis not present

## 2022-11-18 LAB — RAD ONC ARIA SESSION SUMMARY
Course Elapsed Days: 29
Plan Fractions Treated to Date: 21
Plan Prescribed Dose Per Fraction: 1.8 Gy
Plan Total Fractions Prescribed: 29
Plan Total Prescribed Dose: 52.2 Gy
Reference Point Dosage Given to Date: 37.8 Gy
Reference Point Session Dosage Given: 1.8 Gy
Session Number: 22

## 2022-11-19 ENCOUNTER — Ambulatory Visit
Admission: RE | Admit: 2022-11-19 | Discharge: 2022-11-19 | Disposition: A | Discharge status: Home / Self Care | Payer: Medicaid Other | Source: Ambulatory Visit | Attending: Radiation Oncology

## 2022-11-19 ENCOUNTER — Other Ambulatory Visit: Payer: Self-pay

## 2022-11-19 DIAGNOSIS — Z510 Encounter for antineoplastic radiation therapy: Secondary | ICD-10-CM | POA: Diagnosis not present

## 2022-11-19 LAB — RAD ONC ARIA SESSION SUMMARY
Course Elapsed Days: 30
Plan Fractions Treated to Date: 22
Plan Prescribed Dose Per Fraction: 1.8 Gy
Plan Total Fractions Prescribed: 29
Plan Total Prescribed Dose: 52.2 Gy
Reference Point Dosage Given to Date: 39.6 Gy
Reference Point Session Dosage Given: 1.8 Gy
Session Number: 23

## 2022-11-22 ENCOUNTER — Ambulatory Visit
Admission: RE | Admit: 2022-11-22 | Discharge: 2022-11-22 | Disposition: A | Discharge status: Home / Self Care | Payer: Medicaid Other | Source: Ambulatory Visit | Attending: Radiation Oncology | Admitting: Radiation Oncology

## 2022-11-22 ENCOUNTER — Other Ambulatory Visit: Payer: Self-pay | Admitting: Radiation Oncology

## 2022-11-22 ENCOUNTER — Other Ambulatory Visit: Payer: Self-pay

## 2022-11-22 ENCOUNTER — Ambulatory Visit
Admission: RE | Admit: 2022-11-22 | Discharge: 2022-11-22 | Disposition: A | Discharge status: Home / Self Care | Payer: Medicaid Other | Source: Ambulatory Visit | Attending: Radiation Oncology

## 2022-11-22 DIAGNOSIS — Z510 Encounter for antineoplastic radiation therapy: Secondary | ICD-10-CM | POA: Diagnosis not present

## 2022-11-22 DIAGNOSIS — D329 Benign neoplasm of meninges, unspecified: Secondary | ICD-10-CM

## 2022-11-22 LAB — RAD ONC ARIA SESSION SUMMARY
Course Elapsed Days: 33
Plan Fractions Treated to Date: 23
Plan Prescribed Dose Per Fraction: 1.8 Gy
Plan Total Fractions Prescribed: 29
Plan Total Prescribed Dose: 52.2 Gy
Reference Point Dosage Given to Date: 41.4 Gy
Reference Point Session Dosage Given: 1.8 Gy
Session Number: 24

## 2022-11-22 LAB — PREGNANCY, URINE: Preg Test, Ur: NEGATIVE

## 2022-11-22 MED ORDER — ONDANSETRON 8 MG PO TBDP: 8.0000 mg | ORAL_TABLET | Freq: Three times a day (TID) | ORAL | 1 refills | Status: AC | PRN

## 2022-11-22 MED ORDER — PROMETHAZINE HCL 25 MG PO TABS: 25.0000 mg | ORAL_TABLET | Freq: Four times a day (QID) | ORAL | 1 refills | Status: AC | PRN

## 2022-11-23 ENCOUNTER — Other Ambulatory Visit: Payer: Self-pay

## 2022-11-23 ENCOUNTER — Ambulatory Visit
Admission: RE | Admit: 2022-11-23 | Discharge: 2022-11-23 | Disposition: A | Discharge status: Home / Self Care | Payer: Medicaid Other | Source: Ambulatory Visit | Attending: Radiation Oncology

## 2022-11-23 DIAGNOSIS — Z510 Encounter for antineoplastic radiation therapy: Secondary | ICD-10-CM | POA: Diagnosis not present

## 2022-11-23 LAB — RAD ONC ARIA SESSION SUMMARY
Course Elapsed Days: 34
Plan Fractions Treated to Date: 24
Plan Prescribed Dose Per Fraction: 1.8 Gy
Plan Total Fractions Prescribed: 29
Plan Total Prescribed Dose: 52.2 Gy
Reference Point Dosage Given to Date: 43.2 Gy
Reference Point Session Dosage Given: 1.8 Gy
Session Number: 25

## 2022-11-24 ENCOUNTER — Ambulatory Visit
Admission: RE | Admit: 2022-11-24 | Discharge: 2022-11-24 | Disposition: A | Discharge status: Home / Self Care | Payer: Medicaid Other | Source: Ambulatory Visit | Attending: Radiation Oncology

## 2022-11-24 ENCOUNTER — Other Ambulatory Visit: Payer: Self-pay

## 2022-11-24 DIAGNOSIS — Z510 Encounter for antineoplastic radiation therapy: Secondary | ICD-10-CM | POA: Diagnosis not present

## 2022-11-24 LAB — RAD ONC ARIA SESSION SUMMARY
Course Elapsed Days: 35
Plan Fractions Treated to Date: 25
Plan Prescribed Dose Per Fraction: 1.8 Gy
Plan Total Fractions Prescribed: 29
Plan Total Prescribed Dose: 52.2 Gy
Reference Point Dosage Given to Date: 45 Gy
Reference Point Session Dosage Given: 1.8 Gy
Session Number: 26

## 2022-11-25 ENCOUNTER — Other Ambulatory Visit: Payer: Self-pay

## 2022-11-25 ENCOUNTER — Ambulatory Visit
Admission: RE | Admit: 2022-11-25 | Discharge: 2022-11-25 | Disposition: A | Discharge status: Home / Self Care | Payer: Medicaid Other | Source: Ambulatory Visit | Attending: Radiation Oncology | Admitting: Radiation Oncology

## 2022-11-25 DIAGNOSIS — Z510 Encounter for antineoplastic radiation therapy: Secondary | ICD-10-CM | POA: Diagnosis not present

## 2022-11-25 LAB — RAD ONC ARIA SESSION SUMMARY
Course Elapsed Days: 36
Plan Fractions Treated to Date: 26
Plan Prescribed Dose Per Fraction: 1.8 Gy
Plan Total Fractions Prescribed: 29
Plan Total Prescribed Dose: 52.2 Gy
Reference Point Dosage Given to Date: 46.8 Gy
Reference Point Session Dosage Given: 1.8 Gy
Session Number: 27

## 2022-11-26 ENCOUNTER — Ambulatory Visit
Admission: RE | Admit: 2022-11-26 | Discharge: 2022-11-26 | Disposition: A | Discharge status: Home / Self Care | Payer: Medicaid Other | Source: Ambulatory Visit | Attending: Radiation Oncology | Admitting: Radiation Oncology

## 2022-11-26 ENCOUNTER — Ambulatory Visit
Admission: RE | Admit: 2022-11-26 | Discharge: 2022-11-26 | Disposition: A | Discharge status: Home / Self Care | Payer: Medicaid Other | Source: Ambulatory Visit | Attending: Radiation Oncology

## 2022-11-26 ENCOUNTER — Other Ambulatory Visit: Payer: Self-pay

## 2022-11-26 DIAGNOSIS — Z510 Encounter for antineoplastic radiation therapy: Secondary | ICD-10-CM | POA: Diagnosis not present

## 2022-11-26 LAB — RAD ONC ARIA SESSION SUMMARY
Course Elapsed Days: 37
Plan Fractions Treated to Date: 27
Plan Prescribed Dose Per Fraction: 1.8 Gy
Plan Total Fractions Prescribed: 29
Plan Total Prescribed Dose: 52.2 Gy
Reference Point Dosage Given to Date: 48.6 Gy
Reference Point Session Dosage Given: 1.8 Gy
Session Number: 28

## 2022-11-29 ENCOUNTER — Ambulatory Visit
Admission: RE | Admit: 2022-11-29 | Discharge: 2022-11-29 | Disposition: A | Discharge status: Home / Self Care | Payer: Medicaid Other | Source: Ambulatory Visit | Attending: Radiation Oncology | Admitting: Radiation Oncology

## 2022-11-29 ENCOUNTER — Ambulatory Visit: Payer: Medicaid Other

## 2022-11-29 ENCOUNTER — Other Ambulatory Visit: Payer: Self-pay

## 2022-11-29 DIAGNOSIS — D329 Benign neoplasm of meninges, unspecified: Secondary | ICD-10-CM | POA: Insufficient documentation

## 2022-11-29 DIAGNOSIS — Z510 Encounter for antineoplastic radiation therapy: Secondary | ICD-10-CM | POA: Insufficient documentation

## 2022-11-29 LAB — RAD ONC ARIA SESSION SUMMARY
Course Elapsed Days: 40
Plan Fractions Treated to Date: 28
Plan Prescribed Dose Per Fraction: 1.8 Gy
Plan Total Fractions Prescribed: 29
Plan Total Prescribed Dose: 52.2 Gy
Reference Point Dosage Given to Date: 50.4 Gy
Reference Point Session Dosage Given: 1.8 Gy
Session Number: 29

## 2022-11-30 ENCOUNTER — Ambulatory Visit
Admission: RE | Admit: 2022-11-30 | Discharge: 2022-11-30 | Disposition: A | Discharge status: Home / Self Care | Payer: Medicaid Other | Source: Ambulatory Visit | Attending: Radiation Oncology | Admitting: Radiation Oncology

## 2022-11-30 ENCOUNTER — Other Ambulatory Visit: Payer: Self-pay

## 2022-11-30 DIAGNOSIS — Z510 Encounter for antineoplastic radiation therapy: Secondary | ICD-10-CM | POA: Diagnosis not present

## 2022-11-30 LAB — RAD ONC ARIA SESSION SUMMARY
Course Elapsed Days: 41
Plan Fractions Treated to Date: 29
Plan Prescribed Dose Per Fraction: 1.8 Gy
Plan Total Fractions Prescribed: 29
Plan Total Prescribed Dose: 52.2 Gy
Reference Point Dosage Given to Date: 52.2 Gy
Reference Point Session Dosage Given: 1.8 Gy
Session Number: 30

## 2022-12-02 NOTE — Radiation Completion Notes (Signed)
Patient Name: Kendra [name], Kendra [name] MRN: [id number] Date of Birth: 1986/01/08 Referring Physician: Kelton Pillar, M.D. Date of Service: 2022-12-02 Radiation Oncologist: Eppie Gibson, M.D. 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 Intent: Curative     ==========DELIVERED PLANS==========  First Treatment Date: 2022-10-20 - Last Treatment Date: 2022-11-30   Plan Name: Brain_Optic Site: Meninges Technique: IMRT Mode: Photon Dose Per Fraction: 1.8 Gy Prescribed Dose (Delivered / Prescribed): 52.2 Gy / 52.2 Gy Prescribed Fxs (Delivered / Prescribed): 29 / 29     ==========ON TREATMENT VISIT DATES========== 2022-10-25, 2022-11-01, 2022-11-08, 2022-11-16, 2022-11-22, 2022-11-26, 2022-11-29     ==========UPCOMING VISITS==========       ==========APPENDIX - ON TREATMENT VISIT NOTES==========   See weekly On Treatment Notes is Epic for details.

## 2022-12-13 ENCOUNTER — Encounter: Payer: Self-pay | Admitting: *Deleted

## 2022-12-30 NOTE — Progress Notes (Addendum)
Kendra [name] was called today for follow up after completing radiation treatment to her brain. She completed treatment on 11-30-22.  Recent neurologic symptoms, if any:  Seizures: no Headaches: no Nausea: no Dizziness/ataxia: no Difficulty with hand coordination: no Focal numbness/weakness: no Visual deficits/changes: no Confusion/Memory deficits: no  Other issues of note: Pt is doing well with no needs. She reports her symptoms have improved and she is feeling great overall. Pt was on her way to her sons 8th birthday party. She knows to call with concerns or questions.

## 2023-01-12 ENCOUNTER — Telehealth: Payer: Self-pay | Admitting: *Deleted

## 2023-01-12 NOTE — Telephone Encounter (Signed)
Returned patient's phone call, spoke with patient 

## 2023-01-13 ENCOUNTER — Ambulatory Visit
Admission: RE | Admit: 2023-01-13 | Discharge: 2023-01-13 | Disposition: A | Discharge status: Home / Self Care | Payer: Medicaid Other | Source: Ambulatory Visit | Attending: Radiation Oncology | Admitting: Radiation Oncology

## 2023-01-13 ENCOUNTER — Telehealth: Payer: Self-pay

## 2023-01-13 NOTE — Telephone Encounter (Signed)
Pt number still not working for telephone follow up. RN has attempted to reach out several times to attempt telephone follow up.

## 2023-01-13 NOTE — Telephone Encounter (Signed)
Pt called for telephone follow up without complication. Pt is doing well overall with no needs. Note completed and routed to Dr. Basilio Cairo.

## 2023-01-13 NOTE — Telephone Encounter (Signed)
Rn attempted to call pt for follow up without success. The number provided is not working and a Recruitment consultant was left for [place] her significant other. Rn will attempt to call back later.

## 2023-01-13 NOTE — Telephone Encounter (Signed)
Rn attempted to call pt for follow up with no success. The provided number is still not working. Rn was able to call her mom earlier and mom confirmed that number is correct (she could not get her either on this number however). Pt mom stated she would let her know to call RN when she as able to reach her.

## 2023-01-14 ENCOUNTER — Other Ambulatory Visit: Payer: Self-pay | Admitting: Radiation Therapy

## 2023-01-14 DIAGNOSIS — D320 Benign neoplasm of cerebral meninges: Secondary | ICD-10-CM

## 2023-01-18 ENCOUNTER — Other Ambulatory Visit: Payer: Self-pay | Admitting: *Deleted

## 2023-01-21 ENCOUNTER — Telehealth: Payer: Self-pay | Admitting: Internal Medicine

## 2023-01-21 NOTE — Telephone Encounter (Signed)
Scheduled per 05/21 scheduled message, patient has been called and notified. 

## 2023-02-10 ENCOUNTER — Other Ambulatory Visit: Payer: Self-pay | Admitting: Radiation Therapy

## 2023-03-26 ENCOUNTER — Other Ambulatory Visit: Payer: Self-pay | Admitting: Adult Health

## 2023-03-30 ENCOUNTER — Telehealth: Payer: Self-pay | Admitting: Adult Health

## 2023-03-30 NOTE — Telephone Encounter (Signed)
Pt request refills for lamoTRIgine (LAMICTAL) 100 MG tablet and gabapentin (NEURONTIN) 100 MG capsule sent to  [id number]   Pt scheduled appt August 30 at 11:00am

## 2023-03-31 MED ORDER — LAMOTRIGINE 100 MG PO TABS: 100.0000 mg | ORAL_TABLET | Freq: Two times a day (BID) | ORAL | 0 refills | Status: DC

## 2023-03-31 MED ORDER — GABAPENTIN 100 MG PO CAPS: 100.0000 mg | ORAL_CAPSULE | Freq: Every evening | ORAL | 0 refills | Status: DC

## 2023-03-31 NOTE — Telephone Encounter (Signed)
Refill sent until appt 8/20. Must complete FU for further refills.

## 2023-04-29 ENCOUNTER — Telehealth (INDEPENDENT_AMBULATORY_CARE_PROVIDER_SITE_OTHER): Payer: Medicaid Other | Admitting: Adult Health

## 2023-04-29 DIAGNOSIS — R569 Unspecified convulsions: Secondary | ICD-10-CM

## 2023-04-29 DIAGNOSIS — G43909 Migraine, unspecified, not intractable, without status migrainosus: Secondary | ICD-10-CM

## 2023-04-29 DIAGNOSIS — Z5181 Encounter for therapeutic drug level monitoring: Secondary | ICD-10-CM | POA: Diagnosis not present

## 2023-04-29 DIAGNOSIS — G43709 Chronic migraine without aura, not intractable, without status migrainosus: Secondary | ICD-10-CM

## 2023-04-29 MED ORDER — GABAPENTIN 100 MG PO CAPS: 100.0000 mg | ORAL_CAPSULE | Freq: Every evening | ORAL | 11 refills | Status: DC

## 2023-04-29 NOTE — Progress Notes (Unsigned)
PATIENT: Kendra [name] DOB: October 28, 1985  REASON FOR VISIT: follow up HISTORY FROM: patient  Virtual Visit via Video Note  I connected with Kendra [name] on 04/29/23 at 11:00 AM EDT by a video enabled telemedicine application located remotely at [facility] and verified that I am speaking with the correct person using two identifiers who was located at their own home.   I discussed the limitations of evaluation and management by telemedicine and the availability of in person appointments. The patient expressed understanding and agreed to proceed.   PATIENT: Kendra [name] DOB: [birth year]  REASON FOR VISIT: follow up HISTORY FROM: patient  HISTORY OF PRESENT ILLNESS: Today 04/29/23:  Kendra [name] is a 37 y.o. female with a history of seizures. Returns today for follow-up. Denies any seizure events. She reports that she had return of meningoma- concerned because of thickening of optic nerve. Radiation was completed in February  She has been off gabapentin and headaches have remained relatively stable however she would like to restart this. States that she slept better and doesn't want her headaches to return.   12/11/20:   Kendra [name] is a 37 year old female with a history of seizures.  She returns today for follow-up.  She states that she has not had any additional seizures.  She continues on Lamictal 100 mg twice a day.  Continues on gabapentin 100 mg at bedtime for headaches.  Reports that she has not had any severe headaches.  She returns today for an evaluation.  11/03/20: Kendra [name] is a 37 year old female with a history of seizures.  She returns today for follow-up.  She states on February 1 she was taken to the hospital for seizure event.  She states that she was at a client's house and had a seizure event and lost consciousness.  When she fell she did hit her right shoulder and head.  Reports that at the ED they did imaging.  CT of the head was unremarkable.   She does have a fracture in the right shoulder.  She states that since the event she has been having pain in the left side of the head.  She states that it feels muscular but she did not hit her head on the side.  The patient reports that she did not miss any medication.  However I read the ED note and it appears the patient had Covid 2 weeks prior to this event.  She reported to the ED that she had missed some doses of her medication while she had Covid.  Today the patient does not recall that.  She joins me today for a virtual visit.  HISTORY 05/07/20:   Kendra [name] is a 37 year old female with a history of seizures, daily headaches and memory disturbance.  She returns today for follow-up.  She denies any seizure events.  Continues on Lamictal.  Initially she reports that she is taking gabapentin at bedtime and it has been beneficial for headaches however according to my chart message we can switch her to Lyrica.  She is unsure if she is taking gabapentin or Lyrica.  She will check her prescriptions when she get home.  She feels that her memory has remained stable.  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  She returns today for an evaluation.  REVIEW OF SYSTEMS: Out of a complete 14 system review of symptoms, the patient complains only of the following symptoms, and all other  reviewed systems are negative.  ALLERGIES: Allergies  Allergen Reactions   Amoxicillin    Asa Buff (Mag [Buffered Aspirin] Anaphylaxis   Erythromycin Anaphylaxis   Iodine Anaphylaxis, Hives and Other (See Comments)   Peanut-Containing Drug Products Anaphylaxis and Hives    Pt is allergic to peanut oil   Shellfish Allergy Anaphylaxis, Hives, Swelling and Other (See Comments)    Pt throat swells   Doxycycline    Other Other (See Comments)   Ibuprofen Rash    HOME MEDICATIONS: Outpatient Medications Prior to Visit  Medication Sig Dispense Refill   acetaminophen (TYLENOL) 500 MG tablet Take 1,000  mg by mouth every 6 (six) hours as needed (for pain/headaches.).     albuterol (VENTOLIN HFA) 108 (90 Base) MCG/ACT inhaler Inhale 2 puffs into the lungs every 6 (six) hours as needed for wheezing or shortness of breath. 1 each 0   budesonide-formoterol (SYMBICORT) 160-4.5 MCG/ACT inhaler Inhale 2 puffs into the lungs 2 (two) times daily. 1 each 0   cetirizine (ZYRTEC) 10 MG tablet Take 10 mg by mouth daily.     diphenhydrAMINE (BENADRYL) 25 mg capsule Take 1 capsule (25 mg total) by mouth every 6 (six) hours as needed. (Patient taking differently: Take 25 mg by mouth every 6 (six) hours as needed for allergies.) 30 capsule 0   doxycycline (VIBRAMYCIN) 100 MG capsule Take 100 mg by mouth daily.     EPINEPHrine 0.3 mg/0.3 mL IJ SOAJ injection Inject 0.3 mg into the skin once.     fluticasone (FLONASE) 50 MCG/ACT nasal spray Place 1 spray into both nostrils daily. 16 g 0   gabapentin (NEURONTIN) 100 MG capsule Take 1 capsule (100 mg total) by mouth at bedtime. TAKE 1 CAPSULE(100 MG) BY MOUTH AT BEDTIME 30 capsule 0   lamoTRIgine (LAMICTAL) 100 MG tablet Take 1 tablet (100 mg total) by mouth 2 (two) times daily. TAKE 2 TABLETS(200 MG) BY MOUTH TWICE DAILY 120 tablet 0   LINZESS 290 MCG CAPS capsule Take 290 mcg by mouth daily as needed (constipation).   3   LORazepam (ATIVAN) 1 MG tablet Take 1 tab PO 20 minutes before MRI or radiation procedures, prn anxiety, claustrophobia. 15 tablet 0   ondansetron (ZOFRAN-ODT) 8 MG disintegrating tablet Take 1 tablet (8 mg total) by mouth every 8 (eight) hours as needed for nausea or vomiting. 20 tablet 1   pantoprazole (PROTONIX) 40 MG tablet Take 1 tablet (40 mg) twice daily for two weeks. Then take 1 tablet (40 mg) daily. 90 tablet 3   Polyethylene Glycol 3350 (MIRALAX PO) Take by mouth.     promethazine (PHENERGAN) 25 MG tablet Take 1 tablet (25 mg total) by mouth every 6 (six) hours as needed for nausea or vomiting. 30 tablet 1   psyllium (METAMUCIL) 58.6 %  powder Take 1 packet by mouth as needed.     Tretinoin Microsphere (RETIN-A MICRO PUMP) 0.08 % GEL Apply 1 application topically at bedtime. Apply one application topically to face each night as needed.     Vitamin D, Ergocalciferol, (DRISDOL) 1.25 MG (50000 UNIT) CAPS capsule Take 1 capsule (50,000 Units total) by mouth every 7 (seven) days. 4 capsule 0   zinc gluconate 50 MG tablet Take 50 mg by mouth daily.     No facility-administered medications prior to visit.    PAST MEDICAL HISTORY: Past Medical History:  Diagnosis Date   Anxiety    Asthma    Back pain    Bilateral swelling of  feet and ankles    Constipation    Convulsions/seizures (HCC) 07/27/2016   most recent 10/17/17   Depression    Epilepsy (HCC)    Family history of breast cancer    Family history of colon cancer    GERD (gastroesophageal reflux disease)    H/O: C-section    Headache    Joint pain    Lipoma    Lipoma of LUQ abdomen, s/p removal 06/27/2012 05/24/2012   Meningioma (HCC) 07/27/2016   Anterior fossa   Multiple food allergies    Other fatigue    Shortness of breath    Shortness of breath on exertion     PAST SURGICAL HISTORY: Past Surgical History:  Procedure Laterality Date   ABDOMINAL SURGERY  12/13/2011   APPLICATION OF CRANIAL NAVIGATION N/A 12/10/2016   Procedure: APPLICATION OF CRANIAL NAVIGATION;  Surgeon: Lisbeth Renshaw, MD;  Location: [facility] OR;  Service: Neurosurgery;  Laterality: N/A;   BRAIN MENINGIOMA EXCISION  07/27/2016   BRAIN SURGERY     BREAST SURGERY  12/30/11   breast reduction   CESAREAN SECTION  09/09/2010   [facility]   CESAREAN SECTION N/A 01/13/2015   Procedure: CESAREAN SECTION;  Surgeon: Myna Hidalgo, DO;  Location: [facility]S;  Service: Obstetrics;  Laterality: N/A;  EDD 01/18/15 would like [place] dressing   CHOLECYSTECTOMY N/A 11/23/2016   Procedure: LAPAROSCOPIC CHOLECYSTECTOMY WITH INTRAOPERATIVE CHOLANGIOGRAM;  Surgeon: Manus Rudd, MD;  Location: [facility] OR;  Service: General;   Laterality: N/A;   CRANIOTOMY N/A 12/10/2016   Procedure: BICORONAL CRANIOTOMY FOR RESECTION OF TUMOR;  Surgeon: Lisbeth Renshaw, MD;  Location: [facility] OR;  Service: Neurosurgery;  Laterality: N/A;  BICORONAL CRANIOTOMY FOR RESECTION OF TUMOR    INTRAPERITONEAL PORT PLACEMENT  12/10/2016   LIPOMA EXCISION  06/27/2012   LUQ   LYMPH NODE DISSECTION  2013   Abdominal    FAMILY HISTORY: Family History  Problem Relation Age of Onset   Kidney disease Mother        kidney stones   Healthy Father    Breast cancer Maternal Aunt        dx under 50   Heart disease Maternal Uncle    Stroke Maternal Uncle    Colon cancer Maternal Uncle        dx over 2   Breast cancer Maternal Grandmother        dx over 50   Deep vein thrombosis Maternal Grandfather        in throat   Diabetes Paternal Grandfather     SOCIAL HISTORY: Social History   Socioeconomic History   Marital status: Single    Spouse name: Biomedical engineer   Number of children: 2   Years of education: 12   Highest education level: Not on file  Occupational History   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  Tobacco Use   Smoking status: Former    Types: Cigars    Start date: 08/31/1999    Quit date: 08/30/2015    Years since quitting: 7.6   Smokeless tobacco: Never  Vaping Use   Vaping status: Never Used  Substance and Sexual Activity   Alcohol use: No   Drug use: Yes    Types: Marijuana   Sexual activity: Yes    Partners: Male    Birth control/protection: None  Other Topics Concern   Not on file  Social History Narrative   Lives at home w/ her 2 children   Right-handed   Caffeine: couple of cups per month  Social Determinants of Health   Financial Resource Strain: Not on file  Food Insecurity: No Food Insecurity (09/27/2022)   Hunger Vital Sign    Worried About Running Out of Food in the Last Year: Never true    Ran Out of Food in the Last Year: Never true  Transportation Needs: No Transportation Needs (09/27/2022)   PRAPARE -  Administrator, Civil Service (Medical): No    Lack of Transportation (Non-Medical): No  Physical Activity: Not on file  Stress: Not on file  Social Connections: Unknown (12/28/2021)   Received from [facility]   Social Network    Social Network: Not on file  Intimate Partner Violence: Not At Risk (09/27/2022)   Humiliation, Afraid, Rape, and Kick questionnaire    Fear of Current or Ex-Partner: No    Emotionally Abused: No    Physically Abused: No    Sexually Abused: No      PHYSICAL EXAM Generalized: Well developed, in no acute distress   Neurological examination  Mentation: Alert oriented to time, place, history taking. Follows all commands speech and language fluent Cranial nerve II-XII:Extraocular movements were full. Facial symmetry noted. uvula tongue midline. Head turning and shoulder shrug  were normal and symmetric. Motor: Good strength throughout subjectively per patient Sensory: Sensory testing is intact to soft touch on all 4 extremities subjectively per patient Coordination: Cerebellar testing reveals good finger-nose-finger  Gait and station: Patient is able to stand from a seated position. gait is normal.  Reflexes: UTA  DIAGNOSTIC DATA (LABS, IMAGING, TESTING) - I reviewed patient records, labs, notes, testing and imaging myself where available.  Lab Results  Component Value Date   WBC 6.3 07/28/2022   HGB 13.8 07/28/2022   HCT 43.0 07/28/2022   MCV 94.3 07/28/2022   PLT 463 (H) 07/28/2022      Component Value Date/Time   NA 138 07/28/2022 1519   NA 138 11/19/2020 1049   K 3.8 07/28/2022 1519   CL 100 07/28/2022 1519   CO2 24 07/28/2022 1519   GLUCOSE 104 (H) 07/28/2022 1519   BUN 11 07/28/2022 1519   BUN 10 11/19/2020 1049   CREATININE 0.87 07/28/2022 1519   CALCIUM 9.2 07/28/2022 1519   PROT 7.3 11/19/2020 1049   ALBUMIN 4.5 11/19/2020 1049   AST 12 11/19/2020 1049   ALT 14 11/19/2020 1049   ALKPHOS 101 11/19/2020 1049   BILITOT  0.3 11/19/2020 1049   GFRNONAA >60 07/28/2022 1519   GFRAA 115 09/19/2019 1407      ASSESSMENT AND PLAN 37 y.o. year old female  has a past medical history of Anxiety, Asthma, Back pain, Bilateral swelling of feet and ankles, Constipation, Convulsions/seizures (HCC) (07/27/2016), Depression, Epilepsy (HCC), Family history of breast cancer, Family history of colon cancer, GERD (gastroesophageal reflux disease), H/O: C-section, Headache, Joint pain, Lipoma, Lipoma of LUQ abdomen, s/p removal 06/27/2012 (05/24/2012), Meningioma (HCC) (07/27/2016), Multiple food allergies, Other fatigue, Shortness of breath, and Shortness of breath on exertion. here with:  1.  Seizures 2. Migraines  --Continue Lamictal 200 mg twice a day --Check blood work - Restart Gabapentin 100 mg at bedtime for migrianes --She will follow-up in the office in 6 months    Butch Penny, MSN, NP-C 04/29/2023, 10:58 AM [facility] [address] [place], [place] [address] [phone]

## 2023-05-05 NOTE — Progress Notes (Signed)
Chart reviewed, agree above plan ?

## 2023-05-31 ENCOUNTER — Ambulatory Visit (HOSPITAL_COMMUNITY): Admission: RE | Admit: 2023-05-31 | Payer: Medicaid Other | Source: Ambulatory Visit

## 2023-05-31 ENCOUNTER — Ambulatory Visit (HOSPITAL_COMMUNITY): Payer: Medicaid Other

## 2023-06-06 ENCOUNTER — Inpatient Hospital Stay: Payer: No Typology Code available for payment source

## 2023-06-06 ENCOUNTER — Inpatient Hospital Stay: Payer: No Typology Code available for payment source | Admitting: Internal Medicine

## 2023-06-10 ENCOUNTER — Ambulatory Visit (HOSPITAL_COMMUNITY)
Admission: RE | Admit: 2023-06-10 | Discharge: 2023-06-10 | Disposition: A | Discharge status: Home / Self Care | Payer: Medicaid Other | Source: Ambulatory Visit | Attending: Radiation Oncology | Admitting: Radiation Oncology

## 2023-06-10 DIAGNOSIS — D320 Benign neoplasm of cerebral meninges: Secondary | ICD-10-CM | POA: Insufficient documentation

## 2023-06-10 MED ORDER — GADOBUTROL 1 MMOL/ML IV SOLN
10.0000 mL | Freq: Once | INTRAVENOUS | Status: AC | PRN
  Administered 2023-06-10: 10 mL via INTRAVENOUS

## 2023-06-14 ENCOUNTER — Inpatient Hospital Stay: Payer: No Typology Code available for payment source | Admitting: Internal Medicine

## 2023-06-14 ENCOUNTER — Telehealth: Payer: Self-pay | Admitting: Internal Medicine

## 2023-06-14 ENCOUNTER — Telehealth: Payer: Self-pay | Admitting: *Deleted

## 2023-06-14 NOTE — Telephone Encounter (Signed)
PC to patient, no answer, left VM -  patient missed her 9:30 appointment this morning.  Informed patient our scheduling department will contact her to reschedule.  Instructed patient to contact this office with any further questions/concerns, [phone].  Scheduling message sent.

## 2023-06-16 ENCOUNTER — Inpatient Hospital Stay: Payer: No Typology Code available for payment source | Admitting: Internal Medicine

## 2023-06-23 ENCOUNTER — Inpatient Hospital Stay: Payer: No Typology Code available for payment source | Attending: Internal Medicine | Admitting: Internal Medicine

## 2023-06-23 ENCOUNTER — Telehealth: Payer: Self-pay | Admitting: *Deleted

## 2023-06-23 NOTE — Telephone Encounter (Signed)
PC to patient, no answer, left VM - informed patient she missed her new patient appointment with Dr Barbaraann Cao today.  Informed her our scheduling department will contact her to reschedule or she may call scheduling, [phone].  Scheduling message sent.

## 2023-06-28 ENCOUNTER — Telehealth: Payer: Self-pay | Admitting: Internal Medicine

## 2023-06-28 ENCOUNTER — Inpatient Hospital Stay: Payer: No Typology Code available for payment source | Admitting: Internal Medicine

## 2023-07-05 ENCOUNTER — Inpatient Hospital Stay: Payer: Medicaid Other | Attending: Internal Medicine | Admitting: Internal Medicine

## 2023-07-05 ENCOUNTER — Other Ambulatory Visit: Payer: Self-pay | Admitting: Internal Medicine

## 2023-07-05 VITALS — BP 127/77 | HR 73 | Temp 97.3°F | Resp 18 | Wt 252.1 lb

## 2023-07-05 DIAGNOSIS — D329 Benign neoplasm of meninges, unspecified: Secondary | ICD-10-CM

## 2023-07-05 DIAGNOSIS — Z79899 Other long term (current) drug therapy: Secondary | ICD-10-CM

## 2023-07-05 NOTE — Progress Notes (Signed)
[facility] [address]  [place], [place] [address] [phone]   New Patient Evaluation  Date of Service: 07/05/23 Patient Name: Kendra [name] Patient MRN: [id number] Patient DOB: Feb 08, 1986 Provider: Henreitta Leber, MD  Identifying Statement:  Kendra [name] is a 37 y.o. 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    Referring Provider: Ollen Bowl, MD [address] [place],  [place] [address]  Oncologic History 12/10/16: Craniotomy, debulking of olfactory groove meningioma Kendra [name]) 12/02/22: Completes IMRT with Dr. Basilio [name] for tumor infiltrating bilateral optic nerves   Biomarkers:  History of Present Illness: The patient's records from the referring physician were obtained and reviewed and the patient interviewed to confirm this HPI.  Kendra [name] presents today for follow up after recent MRI brain, now having completed 6 weeks of radiation therapy with Dr. Basilio [name].  She reports no new symptoms.  No changes in her vision, remains with very little useful vision in the left eye, stable since 2018.  Headaches are sporadic, she doses gabapentin 100mg  HS.  No recent seizures, continues on Lamictal 100mg  twice per day.  Medications: Current Outpatient Medications on File Prior to Visit  Medication Sig Dispense Refill   acetaminophen (TYLENOL) 500 MG tablet Take 1,000 mg by mouth every 6 (six) hours as needed (for pain/headaches.).     albuterol (VENTOLIN HFA) 108 (90 Base) MCG/ACT inhaler Inhale 2 puffs into the lungs every 6 (six) hours as needed for wheezing or shortness of breath. 1 each 0   budesonide-formoterol (SYMBICORT) 160-4.5 MCG/ACT inhaler Inhale 2 puffs into the lungs 2 (two) times daily. 1 each 0   cetirizine (ZYRTEC) 10 MG tablet Take 10 mg by mouth daily.     diphenhydrAMINE (BENADRYL) 25 mg capsule Take 1 capsule (25 mg total) by mouth every 6 (six)  hours as needed. (Patient taking differently: Take 25 mg by mouth every 6 (six) hours as needed for allergies.) 30 capsule 0   doxycycline (VIBRAMYCIN) 100 MG capsule Take 100 mg by mouth daily.     EPINEPHrine 0.3 mg/0.3 mL IJ SOAJ injection Inject 0.3 mg into the skin once.     FLUoxetine (PROZAC) 10 MG capsule Take 10 mg by mouth daily. Once a day for first week, then twice a day     fluticasone (FLONASE) 50 MCG/ACT nasal spray Place 1 spray into both nostrils daily. 16 g 0   gabapentin (NEURONTIN) 100 MG capsule Take 1 capsule (100 mg total) by mouth at bedtime. TAKE 1 CAPSULE(100 MG) BY MOUTH AT BEDTIME 30 capsule 11   hydrOXYzine (ATARAX) 25 MG tablet Take 25 mg by mouth as needed.     ipratropium (ATROVENT) 0.06 % nasal spray Place 1 spray into both nostrils 3 (three) times daily.     lamoTRIgine (LAMICTAL) 100 MG tablet Take 1 tablet (100 mg total) by mouth 2 (two) times daily. TAKE 2 TABLETS(200 MG) BY MOUTH TWICE DAILY 120 tablet 0   levonorgestrel (MIRENA, 52 MG,) 20 MCG/DAY IUD 1 each by Intrauterine route once.     LINZESS 290 MCG CAPS capsule Take 290 mcg by mouth daily as needed (constipation).   3   LORazepam (ATIVAN) 1 MG tablet Take 1 tab PO 20 minutes before MRI or radiation procedures, prn anxiety, claustrophobia. 15 tablet 0   metFORMIN (GLUCOPHAGE-XR) 500 MG 24 hr tablet Take 500 mg by mouth 2 (two) times daily with  a meal.     naproxen (NAPROSYN) 250 MG tablet Take 250 mg by mouth as needed.     ondansetron (ZOFRAN-ODT) 8 MG disintegrating tablet Take 1 tablet (8 mg total) by mouth every 8 (eight) hours as needed for nausea or vomiting. 20 tablet 1   pantoprazole (PROTONIX) 40 MG tablet Take 1 tablet (40 mg) twice daily for two weeks. Then take 1 tablet (40 mg) daily. 90 tablet 3   Polyethylene Glycol 3350 (MIRALAX PO) Take by mouth.     promethazine (PHENERGAN) 25 MG tablet Take 1 tablet (25 mg total) by mouth every 6 (six) hours as needed for nausea or vomiting. 30 tablet 1    psyllium (METAMUCIL) 58.6 % powder Take 1 packet by mouth as needed.     Tretinoin Microsphere (RETIN-A MICRO PUMP) 0.08 % GEL Apply 1 application topically at bedtime. Apply one application topically to face each night as needed.     Vitamin D, Ergocalciferol, (DRISDOL) 1.25 MG (50000 UNIT) CAPS capsule Take 1 capsule (50,000 Units total) by mouth every 7 (seven) days. 4 capsule 0   zinc gluconate 50 MG tablet Take 50 mg by mouth daily.     No current facility-administered medications on file prior to visit.    Allergies:  Allergies  Allergen Reactions   Amoxicillin    Asa Buff (Mag [Buffered Aspirin] Anaphylaxis   Erythromycin Anaphylaxis   Iodine Anaphylaxis, Hives and Other (See Comments)   Peanut-Containing Drug Products Anaphylaxis and Hives    Pt is allergic to peanut oil   Shellfish Allergy Anaphylaxis, Hives, Swelling and Other (See Comments)    Pt throat swells   Doxycycline    Other Other (See Comments)   Ibuprofen Rash   Past Medical History:  Past Medical History:  Diagnosis Date   Anxiety    Asthma    Back pain    Bilateral swelling of feet and ankles    Constipation    Convulsions/seizures (HCC) 07/27/2016   most recent 10/17/17   Depression    Epilepsy (HCC)    Family history of breast cancer    Family history of colon cancer    GERD (gastroesophageal reflux disease)    H/O: C-section    Headache    Joint pain    Lipoma    Lipoma of LUQ abdomen, s/p removal 06/27/2012 05/24/2012   Meningioma (HCC) 07/27/2016   Anterior fossa   Multiple food allergies    Other fatigue    Shortness of breath    Shortness of breath on exertion    Past Surgical History:  Past Surgical History:  Procedure Laterality Date   ABDOMINAL SURGERY  12/13/2011   APPLICATION OF CRANIAL NAVIGATION N/A 12/10/2016   Procedure: APPLICATION OF CRANIAL NAVIGATION;  Surgeon: Kendra Renshaw, MD;  Location: [facility] OR;  Service: Neurosurgery;  Laterality: N/A;   BRAIN MENINGIOMA  EXCISION  07/27/2016   BRAIN SURGERY     BREAST SURGERY  12/30/11   breast reduction   CESAREAN SECTION  09/09/2010   [facility]   CESAREAN SECTION N/A 01/13/2015   Procedure: CESAREAN SECTION;  Surgeon: Myna Hidalgo, DO;  Location: [facility] ORS;  Service: Obstetrics;  Laterality: N/A;  EDD 01/18/15 would like [place] dressing   CHOLECYSTECTOMY N/A 11/23/2016   Procedure: LAPAROSCOPIC CHOLECYSTECTOMY WITH INTRAOPERATIVE CHOLANGIOGRAM;  Surgeon: Manus Rudd, MD;  Location: [facility] OR;  Service: General;  Laterality: N/A;   CRANIOTOMY N/A 12/10/2016   Procedure: BICORONAL CRANIOTOMY FOR RESECTION OF TUMOR;  Surgeon: Kendra Renshaw, MD;  Location: [facility] OR;  Service: Neurosurgery;  Laterality: N/A;  BICORONAL CRANIOTOMY FOR RESECTION OF TUMOR    INTRAPERITONEAL PORT PLACEMENT  12/10/2016   LIPOMA EXCISION  06/27/2012   LUQ   LYMPH NODE DISSECTION  2013   Abdominal   Social History:  Social History   Socioeconomic History   Marital status: Single    Spouse name: Jelani   Number of children: 2   Years of education: 12   Highest education level: Not on file  Occupational History   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  Tobacco Use   Smoking status: Former    Types: Cigars    Start date: 08/31/1999    Quit date: 08/30/2015    Years since quitting: 7.8   Smokeless tobacco: Never  Vaping Use   Vaping status: Never Used  Substance and Sexual Activity   Alcohol use: No   Drug use: Yes    Types: Marijuana   Sexual activity: Yes    Partners: Male    Birth control/protection: None  Other Topics Concern   Not on file  Social History Narrative   Lives at home w/ her 2 children   Right-handed   Caffeine: couple of cups per month   Social Determinants of Health   Financial Resource Strain: Not on file  Food Insecurity: No Food Insecurity (09/27/2022)   Hunger Vital Sign    Worried About Running Out of Food in the Last Year: Never true    Ran Out of Food in the Last Year: Never true  Transportation Needs: No  Transportation Needs (09/27/2022)   PRAPARE - Administrator, Civil Service (Medical): No    Lack of Transportation (Non-Medical): No  Physical Activity: Not on file  Stress: Not on file  Social Connections: Unknown (12/28/2021)   Received from [facility], [facility]   Social Network    Social Network: Not on file  Intimate Partner Violence: Not At Risk (09/27/2022)   Humiliation, Afraid, Rape, and Kick questionnaire    Fear of Current or Ex-Partner: No    Emotionally Abused: No    Physically Abused: No    Sexually Abused: No   Family History:  Family History  Problem Relation Age of Onset   Kidney disease Mother        kidney stones   Healthy Father    Breast cancer Maternal Aunt        dx under 50   Heart disease Maternal Uncle    Stroke Maternal Uncle    Colon cancer Maternal Uncle        dx over 58   Breast cancer Maternal Grandmother        dx over 50   Deep vein thrombosis Maternal Grandfather        in throat   Diabetes Paternal Grandfather     Review of Systems: Constitutional: Doesn't report fevers, chills or abnormal weight loss Eyes: Doesn't report blurriness of vision Ears, nose, mouth, throat, and face: Doesn't report sore throat Respiratory: Doesn't report cough, dyspnea or wheezes Cardiovascular: Doesn't report palpitation, chest discomfort  Gastrointestinal:  Doesn't report nausea, constipation, diarrhea GU: Doesn't report incontinence Skin: Doesn't report skin rashes Neurological: Per HPI Musculoskeletal: Doesn't report joint pain Behavioral/Psych: Doesn't report anxiety  Physical Exam: Vitals:   07/05/23 0839  BP: 127/77  Pulse: 73  Resp: 18  Temp: (!) 97.3 F (36.3 C)  SpO2: 100%   KPS: 80. General: Alert, cooperative, pleasant, in no acute distress Head: Normal  EENT: No conjunctival injection or scleral icterus.  Lungs: Resp effort normal Cardiac: Regular rate Abdomen: Non-distended abdomen Skin: No rashes cyanosis or  petechiae. Extremities: No clubbing or edema  Neurologic Exam: Mental Status: Awake, alert, attentive to examiner. Oriented to self and environment. Language is fluent with intact comprehension.  Cranial Nerves: Visual acuity is impaired in left eye. Visual fields are full. Extra-ocular movements intact. No ptosis. Face is symmetric Motor: Tone and bulk are normal. Power is full in both arms and legs. Reflexes are symmetric, no pathologic reflexes present.  Sensory: Intact to light touch Gait: Normal.   Labs: I have reviewed the data as listed    Component Value Date/Time   NA 138 07/28/2022 1519   NA 138 11/19/2020 1049   K 3.8 07/28/2022 1519   CL 100 07/28/2022 1519   CO2 24 07/28/2022 1519   GLUCOSE 104 (H) 07/28/2022 1519   BUN 11 07/28/2022 1519   BUN 10 11/19/2020 1049   CREATININE 0.87 07/28/2022 1519   CALCIUM 9.2 07/28/2022 1519   PROT 7.3 11/19/2020 1049   ALBUMIN 4.5 11/19/2020 1049   AST 12 11/19/2020 1049   ALT 14 11/19/2020 1049   ALKPHOS 101 11/19/2020 1049   BILITOT 0.3 11/19/2020 1049   GFRNONAA >60 07/28/2022 1519   GFRAA 115 09/19/2019 1407   Lab Results  Component Value Date   WBC 6.3 07/28/2022   NEUTROABS 3.1 07/28/2022   HGB 13.8 07/28/2022   HCT 43.0 07/28/2022   MCV 94.3 07/28/2022   PLT 463 (H) 07/28/2022    Imaging: [facility] Clinician Interpretation: I have personally reviewed the CNS images as listed.  My interpretation, in the context of the patient's clinical presentation, is stable disease pending official read  No results found.   Pathology:   Assessment/Plan Meningioma [facility])  We appreciate the opportunity to participate in the care of Kendra [name].  She is clinically stable today with baseline visual deficits, now having completed radiation therapy to refractory and invasive tumor burden.  Brain MRI demonstrates stable findings, pending official read.  She should continue Lamictal 100mg  BID for seizure prevention, Gabapentin  100mg  HS as prior.  Screening for potential clinical trials was performed and discussed using eligibility criteria for active protocols at [facility], loco-regional tertiary centers, as well as national database available on GroundTransfer[employer].    The patient is not a candidate for a research protocol at this time due to stable disease.   We spent twenty additional minutes teaching regarding the natural history, biology, and historical experience in the treatment of brain tumors. We then discussed in detail the current recommendations for therapy focusing on the mode of administration, mechanism of action, anticipated toxicities, and quality of life issues associated with this plan. We also provided teaching sheets for the patient to take home as an additional resource.  We ask that Kendra [name] return to clinic in 6 months following next brain MRI, or sooner as needed.  All questions were answered. The patient knows to call the clinic with any problems, questions or concerns. No barriers to learning were detected.  The total time spent in the encounter was 60 minutes and more than 50% was on counseling and review of test results   Kendra Leber, MD Medical Director of Neuro-Oncology [facility] at [facility] 07/05/23 8:59 AM

## 2023-07-09 ENCOUNTER — Telehealth: Payer: Self-pay | Admitting: Internal Medicine

## 2023-07-09 NOTE — Telephone Encounter (Signed)
Scheduled per 11/05 los, patient has been called and voicemail was left.

## 2023-10-25 ENCOUNTER — Ambulatory Visit: Payer: Medicaid Other | Admitting: Neurology

## 2023-12-08 ENCOUNTER — Ambulatory Visit
Admission: RE | Admit: 2023-12-08 | Discharge: 2023-12-08 | Disposition: A | Discharge status: Home / Self Care | Payer: Medicaid Other | Source: Ambulatory Visit | Attending: Internal Medicine | Admitting: Internal Medicine

## 2023-12-08 DIAGNOSIS — D329 Benign neoplasm of meninges, unspecified: Secondary | ICD-10-CM

## 2023-12-08 MED ORDER — GADOPICLENOL 0.5 MMOL/ML IV SOLN
10.0000 mL | Freq: Once | INTRAVENOUS | Status: AC | PRN
  Administered 2023-12-08: 10 mL via INTRAVENOUS

## 2023-12-13 ENCOUNTER — Telehealth: Payer: Self-pay | Admitting: Internal Medicine

## 2023-12-13 ENCOUNTER — Inpatient Hospital Stay: Payer: Medicaid Other | Admitting: Internal Medicine

## 2023-12-13 ENCOUNTER — Telehealth: Payer: Self-pay | Admitting: *Deleted

## 2023-12-13 NOTE — Telephone Encounter (Signed)
 Kendra [name] called in to cancel her appointment. Kendra [name] left a VM and I returned her call to reschedule her appointment.

## 2023-12-13 NOTE — Telephone Encounter (Signed)
 PC to patient, no answer, left VM - informed patient she missed her 10:00 appointment this morning, our scheduling department will contact her to reschedule.  Instructed her to call office with any questions/concerns, [phone].  Scheduling message sent.

## 2023-12-15 ENCOUNTER — Telehealth: Payer: Self-pay

## 2023-12-15 ENCOUNTER — Ambulatory Visit: Admitting: Internal Medicine

## 2023-12-15 NOTE — Telephone Encounter (Signed)
 Patient called in regards to appointment today with Dr. Mark Sil. Patient reports that she forgot about the appointment and would like to reschedule.  Patient knows to expect a call from our scheduling team within the next few business days to get rescheduled. Patient verbalized an understanding and agreeable to being rescheduled.  Dr. Mark Sil aware. Scheduling message sent.

## 2023-12-16 ENCOUNTER — Telehealth: Payer: Self-pay | Admitting: Internal Medicine

## 2023-12-16 NOTE — Telephone Encounter (Signed)
 Scheduled patient's appts. Advised patient to contact us if rescheduling is needed. Provided my direct line.

## 2023-12-29 ENCOUNTER — Telehealth: Payer: Self-pay | Admitting: Internal Medicine

## 2023-12-29 ENCOUNTER — Telehealth: Payer: Self-pay | Admitting: *Deleted

## 2023-12-29 ENCOUNTER — Inpatient Hospital Stay: Admitting: Internal Medicine

## 2023-12-29 NOTE — Telephone Encounter (Signed)
 I left a detailed message of the patient's appointment details. I informed the patient to call back if appointments do not work. I provided my direct line.

## 2023-12-29 NOTE — Telephone Encounter (Signed)
 PC to patient regarding missed appointment today, no answer, left VM - informed patient our scheduling department will contact her to reschedule.  Scheduling message sent.

## 2024-01-03 ENCOUNTER — Telehealth: Payer: Self-pay | Admitting: *Deleted

## 2024-01-03 ENCOUNTER — Telehealth (INDEPENDENT_AMBULATORY_CARE_PROVIDER_SITE_OTHER): Payer: Medicaid Other | Admitting: Adult Health

## 2024-01-03 DIAGNOSIS — R569 Unspecified convulsions: Secondary | ICD-10-CM

## 2024-01-03 DIAGNOSIS — Z5181 Encounter for therapeutic drug level monitoring: Secondary | ICD-10-CM | POA: Diagnosis not present

## 2024-01-03 DIAGNOSIS — G43009 Migraine without aura, not intractable, without status migrainosus: Secondary | ICD-10-CM

## 2024-01-03 DIAGNOSIS — G43709 Chronic migraine without aura, not intractable, without status migrainosus: Secondary | ICD-10-CM

## 2024-01-03 NOTE — Telephone Encounter (Signed)
 Patient logged onto 815 VV at 838 am. I called her and was able to move her to another opening at 930 this am.
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
## 2024-01-05 ENCOUNTER — Inpatient Hospital Stay: Attending: Internal Medicine | Admitting: Internal Medicine

## 2024-06-07 ENCOUNTER — Other Ambulatory Visit: Payer: Self-pay | Admitting: Adult Health

## 2024-06-07 MED ORDER — LAMOTRIGINE 100 MG PO TABS: 200.0000 mg | ORAL_TABLET | Freq: Two times a day (BID) | ORAL | 0 refills | Status: AC

## 2024-06-07 NOTE — Telephone Encounter (Signed)
Pt. is requesting a refill for lamoTRIgine (LAMICTAL) 100 MG tablet.  Pharmacy: [facility] [phone]

## 2024-06-07 NOTE — Telephone Encounter (Signed)
 Megan's last note stated: --Continue Lamictal  200 mg twice a day   I see that the patient only taking 100mg  BID.   ROUTING TO WORK IN TO MAKE DETERMINATION ON STRENGTH

## 2024-06-11 ENCOUNTER — Telehealth: Payer: Self-pay | Admitting: Adult Health

## 2024-06-11 NOTE — Telephone Encounter (Signed)
 I will call patient to verify medication dosage.  Left voice message

## 2024-06-13 MED ORDER — GABAPENTIN 100 MG PO CAPS: 200.0000 mg | ORAL_CAPSULE | Freq: Every evening | ORAL | 11 refills | Status: DC

## 2024-06-13 NOTE — Addendum Note (Signed)
 Addended by: SHERRYL DUWAINE SQUIBB on: 06/13/2024 08:52 AM   Modules accepted: Orders

## 2024-06-13 NOTE — Telephone Encounter (Signed)
 The patient called back.  She verified that she is taking Lamictal  200 mg twice a day.  She states that she has been taking 200 mg of the gabapentin  and she feels that the higher dose works better for her headaches.  She states therefore she has ran out of the gabapentin .  She is wondering if she can take a higher dose.  She has a follow-up visit in November to establish care with Dr. Gregg. I will go ahead and increase gabapentin  to 200 mg at bedtime.

## 2024-06-14 MED ORDER — GABAPENTIN 100 MG PO CAPS: 200.0000 mg | ORAL_CAPSULE | Freq: Every evening | ORAL | 11 refills | Status: AC

## 2024-06-14 NOTE — Addendum Note (Signed)
 Addended by: NEYSA NENA RAMAN on: 06/14/2024 01:54 PM   Modules accepted: Orders

## 2024-06-14 NOTE — Telephone Encounter (Signed)
 Called [facility] cancelled gabapentin  prescription.  Pt [facility] to [facility] to [facility]. Done.  Gabapentin  100mg  caps, take 2 nightly.

## 2024-06-14 NOTE — Telephone Encounter (Signed)
 Pt states she is having issues with the [facility] pharmacy, she is asking if Duwaine, NP can have her gabapentin  (NEURONTIN ) 100 MG capsule called into [facility] [phone]

## 2024-06-21 ENCOUNTER — Encounter (HOSPITAL_BASED_OUTPATIENT_CLINIC_OR_DEPARTMENT_OTHER): Payer: Self-pay | Admitting: Emergency Medicine

## 2024-06-21 ENCOUNTER — Emergency Department (HOSPITAL_BASED_OUTPATIENT_CLINIC_OR_DEPARTMENT_OTHER)
Admission: EM | Admit: 2024-06-21 | Discharge: 2024-06-21 | Disposition: A | Discharge status: Home / Self Care | Attending: Emergency Medicine | Admitting: Emergency Medicine

## 2024-06-21 ENCOUNTER — Emergency Department (HOSPITAL_BASED_OUTPATIENT_CLINIC_OR_DEPARTMENT_OTHER): Admitting: Radiology

## 2024-06-21 ENCOUNTER — Other Ambulatory Visit: Payer: Self-pay

## 2024-06-21 DIAGNOSIS — M79642 Pain in left hand: Secondary | ICD-10-CM | POA: Insufficient documentation

## 2024-06-21 DIAGNOSIS — Z91010 Allergy to peanuts: Secondary | ICD-10-CM | POA: Insufficient documentation

## 2024-06-21 NOTE — ED Notes (Signed)
 Reviewed AVS/discharge instruction with patient. Time allotted for and all questions answered. Patient is agreeable for d/c and escorted to ed exit by staff.
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
## 2024-06-21 NOTE — Discharge Instructions (Signed)
 Please read and follow all provided instructions.  Your diagnoses today include:  1. Left hand pain     Tests performed today include: An x-ray of the affected area - does NOT show any broken bones Vital signs. See below for your results today.   Medications prescribed:  None  Take any prescribed medications only as directed.  Home care instructions:  Follow any educational materials contained in this packet Follow R.I.C.E. Protocol: R - rest your injury  I  - use ice on injury without applying directly to skin C - compress injury with bandage or splint E - elevate the injury as much as possible  Follow-up instructions: Please follow-up with your primary care provider or the provided orthopedic physician (bone specialist) if you continue to have significant pain in 1 week. In this case you may have a more severe injury that requires further care.   Return instructions:  Please return if your fingers are numb or tingling, appear gray or blue, or you have severe pain (also elevate the arm and loosen splint or wrap if you were given one) Please return to the Emergency Department if you experience worsening symptoms.  Please return if you have any other emergent concerns.  Additional Information:  Your vital signs today were: BP (!) 146/98 (BP Location: Right Arm)   Pulse 93   Temp 98 F (36.7 C) (Oral)   Resp 16   SpO2 96%  If your blood pressure (BP) was elevated above 135/85 this visit, please have this repeated by your doctor within one month. --------------

## 2024-07-25 ENCOUNTER — Ambulatory Visit: Admitting: Neurology

## 2024-07-25 ENCOUNTER — Encounter: Payer: Self-pay | Admitting: Neurology
